# Patient Record
Sex: Male | Born: 1935 | Race: White | Hispanic: No | Marital: Married | Smoking: Former smoker
Health system: Southern US, Community
[De-identification: ages and names within clinical notes are randomized; demographics above are authoritative.]

## PROBLEM LIST (undated history)

## (undated) DIAGNOSIS — I10 Essential (primary) hypertension: Secondary | ICD-10-CM

## (undated) DIAGNOSIS — D649 Anemia, unspecified: Secondary | ICD-10-CM

## (undated) DIAGNOSIS — E039 Hypothyroidism, unspecified: Secondary | ICD-10-CM

## (undated) DIAGNOSIS — K219 Gastro-esophageal reflux disease without esophagitis: Secondary | ICD-10-CM

## (undated) DIAGNOSIS — M4800 Spinal stenosis, site unspecified: Secondary | ICD-10-CM

## (undated) DIAGNOSIS — I499 Cardiac arrhythmia, unspecified: Secondary | ICD-10-CM

## (undated) DIAGNOSIS — E785 Hyperlipidemia, unspecified: Secondary | ICD-10-CM

## (undated) DIAGNOSIS — M1990 Unspecified osteoarthritis, unspecified site: Secondary | ICD-10-CM

## (undated) DIAGNOSIS — C159 Malignant neoplasm of esophagus, unspecified: Secondary | ICD-10-CM

## (undated) DIAGNOSIS — R011 Cardiac murmur, unspecified: Secondary | ICD-10-CM

## (undated) DIAGNOSIS — K5792 Diverticulitis of intestine, part unspecified, without perforation or abscess without bleeding: Secondary | ICD-10-CM

## (undated) DIAGNOSIS — R739 Hyperglycemia, unspecified: Secondary | ICD-10-CM

## (undated) DIAGNOSIS — I2510 Atherosclerotic heart disease of native coronary artery without angina pectoris: Secondary | ICD-10-CM

## (undated) DIAGNOSIS — Z8719 Personal history of other diseases of the digestive system: Secondary | ICD-10-CM

## (undated) HISTORY — PX: CHOLECYSTECTOMY: SHX55

## (undated) HISTORY — PX: BACK SURGERY: SHX140

## (undated) HISTORY — PX: JOINT REPLACEMENT: SHX530

## (undated) HISTORY — PX: ROTATOR CUFF REPAIR: SHX139

## (undated) HISTORY — PX: EYE SURGERY: SHX253

---

## 2006-03-15 ENCOUNTER — Ambulatory Visit: Payer: Self-pay | Admitting: Cardiology

## 2007-08-27 ENCOUNTER — Inpatient Hospital Stay: Payer: Self-pay | Admitting: Internal Medicine

## 2008-02-09 ENCOUNTER — Ambulatory Visit: Payer: Self-pay | Admitting: Gastroenterology

## 2010-02-23 ENCOUNTER — Ambulatory Visit: Payer: Self-pay | Admitting: Ophthalmology

## 2010-03-06 ENCOUNTER — Ambulatory Visit: Payer: Self-pay | Admitting: Ophthalmology

## 2012-05-09 LAB — BASIC METABOLIC PANEL
Anion Gap: 8 (ref 7–16)
Chloride: 100 mmol/L (ref 98–107)
EGFR (African American): >60
EGFR (Non-African Amer.): >60
Glucose: 181 mg/dL — ABNORMAL HIGH (ref 65–99)

## 2012-05-09 LAB — CBC
Platelet: 332 10*3/uL (ref 150–440)
RBC: 3.42 10*6/uL — ABNORMAL LOW (ref 4.40–5.90)
RDW: 13 % (ref 11.5–14.5)
WBC: 10.8 10*3/uL — ABNORMAL HIGH (ref 3.8–10.6)

## 2012-05-09 LAB — TROPONIN I: Troponin-I: <0.02 ng/mL

## 2012-05-10 ENCOUNTER — Observation Stay: Payer: Self-pay | Admitting: Internal Medicine

## 2012-05-10 LAB — CK TOTAL AND CKMB (NOT AT ARMC)
CK, Total: 97 U/L (ref 35–232)
CK-MB: 1.2 ng/mL (ref 0.5–3.6)

## 2012-05-10 LAB — TROPONIN I
Troponin-I: <0.02 ng/mL
Troponin-I: <0.02 ng/mL

## 2012-05-11 LAB — BASIC METABOLIC PANEL
BUN: 18 mg/dL (ref 7–18)
Calcium, Total: 9 mg/dL (ref 8.5–10.1)
Co2: 26 mmol/L (ref 21–32)
Creatinine: 1.35 mg/dL — ABNORMAL HIGH (ref 0.60–1.30)
EGFR (Non-African Amer.): 51 — ABNORMAL LOW
Glucose: 99 mg/dL (ref 65–99)
Osmolality: 278 (ref 275–301)
Sodium: 138 mmol/L (ref 136–145)

## 2012-05-11 LAB — CBC WITH DIFFERENTIAL/PLATELET
Basophil %: 0.2 %
Eosinophil %: 2.1 %
HGB: 10.4 g/dL — ABNORMAL LOW (ref 13.0–18.0)
MCH: 32.9 pg (ref 26.0–34.0)
MCHC: 34.5 g/dL (ref 32.0–36.0)
MCV: 95 fL (ref 80–100)
Monocyte #: 1.1 x10 3/mm — ABNORMAL HIGH (ref 0.2–1.0)
Monocyte %: 11.7 %
Neutrophil #: 6.2 10*3/uL (ref 1.4–6.5)
Neutrophil %: 65.4 %
Platelet: 335 10*3/uL (ref 150–440)
RBC: 3.15 10*6/uL — ABNORMAL LOW (ref 4.40–5.90)
WBC: 9.4 10*3/uL (ref 3.8–10.6)

## 2012-05-11 LAB — LIPID PANEL
Cholesterol: 96 mg/dL (ref 0–200)
HDL Cholesterol: 32 mg/dL — ABNORMAL LOW (ref 40–60)
Triglycerides: 77 mg/dL (ref 0–200)

## 2012-05-15 LAB — CULTURE, BLOOD (SINGLE)

## 2012-06-17 ENCOUNTER — Ambulatory Visit: Payer: Self-pay | Admitting: Gastroenterology

## 2014-08-13 NOTE — Consult Note (Signed)
Brief Consult Note: Diagnosis: Neck and thoracic back pain.   Patient was seen by consultant.   Consult note dictated.   Recommend further assessment or treatment.   Comments: Patient has had pain in his neck and thoracic spine for 5 days.  No known trauma.  Has chronic lumbar back pain treated at a pain management center with steroid injections without relief.  He denies weakness or paresthesias in the upper extremities.  ROM of c spine is limited per the patient and his thoracic pain is exacerbated with deep breathing.  No tenderness midline but rather tenderness is present in the paraspinal muscles bilaterally along the cervical and thoracic spine.  Symptoms may be related to strain, degenerative disc disease seen on cspine plain films or myalgias related to recent URI.  Recommend patient take an anti-inflammatory or steroid taper to help improve symptoms.  PT can be helpful to work on neck ROM.  Electronic Signatures: Thornton Park (MD)  (Signed 18-Jan-14 13:23)  Authored: Brief Consult Note   Last Updated: 18-Jan-14 13:23 by Thornton Park (MD)

## 2014-08-13 NOTE — Consult Note (Signed)
PATIENT NAME:  Eddie Castaneda, Eddie Castaneda MR#:  [id number] DATE OF BIRTH:  07/08/1933  DATE OF CONSULTATION:  05/10/2012  REFERRING PHYSICIAN:   CONSULTING PHYSICIAN:  Dionisio David, MD  PATIENT NAME: Eddie Castaneda.   CONSULTING PHYSICIAN: Dionisio David, M.D.   INDICATION FOR CONSULTATION:  Chest pain.   HISTORY OF PRESENT ILLNESS:  This is a 81 year old white male who came into the hospital with chest pain. I was asked to evaluate the patient because of severe chest pain. The patient states that he actually has been having back pain and intrascapular area pain, as well as some dull ache in the right side of his chest when he takes deep breaths. In fact, he was out of the country and came home and developed rectal bleeding, collapsed and had been diagnosed of GI bleed and was seen by Dr. Ramonita Lab and because of chest pain, in the meantime, he got admitted to the hospital. He denies any left-sided chest pain at this time but does have some right and retrosternal area chest pain when he takes deep breath. No pressure-type chest pain. It is not related to activity.  The past medical history was evaluated by Dr. Saralyn Pilar in the past several years ago and had according to him 50% blockage in 2 arteries. He was last seen by Dr. Saralyn Pilar in December and he was told to come back in a year.   PAST MEDICAL HISTORY:  Besides moderate coronary artery disease, has a history of hypertension. No history of hypercholesterolemia or diabetes. As mentioned, he had just had recently rectal bleed.   SOCIAL HISTORY:  Quit smoking several years ago.   FAMILY HISTORY:  Positive for coronary artery disease.   PHYSICAL EXAMINATION: GENERAL:  He is alert, oriented x 3, in no acute distress.  VITAL SIGNS:  Stable.  HEENT:  Revealed no JVD. LUNGS:  Clear.   HEART:  Regular rate and rhythm. Normal S1, S2. No audible murmur.  ABDOMEN:  Soft, nontender, positive bowel sounds.  EXTREMITIES:  No pedal edema.   NEUROLOGIC:  Appears to be intact.   STUDIES:  EKG shows normal sinus rhythm, 96 beats per minute, nonspecific ST-T changes.   LABORATORY DATA: BUN and creatinine are normal. His glucose is 181. Hemoglobin is 11. Troponin is less than 0.02.   ASSESSMENT AND PLAN:  Atypical chest pain sounds like pleuritic-type of chest pain to me, which is most likely either musculoskeletal or related to underlying maybe bronchitis. He had upper respiratory infection symptoms when he came back from vacation and had rectal bleed but his hemoglobin is 11 right now. This EKG is unremarkable. Cardiac enzymes are negative. He has been evaluated by Dr. Saralyn Pilar in the past. Advised no further workup and just follow up with Dr. Saralyn Pilar. Sounds like musculoskeletal chest pain.     ____________________________ Dionisio David, MD sak:ce D: 05/10/2012 10:04:00 ET T: 05/10/2012 10:33:09 ET JOB#: [id number]  cc: Dionisio David, MD, <Dictator> Dionisio David MD ELECTRONICALLY SIGNED 06/09/2012 9:02

## 2014-08-13 NOTE — Consult Note (Signed)
PATIENT NAME:  Eddie Castaneda, Eddie Castaneda MR#:  [id number] DATE OF BIRTH:  05-Aug-1933  DATE OF CONSULTATION:  05/10/2012  REFERRING PHYSICIAN:   CONSULTING PHYSICIAN:  Timoteo Gaul, MD  REASON FOR CONSULTATION:  Neck and thoracic back pain.   HISTORY OF PRESENT ILLNESS:  The patient is a 81 year old male who is admitted with pain in his chest, back and neck.  The patient states that these symptoms have been going on for approximately 5 days.  He was admitted to the hospital overnight.  The patient states that he was in his usual state of health until returning from [place] on vacation.  On 04/27/2012, he developed an upper respiratory illness and collapsed.  Soon after, he also had a couple days of rectal bleeding.  This had all resolved and the patient was beginning to feel better when he spontaneously developed neck, chest and thoracic back pain which was exacerbated with taking a deep breath.  The patient denies any significant difficulty breathing, however.  There was no recent known injury to the neck or back.  Of note, patient does have chronic low back pain.  He was treated by Dr. Primus Bravo here at [facility] in the pain management department.  The patient has undergone previous corticosteroid injections in his lumbar spine which did not provide him with relief.  The patient takes tramadol at baseline for this.  He is a patient of Dr. Olin Pia.   I reviewed the patient's medical history by his electronic medical record.   PHYSICAL EXAMINATION: GENERAL APPEARANCE:  The patient is alert, oriented, in no acute distress.  He is sitting up in a chair in his hospital room.  The patient has no outward signs of trauma.  The skin overlying his neck and back are intact.  There is no erythema, ecchymosis or swelling.  His range of motion within the cervical spine is limited.  He has approximately 40 degrees of extension, 45 degrees of flexion as well as 45 degrees of rotation to the  right and left.  These symptoms do not cause radicular pain into his upper extremities.  He does not have significant tenderness over the posterior spinous processes of the cervical spine, but has tenderness along the spine on both the right and left sides to palpation diffusely.  There is no palpable masses.  The patient is able to move his upper extremities without pain.  He has intact sensation to light touch throughout the bilateral upper extremities and palpable radial pulses.  The patient did demonstrate some external rotation weakness on the right side compared to the left, but he states this is due to a previous rotator cuff injury.  The patient has 5/5 strength of his subscapularis and deltoid muscles.   His thoracic spine also does not have midline tenderness over the posterior spinous processes, but he has diffuse paraspinal tenderness on either side of his thoracic spine.  He did not have a specific tenderness in the lumbar spine within the midline or the paraspinal muscles.  The pain seems to stop at approximately the junction between the thoracolumbar spine.    RADIOLOGIC STUDIES:  I have reviewed the AP and lateral of the thoracic spine.  This does not show evidence of a significant degenerative change or spondylolisthesis.  There is no compression fracture. The patient appears to have normal kyphosis.  His cervical films, however, demonstrate significant degenerative changes throughout the cervical spine.  He has narrowing of disk spaces between C5  and 6 and C6 and 7.  There is anterior spurring and mild anterolisthesis between C4 and 5. No compression fractures are noted.   ASSESSMENT:  Paraspinal muscle pain involving the cervical and thoracic spine, possibly strain versus myalgias.   PLAN:  The patient is not having any weakness or paresthesias in the upper extremities.  I do not believe he is suffering from a herniated disk at this time.  His neck pain may be coming from a flare-up of the  underlying degenerative disk disease within the cervical spine, but his exam suggests a more muscular origin to the cervical and thoracic spine symptoms.  I would recommend a trial of Flexeril 10 mg q. 8 hours p.r.n. for spasm as well as an oral anti-inflammatory versus a steroid taper.  Given that he is on antibiotics currently, I have elected to start him on Celebrex 200 mg twice daily.  A soft collar may benefit the patient if he does not respond to the medications and needs more mechanical support for his cervical spine.  He would also benefit from a physical therapy evaluation so they can work on range of motion of his C-spine and work on paraspinal muscular strengthening.       ____________________________ Timoteo Gaul, MD klk:ea D: 05/10/2012 13:48:00 ET T: 05/11/2012 04:01:43 ET JOB#: [id number]  cc: Timoteo Gaul, MD, <Dictator> Timoteo Gaul MD ELECTRONICALLY SIGNED 05/19/2012 17:09

## 2014-08-13 NOTE — H&P (Signed)
PATIENT NAME:  Eddie Castaneda, Eddie Castaneda MR#:  [id number] DATE OF BIRTH:  1933/07/31  DATE OF ADMISSION:  05/10/2012  PRIMARY CARE PHYSICIAN: Ramonita Lab, MD  REFERRING PHYSICIAN: Dr. Owens Shark  CHIEF COMPLAINT: Shortness of breath associated with chest pain and upper back pain.   HISTORY OF PRESENT ILLNESS: The patient is reporting that since Tuesday he has been having upper back pain which is new. It is radiating to the middle of his chest. The patient had some acute viral illness last week and he was evaluated by his primary care physician and was given some medications including antibiotics, ciprofloxacin and Flagyl. As the patient is complaining of shortness of breath with deep inspirations, CT angiogram of the chest was done and pulmonary embolism is ruled out. Initial EKG is with no acute ST-T wave changes and cardiac enzymes are negative. The patient denies any photophobia, nausea or vomiting. He has been followed up by Dr. Saralyn Pilar as an outpatient and his last visit was in December 2013, which was normal.   PAST MEDICAL HISTORY: 1. Hypertension. 2. Hyperlipidemia. 3. Arthritis. 4. Spinal stenosis with bulging disk.   PAST SURGICAL HISTORY:  1. Knee replacement. 2. Back surgery. 3. Partial laminectomy in year 1982. 4. Cholecystectomy in year 2003. 5. Rotator cuff repair.   ALLERGIES: MORPHINE.    HOME MEDICATIONS: 1. Tylenol 1 to 2 tablets as needed. 2. Quinapril 40 mg once daily. 3. Prilosec 20 mg 1 tablet once daily. 4. Niaspan ER 1000 mg twice a day. 5. Metoprolol 20 mg p.o. 2 times a day. 6. Lisinopril 10 mg once daily. 7. Flagyl 500 mg p.o. q. 8 hours for 7 days, given by the patient's primary care physician.  8. Fiber tab. The patient needs to resume this in one week.  PSYCHOSOCIAL HISTORY: He lives at home with wife. Denies smoking, alcohol or illicit drug use.   FAMILY HISTORY: Mother had colon cancer and father had brain cancer.   REVIEW OF SYSTEMS:  CONSTITUTIONAL:  Denies any fevers, fatigue or weakness. Complaining of pain in the right side of the chest with deep inspiration. Denies any weight loss or weight gain.   EYES: Denies blurry vision, inflammation, glaucoma, cataracts, allergies surgery.  RESPIRATORY: Denies any COPD. Denies any pneumonia.   CARDIOVASCULAR: Denies palpitations.   BREAST/GYN: Denies any breast mass or vaginal discharge.   ENDOCRINOLOGY: Denies any polyuria, polyphagia, polydipsia or nocturia. Denies any thyroid problems.   HEMATOLOGIC/LYMPHATIC: Denies anemia, thrombocytopenia.   MUSCULOSKELETAL: Complaining of pain in the upper back, in between the shoulder blades. Denies any knee pain or hip pain.   NEUROLOGIC: Denies any numbness, weakness, dysarthria, dementia or ataxia.   PSYCH: Denies any insomnia, ADD or OCD.   PHYSICAL EXAMINATION:  VITAL SIGNS: Temperature 98.6, pulse 95, respirations 18 and blood pressure 195/102.   GENERAL APPEARANCE: Not in acute distress, very uncomfortable from the pain in the shoulder blades, moderately built and moderately nourished.   HEENT: Normocephalic, atraumatic. Pupils are equally reacting to light and accommodation. No cyanosis or tenderness. No nasal congestion. No post nasal drip.   NECK: Supple. No JVD. No carotid bruits. No thyromegaly.   PULMONARY: Lungs have mild rales and rhonchi.   CARDIOVASCULAR: S1 and S2 normal. Regular rate and rhythm. Point of maximum impulse is intact. No palpable anterior chest wall tenderness.  ABDOMEN: Soft. Bowel sounds are positive in all four quadrants. Nontender, nondistended.   NEURO: Alert and oriented x 3. Motor and sensory are grossly intact. Reflexes are 2+.  EXTREMITIES: No edema. No cyanosis.  LABS/RADIOLOGIC STUDIES: CT angiogram of the chest has revealed unremarkable pulmonary arteries and no pulmonary embolism. 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 No pulmonary embolism. Incidental nonacute findings.    Glucose is 181, BUN 18, creatinine 1.14, sodium 134, potassium 4.2, chloride 100, CO2 26, anion gap 8 and GFR greater than 60. CK total 55, CK-MB 0.8 and troponin less than 0.02. WBC is 10.8, hemoglobin 11.0, hematocrit 32.8 and platelet count 332,000. Influenza A and B are negative.  ASSESSMENT AND PLAN: This is a 81 year old coming in with upper back pain radiating to the middle of the chest.  1. Chest pain and shortness of breath. Rule out acute MI. The patient is started on oxygen, nitroglycerin, aspirin, beta blocker and nitroglycerin tablet sublingually q. 5 minutes as needed for chest pain x 3. Cycle cardiac biomarkers. Cardiology consult is placed.  2. Hypertension, uncontrolled. Increase metoprolol dose to 50 mg p.o. q. 12 hours and give Lopressor 5 mg IV on an as needed basis for systolic blood pressure greater than 150.  3. Upper back pain, which is acute in nature. We will get an x-ray of the upper back and consult orthopedics.   CODE STATUS: FULL CODE.  The diagnosis and plan of care was discussed in detail with the patient and the family members at bedside. They voiced their understanding of the plan.   The patient will be turned over to [facility] in the a.m.  TOTAL TIME SPENT ON ADMISSION: 60 minutes. ____________________________ Nicholes Mango, MD ag:sb D: 05/10/2012 03:34:29 ET T: 05/10/2012 15:59:07 ET JOB#: [id number]  cc: Nicholes Mango, MD, <Dictator> Isaias Cowman, MD Adin Hector, MD Nicholes Mango MD ELECTRONICALLY SIGNED 05/24/2012 5:20

## 2014-08-13 NOTE — Discharge Summary (Signed)
PATIENT NAME:  Eddie Castaneda, Eddie Castaneda MR#:  [id number] DATE OF BIRTH:  04/19/1934  DATE OF ADMISSION:  05/10/2012 DATE OF DISCHARGE:  05/11/2012  DISCHARGE DIAGNOSES:  1.  Atypical chest and back pain, most likely musculoskeletal.  2.  Degenerative arthritis involving cervical spine.  3.  Hypertension.  4.  Recent diverticulitis.   CHIEF COMPLAINT: Shortness of breath associated with chest, upper back and neck pain.   HISTORY OF PRESENT ILLNESS: Eddie Castaneda is a 81 year old male with a history of hypertension, hyperlipidemia, osteoarthritis, spinal stenosis and bulging disk, who presented to the ER complaining of upper back pain radiating to the middle of the chest.  The pain was worse when he took deep breaths. He also has been having neck pain and some shortness of breath. The patient underwent a CT angiogram of the chest that was negative for PE. Initial EKG did not show any ST-T changes. Cardiac enzymes were negative. The patient had recently been treated with a course of Cipro and Flagyl for presumed diverticulitis.   PAST MEDICAL HISTORY: Significant for hypertension, hyperlipidemia, arthritis, spinal stenosis, and bulging disk.   PAST SURGICAL HISTORY: Significant for knee replacement, back surgery, partial laminectomy, cholecystectomy, rotator cuff repair.   PHYSICAL EXAMINATION: GENERAL:  Appeared to be in mild distress.  VITAL SIGNS:  Temperature was 98.6, pulse 95, respirations 18, blood pressure initially was 194/102.  HEENT: Normocephalic, atraumatic.  LUNGS: Clear to auscultation.  HEART: S1, S2.  ABDOMEN: Soft, nontender.  EXTREMITIES: No edema.   DIAGNOSTIC DATA:  Glucose 181, BUN 18, creatinine 1.14, sodium 134, potassium 4.0, chloride 100, CO2 at 26.  WBC count 10.8, hemoglobin 11, hematocrit 32.8.   HOSPITAL COURSE:  The patient was seen in consultation by Dr. Mack Guise, Orthopedics, and also by Dr. Humphrey Rolls, cardiologist who felt that the pain was more likely to be  musculoskeletal. Cervical spine x-ray showed degenerative disk disease in the lower cervical spine with minimal anterolisthesis of C4 and C5, most likely degenerative.  Thoracic spine x-ray did not show any acute findings. The patient was also started on Celebrex and cyclobenzaprine p.r.n.  He was given Percocet for pain relief. He started also metoprolol 50 mg q.12 hours for hypertension and was advised to complete his course of Cipro and Flagyl.  During his stay in the hospital, patient symptomatically improved and he was advised to hold his Lipitor for a few weeks to see if the pain got better and was discharged home in stable condition.   DISCHARGE MEDICATIONS:  1.  Quinapril 40 mg once daily. 2.  Metoprolol 25 mg p.o. b.i.d.  3.  Flomax 0.4 mg once daily. 4.  Vitamin C 1000 mg once daily.  5.  Vitamin B12 1500 mcg once daily. 6.  Celebrex 200 mg b.i.d. for 10 days.  7.  Aspirin 325 mg once a day.  8.  Percocet 5/325, 1 tablet p.o. b.i.d. p.r.n. for 10 days.   FOLLOWUP: The patient has been advised to hold his Lipitor and follow up with Dr. Caryl Comes at the [facility] in 1 to 2 weeks' time.   Total time spent in discharging patient : 30 minutes   ____________________________ Tracie Harrier, MD vh:eg D: 05/11/2012 13:53:11 ET T: 05/11/2012 19:54:43 ET JOB#: [id number]  cc: Tracie Harrier, MD, <Dictator> Tracie Harrier MD ELECTRONICALLY SIGNED 06/03/2012 8:41

## 2014-11-04 ENCOUNTER — Encounter
Admission: RE | Admit: 2014-11-04 | Discharge: 2014-11-04 | Disposition: A | Discharge status: Home / Self Care | Payer: Medicare Other | Source: Ambulatory Visit | Attending: Ophthalmology | Admitting: Ophthalmology

## 2014-11-04 DIAGNOSIS — I10 Essential (primary) hypertension: Secondary | ICD-10-CM | POA: Diagnosis not present

## 2014-11-04 DIAGNOSIS — Z01810 Encounter for preprocedural cardiovascular examination: Secondary | ICD-10-CM | POA: Diagnosis not present

## 2014-11-04 DIAGNOSIS — H2512 Age-related nuclear cataract, left eye: Secondary | ICD-10-CM | POA: Diagnosis not present

## 2014-11-05 ENCOUNTER — Encounter: Payer: Self-pay | Admitting: *Deleted

## 2014-11-08 ENCOUNTER — Ambulatory Visit: Payer: Medicare Other | Admitting: Anesthesiology

## 2014-11-08 ENCOUNTER — Ambulatory Visit
Admission: RE | Admit: 2014-11-08 | Discharge: 2014-11-08 | Disposition: A | Discharge status: Home / Self Care | Payer: Medicare Other | Source: Ambulatory Visit | Attending: Ophthalmology | Admitting: Ophthalmology

## 2014-11-08 ENCOUNTER — Encounter: Payer: Self-pay | Admitting: *Deleted

## 2014-11-08 ENCOUNTER — Encounter
Admission: RE | Disposition: A | Discharge status: Home / Self Care | Payer: Self-pay | Source: Ambulatory Visit | Attending: Ophthalmology

## 2014-11-08 DIAGNOSIS — K589 Irritable bowel syndrome without diarrhea: Secondary | ICD-10-CM | POA: Diagnosis not present

## 2014-11-08 DIAGNOSIS — Z85828 Personal history of other malignant neoplasm of skin: Secondary | ICD-10-CM | POA: Diagnosis not present

## 2014-11-08 DIAGNOSIS — Z9849 Cataract extraction status, unspecified eye: Secondary | ICD-10-CM | POA: Diagnosis not present

## 2014-11-08 DIAGNOSIS — Z7982 Long term (current) use of aspirin: Secondary | ICD-10-CM | POA: Diagnosis not present

## 2014-11-08 DIAGNOSIS — Z96659 Presence of unspecified artificial knee joint: Secondary | ICD-10-CM | POA: Insufficient documentation

## 2014-11-08 DIAGNOSIS — Z885 Allergy status to narcotic agent status: Secondary | ICD-10-CM | POA: Diagnosis not present

## 2014-11-08 DIAGNOSIS — Z79891 Long term (current) use of opiate analgesic: Secondary | ICD-10-CM | POA: Diagnosis not present

## 2014-11-08 DIAGNOSIS — Z9049 Acquired absence of other specified parts of digestive tract: Secondary | ICD-10-CM | POA: Insufficient documentation

## 2014-11-08 DIAGNOSIS — G8929 Other chronic pain: Secondary | ICD-10-CM | POA: Diagnosis not present

## 2014-11-08 DIAGNOSIS — Z87891 Personal history of nicotine dependence: Secondary | ICD-10-CM | POA: Diagnosis not present

## 2014-11-08 DIAGNOSIS — I10 Essential (primary) hypertension: Secondary | ICD-10-CM | POA: Insufficient documentation

## 2014-11-08 DIAGNOSIS — M1990 Unspecified osteoarthritis, unspecified site: Secondary | ICD-10-CM | POA: Diagnosis not present

## 2014-11-08 DIAGNOSIS — M4800 Spinal stenosis, site unspecified: Secondary | ICD-10-CM | POA: Insufficient documentation

## 2014-11-08 DIAGNOSIS — H269 Unspecified cataract: Secondary | ICD-10-CM | POA: Diagnosis present

## 2014-11-08 DIAGNOSIS — I2510 Atherosclerotic heart disease of native coronary artery without angina pectoris: Secondary | ICD-10-CM | POA: Diagnosis not present

## 2014-11-08 DIAGNOSIS — Z79899 Other long term (current) drug therapy: Secondary | ICD-10-CM | POA: Diagnosis not present

## 2014-11-08 DIAGNOSIS — Z9889 Other specified postprocedural states: Secondary | ICD-10-CM | POA: Diagnosis not present

## 2014-11-08 DIAGNOSIS — H2512 Age-related nuclear cataract, left eye: Secondary | ICD-10-CM | POA: Insufficient documentation

## 2014-11-08 HISTORY — DX: Anemia, unspecified: D64.9

## 2014-11-08 HISTORY — DX: Diverticulitis of intestine, part unspecified, without perforation or abscess without bleeding: K57.92

## 2014-11-08 HISTORY — DX: Atherosclerotic heart disease of native coronary artery without angina pectoris: I25.10

## 2014-11-08 HISTORY — PX: CATARACT EXTRACTION W/PHACO: SHX586

## 2014-11-08 HISTORY — DX: Gastro-esophageal reflux disease without esophagitis: K21.9

## 2014-11-08 HISTORY — DX: Personal history of other diseases of the digestive system: Z87.19

## 2014-11-08 HISTORY — DX: Cardiac arrhythmia, unspecified: I49.9

## 2014-11-08 HISTORY — DX: Essential (primary) hypertension: I10

## 2014-11-08 HISTORY — DX: Spinal stenosis, site unspecified: M48.00

## 2014-11-08 HISTORY — DX: Unspecified osteoarthritis, unspecified site: M19.90

## 2014-11-08 SURGERY — PHACOEMULSIFICATION, CATARACT, WITH IOL INSERTION
Anesthesia: General | Site: Eye | Laterality: Left | Wound class: Clean

## 2014-11-08 MED ORDER — MOXIFLOXACIN HCL 0.5 % OP SOLN
1.0000 [drp] | OPHTHALMIC | Status: AC
  Administered 2014-11-08 (×3): 1 [drp] via OPHTHALMIC

## 2014-11-08 MED ORDER — NA CHONDROIT SULF-NA HYALURON 40-17 MG/ML IO SOLN
INTRAOCULAR | Status: AC
  Filled 2014-11-08: qty 1

## 2014-11-08 MED ORDER — EPINEPHRINE HCL 1 MG/ML IJ SOLN
INTRAMUSCULAR | Status: AC
  Filled 2014-11-08: qty 2

## 2014-11-08 MED ORDER — LIDOCAINE HCL (PF) 4 % IJ SOLN
INTRAMUSCULAR | Status: DC | PRN
  Administered 2014-11-08: 5 mL via OPHTHALMIC

## 2014-11-08 MED ORDER — HYALURONIDASE HUMAN 150 UNIT/ML IJ SOLN
INTRAMUSCULAR | Status: AC
  Filled 2014-11-08: qty 1

## 2014-11-08 MED ORDER — MOXIFLOXACIN HCL 0.5 % OP SOLN
OPHTHALMIC | Status: DC | PRN
  Administered 2014-11-08: 1 [drp] via OPHTHALMIC

## 2014-11-08 MED ORDER — PHENYLEPHRINE HCL 10 % OP SOLN
1.0000 [drp] | OPHTHALMIC | Status: AC
  Administered 2014-11-08 (×4): 1 [drp] via OPHTHALMIC

## 2014-11-08 MED ORDER — MOXIFLOXACIN HCL 0.5 % OP SOLN
OPHTHALMIC | Status: AC
  Administered 2014-11-08: 1 [drp] via OPHTHALMIC
  Filled 2014-11-08: qty 3

## 2014-11-08 MED ORDER — CYCLOPENTOLATE HCL 2 % OP SOLN
1.0000 [drp] | OPHTHALMIC | Status: AC
  Administered 2014-11-08 (×4): 1 [drp] via OPHTHALMIC

## 2014-11-08 MED ORDER — NA CHONDROIT SULF-NA HYALURON 40-17 MG/ML IO SOLN
INTRAOCULAR | Status: DC | PRN
  Administered 2014-11-08: 1 mL via INTRAOCULAR

## 2014-11-08 MED ORDER — TETRACAINE HCL 0.5 % OP SOLN
OPHTHALMIC | Status: DC | PRN
  Administered 2014-11-08: 1 [drp] via OPHTHALMIC

## 2014-11-08 MED ORDER — SODIUM CHLORIDE 0.9 % IV SOLN
INTRAVENOUS | Status: DC
  Administered 2014-11-08 (×2): via INTRAVENOUS

## 2014-11-08 MED ORDER — BUPIVACAINE HCL (PF) 0.75 % IJ SOLN
INTRAMUSCULAR | Status: AC
  Filled 2014-11-08: qty 10

## 2014-11-08 MED ORDER — CYCLOPENTOLATE HCL 2 % OP SOLN
OPHTHALMIC | Status: AC
  Administered 2014-11-08: 1 [drp] via OPHTHALMIC
  Filled 2014-11-08: qty 2

## 2014-11-08 MED ORDER — TETRACAINE HCL 0.5 % OP SOLN
OPHTHALMIC | Status: AC
  Filled 2014-11-08: qty 2

## 2014-11-08 MED ORDER — CEFUROXIME OPHTHALMIC INJECTION 1 MG/0.1 ML
INJECTION | OPHTHALMIC | Status: DC | PRN
  Administered 2014-11-08: 1 mg via INTRACAMERAL

## 2014-11-08 MED ORDER — MIDAZOLAM HCL 2 MG/2ML IJ SOLN
INTRAMUSCULAR | Status: DC | PRN
  Administered 2014-11-08: 2 mg via INTRAVENOUS

## 2014-11-08 MED ORDER — CARBACHOL 0.01 % IO SOLN
INTRAOCULAR | Status: DC | PRN
  Administered 2014-11-08: 0.5 mL via INTRAOCULAR

## 2014-11-08 MED ORDER — EPINEPHRINE HCL 1 MG/ML IJ SOLN
INTRAOCULAR | Status: DC | PRN
  Administered 2014-11-08: 200 mL

## 2014-11-08 MED ORDER — PHENYLEPHRINE HCL 10 % OP SOLN
OPHTHALMIC | Status: AC
  Administered 2014-11-08: 1 [drp] via OPHTHALMIC
  Filled 2014-11-08: qty 5

## 2014-11-08 MED ORDER — LIDOCAINE HCL (PF) 1 % IJ SOLN
INTRAOCULAR | Status: DC | PRN
  Administered 2014-11-08: 12:00:00 via OPHTHALMIC

## 2014-11-08 MED ORDER — CEFUROXIME OPHTHALMIC INJECTION 1 MG/0.1 ML
INJECTION | OPHTHALMIC | Status: AC
  Filled 2014-11-08: qty 0.1

## 2014-11-08 MED ORDER — ALFENTANIL 500 MCG/ML IJ INJ
INJECTION | INTRAMUSCULAR | Status: DC | PRN
  Administered 2014-11-08: 1000 ug via INTRAVENOUS

## 2014-11-08 MED ORDER — LIDOCAINE HCL (PF) 4 % IJ SOLN
INTRAMUSCULAR | Status: AC
  Filled 2014-11-08: qty 5

## 2014-11-08 SURGICAL SUPPLY — 24 items
CORD BIP STRL DISP 12FT (MISCELLANEOUS) ×3 IMPLANT
DRAPE XRAY CASSETTE 23X24 (DRAPES) ×3 IMPLANT
ERASER HMR WETFIELD 18G (MISCELLANEOUS) ×3 IMPLANT
GLOVE BIO SURGEON STRL SZ8 (GLOVE) ×3 IMPLANT
GLOVE SURG LX 6.5 MICRO (GLOVE) ×2
GLOVE SURG LX 8.0 MICRO (GLOVE) ×2
GLOVE SURG LX STRL 6.5 MICRO (GLOVE) ×1 IMPLANT
GLOVE SURG LX STRL 8.0 MICRO (GLOVE) ×1 IMPLANT
GOWN STRL REUS W/ TWL LRG LVL3 (GOWN DISPOSABLE) ×1 IMPLANT
GOWN STRL REUS W/ TWL XL LVL3 (GOWN DISPOSABLE) ×1 IMPLANT
GOWN STRL REUS W/TWL LRG LVL3 (GOWN DISPOSABLE) ×2
GOWN STRL REUS W/TWL XL LVL3 (GOWN DISPOSABLE) ×2
LENS IOL ACRYSERT 18.0 (Intraocular Lens) ×3 IMPLANT
PACK CATARACT (MISCELLANEOUS) ×3 IMPLANT
PACK CATARACT DINGLEDEIN LX (MISCELLANEOUS) ×3 IMPLANT
PACK EYE AFTER SURG (MISCELLANEOUS) ×3 IMPLANT
SHLD EYE VISITEC  UNIV (MISCELLANEOUS) ×3 IMPLANT
SOL PREP PVP 2OZ (MISCELLANEOUS) ×3
SOLUTION PREP PVP 2OZ (MISCELLANEOUS) ×1 IMPLANT
SUT SILK 5-0 (SUTURE) ×3 IMPLANT
SYR 5ML LL (SYRINGE) ×3 IMPLANT
SYR TB 1ML 27GX1/2 LL (SYRINGE) ×3 IMPLANT
WATER STERILE IRR 1000ML POUR (IV SOLUTION) ×3 IMPLANT
WIPE NON LINTING 3.25X3.25 (MISCELLANEOUS) ×3 IMPLANT

## 2014-11-08 NOTE — H&P (Signed)
  History and physical was faxed and scanned in.   

## 2014-11-08 NOTE — Discharge Instructions (Addendum)
See handout.AMBULATORY SURGERY  DISCHARGE INSTRUCTIONS   1) The drugs that you were given will stay in your system until tomorrow so for the next 24 hours you should not:  A) Drive an automobile B) Make any legal decisions C) Drink any alcoholic beverage   2) You may resume regular meals tomorrow.  Today it is better to start with liquids and gradually work up to solid foods.  You may eat anything you prefer, but it is better to start with liquids, then soup and crackers, and gradually work up to solid foods.   3) Please notify your doctor immediately if you have any unusual bleeding, trouble breathing, redness and pain at the surgery site, drainage, fever, or pain not relieved by medication.    4) Additional Instructions:   Cataract Surgery Care After Refer to this sheet in the next few weeks. These instructions provide you with information on caring for yourself after your procedure. Your caregiver may also give you more specific instructions. Your treatment has been planned according to current medical practices, but problems sometimes occur. Call your caregiver if you have any problems or questions after your procedure.  HOME CARE INSTRUCTIONS   Avoid strenuous activities as directed by your caregiver.  Ask your caregiver when you can resume driving.  Use eyedrops or other medicines to help healing and control pressure inside your eye as directed by your caregiver.  Only take over-the-counter or prescription medicines for pain, discomfort, or fever as directed by your caregiver.  Do not to touch or rub your eyes.  You may be instructed to use a protective shield during the first few days and nights after surgery. If not, wear sunglasses to protect your eyes. This is to protect the eye from pressure or from being accidentally bumped.  Keep the area around your eye clean and dry. Avoid swimming or allowing water to hit you directly in the face while showering. Keep soap and  shampoo out of your eyes.  Do not bend or lift heavy objects. Bending increases pressure in the eye. You can walk, climb stairs, and do light household chores.  Do not put a contact lens into the eye that had surgery until your caregiver says it is okay to do so.  Ask your doctor when you can return to work. This will depend on the kind of work that you do. If you work in a dusty environment, you may be advised to wear protective eyewear for a period of time.  Ask your caregiver when it will be safe to engage in sexual activity.  Continue with your regular eye exams as directed by your caregiver. What to expect:  It is normal to feel itching and mild discomfort for a few days after cataract surgery. Some fluid discharge is also common, and your eye may be sensitive to light and touch.  After 1 to 2 days, even moderate discomfort should disappear. In most cases, healing will take about 6 weeks.  If you received an intraocular lens (IOL), you may notice that colors are very bright or have a blue tinge. Also, if you have been in bright sunlight, everything may appear reddish for a few hours. If you see these color tinges, it is because your lens is clear and no longer cloudy. Within a few months after receiving an IOL, these extra colors should go away. When you have healed, you will probably need new glasses. SEEK MEDICAL CARE IF:   You have increased bruising around your  eye.  You have discomfort not helped by medicine. SEEK IMMEDIATE MEDICAL CARE IF:   You have a fever.  You have a worsening or sudden vision loss.  You have redness, swelling, or increasing pain in the eye.  You have a thick discharge from the eye that had surgery. MAKE SURE YOU:  Understand these instructions.  Will watch your condition.  Will get help right away if you are not doing well or get worse. Document Released: 10/27/2004 Document Revised: 07/02/2011 Document Reviewed: 12/01/2010 [facility] Patient  Information 2015 [place], [place]. This information is not intended to replace advice given to you by your health care provider. Make sure you discuss any questions you have with your health care provider.      Please contact your physician with any problems or Same Day Surgery at [phone], Monday through Friday 6 am to 4 pm, or [place] at [facility] number at [phone].

## 2014-11-08 NOTE — Anesthesia Postprocedure Evaluation (Signed)
  Anesthesia Post-op Note  Patient: Eddie Castaneda  Procedure(s) Performed: Procedure(s) with comments: CATARACT EXTRACTION PHACO AND INTRAOCULAR LENS PLACEMENT (IOC) (Left) - US:01:20 AP:21.8 CDE:31.09 PAC LOT: [id number]  Anesthesia type:General  Patient location: PACU  Post pain: Pain level controlled  Post assessment: Post-op Vital signs reviewed, Patient's Cardiovascular Status Stable, Respiratory Function Stable, Patent Airway and No signs of Nausea or vomiting  Post vital signs: Reviewed and stable  Last Vitals:  Filed Vitals:   11/08/14 1315  BP: 149/83  Pulse: 61  Temp:   Resp: 16    Level of consciousness: awake, alert  and patient cooperative  Complications: No apparent anesthesia complications

## 2014-11-08 NOTE — Op Note (Signed)
Date of Surgery: 11/08/2014 Date of Dictation: 11/08/2014 12:59 PM Pre-operative Diagnosis:  Nuclear Sclerotic Cataract left Eye Post-operative Diagnosis: same Procedure performed: Extra-capsular Cataract Extraction (ECCE) with placement of a posterior chamber intraocular lens (IOL) left Eye IOL:  Implant Name Type Inv. Item Serial No. Manufacturer Lot No. LRB No. Used  LENS IOL ACRYSERT 18.0 - [id number] Intraocular Lens LENS IOL ACRYSERT 18.0 [id number] ALCON   Left 1   Anesthesia: 2% Lidocaine and 4% Marcaine in a 50/50 mixture with 10 unites/ml of Hylenex given as a peribulbar Anesthesiologist: Anesthesiologist: Andria Frames, MD CRNA: Sinda Du, CRNA Complications: none Estimated Blood Loss: less than 1 ml  Description of procedure:  The patient was given anesthesia and sedation via intravenous access. The patient was then prepped and draped in the usual fashion. A 25-gauge needle was bent for initiating the capsulorhexis. A 5-0 silk suture was placed through the conjunctiva superior and inferiorly to serve as bridle sutures. Hemostasis was obtained at the superior limbus using an eraser cautery. A partial thickness groove was made at the anterior surgical limbus with a 64 Beaver blade and this was dissected anteriorly with an [employer]. The anterior chamber was entered at 10 o'clock with a 1.0 mm paracentesis knife and through the lamellar dissection with a 2.6 mm Alcon keratome. Epi-Shugarcaine 0.5 CC [9 cc BSS Plus (Alcon), 3 cc 4% preservative-free lidocaine (Hospira) and 4 cc 1:1000 preservative-free, bisulfite-free epinephrine] was injected via the paracentesis tract. DiscoVisc was injected to replace the aqueous and a continuous tear curvilinear capsulorhexis was performed using a bent 25-gauge needle.  Balance salt on a syringe was used to perform hydro-dissection and phacoemulsification was carried out using a divide and conquer technique. Procedure(s) with  comments: CATARACT EXTRACTION PHACO AND INTRAOCULAR LENS PLACEMENT ([facility]) (Left) - US:01:20 AP:21.8 CDE:31.09 PAC LOT: [id number]. Irrigation/aspiration was used to remove the residual cortex and the capsular bag was inflated with DiscoVisc. The intraocular lens was inserted into the capsular bag using a pre-loaded Acrysert Delivery System. Irrigation/aspiration was used to remove the residual DiscoVisc. The wound was inflated with balanced salt and checked for leaks. None were found. Miostat was injected via the paracentesis track and 0.1 ml of cefuroxime containing 1 mg of drug  was injected via the paracentesis track. The wound was checked for leaks again and none were found.   The bridal sutures were removed and two drops of Vigamox were placed on the eye. An eye shield was placed to protect the eye and the patient was discharged to the recovery area in good condition.   Queena Monrreal MD

## 2014-11-08 NOTE — Interval H&P Note (Signed)
History and Physical Interval Note:  11/08/2014 12:18 PM  Eddie Castaneda  has presented today for surgery, with the diagnosis of CATARACT  The various methods of treatment have been discussed with the patient and family. After consideration of risks, benefits and other options for treatment, the patient has consented to  Procedure(s) with comments: CATARACT EXTRACTION PHACO AND INTRAOCULAR LENS PLACEMENT ([place]) (Left) - [place]: AP: CDE: PAC LOT: [id number] as a surgical intervention .  The patient's history has been reviewed, patient examined, no change in status, stable for surgery.  I have reviewed the patient's chart and labs.  Questions were answered to the patient's satisfaction.     Deasiah Hagberg

## 2014-11-08 NOTE — Transfer of Care (Signed)
Immediate Anesthesia Transfer of Care Note  Patient: Eddie Castaneda  Procedure(s) Performed: Procedure(s) with comments: CATARACT EXTRACTION PHACO AND INTRAOCULAR LENS PLACEMENT ([facility]) (Left) - US:01:20 AP:21.8 CDE:31.09 PAC LOT: [id number]  Patient Location: PACU  Anesthesia Type:MAC  Level of Consciousness: awake  Airway & Oxygen Therapy: Patient Spontanous Breathing  Post-op Assessment: Report given to RN  Post vital signs: Reviewed  Last Vitals:  Filed Vitals:   11/08/14 1120  BP: 169/85  Pulse: 72  Temp: 36.9 C  Resp: 16    Complications: No apparent anesthesia complications

## 2014-11-08 NOTE — Anesthesia Preprocedure Evaluation (Addendum)
Anesthesia Evaluation  Patient identified by MRN, date of birth, ID band Patient awake    Reviewed: Allergy & Precautions, H&P , NPO status , Patient's Chart, lab work & pertinent test results, reviewed documented beta blocker date and time   Airway Mallampati: II  TM Distance: >3 FB Neck ROM: full    Dental  (+) Missing, Upper Dentures, Lower Dentures   Pulmonary former smoker,  breath sounds clear to auscultation  Pulmonary exam normal       Cardiovascular Exercise Tolerance: Good hypertension, + CAD and + Past MI Normal cardiovascular exam+ dysrhythmias Rhythm:regular Rate:Normal     Neuro/Psych negative neurological ROS  negative psych ROS   GI/Hepatic Neg liver ROS, hiatal hernia, GERD-  Controlled,  Endo/Other  negative endocrine ROS  Renal/GU negative Renal ROS  negative genitourinary   Musculoskeletal  (+) Arthritis -,   Abdominal   Peds  Hematology negative hematology ROS (+)   Anesthesia Other Findings Past Medical History:   History of hiatal hernia                                     GERD (gastroesophageal reflux disease)                       Arthritis                                                    Anemia                                                       Dysrhythmia                                                  Hypertension                                                 Coronary artery disease                                      Diverticulitis                                               Spinal stenosis                                              Reproductive/Obstetrics negative OB ROS  Anesthesia Physical Anesthesia Plan  ASA: III  Anesthesia Plan: General   Post-op Pain Management:    Induction:   Airway Management Planned:   Additional Equipment:   Intra-op Plan:   Post-operative Plan:   Informed Consent: I have  reviewed the patients History and Physical, chart, labs and discussed the procedure including the risks, benefits and alternatives for the proposed anesthesia with the patient or authorized representative who has indicated his/her understanding and acceptance.   Dental Advisory Given  Plan Discussed with: Anesthesiologist, CRNA and Surgeon  Anesthesia Plan Comments:         Anesthesia Quick Evaluation

## 2015-08-07 ENCOUNTER — Emergency Department: Payer: Medicare Other

## 2015-08-07 ENCOUNTER — Encounter: Payer: Self-pay | Admitting: Emergency Medicine

## 2015-08-07 ENCOUNTER — Emergency Department
Admission: EM | Admit: 2015-08-07 | Discharge: 2015-08-07 | Disposition: A | Discharge status: Home / Self Care | Payer: Medicare Other | Attending: Emergency Medicine | Admitting: Emergency Medicine

## 2015-08-07 DIAGNOSIS — Y929 Unspecified place or not applicable: Secondary | ICD-10-CM | POA: Insufficient documentation

## 2015-08-07 DIAGNOSIS — K219 Gastro-esophageal reflux disease without esophagitis: Secondary | ICD-10-CM | POA: Diagnosis not present

## 2015-08-07 DIAGNOSIS — Z7982 Long term (current) use of aspirin: Secondary | ICD-10-CM | POA: Insufficient documentation

## 2015-08-07 DIAGNOSIS — Y999 Unspecified external cause status: Secondary | ICD-10-CM | POA: Diagnosis not present

## 2015-08-07 DIAGNOSIS — W1809XA Striking against other object with subsequent fall, initial encounter: Secondary | ICD-10-CM | POA: Insufficient documentation

## 2015-08-07 DIAGNOSIS — I2510 Atherosclerotic heart disease of native coronary artery without angina pectoris: Secondary | ICD-10-CM | POA: Diagnosis not present

## 2015-08-07 DIAGNOSIS — S0990XA Unspecified injury of head, initial encounter: Secondary | ICD-10-CM | POA: Diagnosis not present

## 2015-08-07 DIAGNOSIS — Y939 Activity, unspecified: Secondary | ICD-10-CM | POA: Diagnosis not present

## 2015-08-07 DIAGNOSIS — I10 Essential (primary) hypertension: Secondary | ICD-10-CM | POA: Diagnosis not present

## 2015-08-07 DIAGNOSIS — S0191XA Laceration without foreign body of unspecified part of head, initial encounter: Secondary | ICD-10-CM | POA: Diagnosis present

## 2015-08-07 MED ORDER — LIDOCAINE-EPINEPHRINE (PF) 1 %-1:200000 IJ SOLN
INTRAMUSCULAR | Status: DC
Start: 2015-08-07 — End: 2015-08-07
  Filled 2015-08-07: qty 60

## 2015-08-07 MED ORDER — LIDOCAINE-EPINEPHRINE-TETRACAINE (LET) SOLUTION
NASAL | Status: AC
  Administered 2015-08-07: 3 mL
  Filled 2015-08-07: qty 3

## 2015-08-07 MED ORDER — LIDOCAINE-EPINEPHRINE-TETRACAINE (LET) SOLUTION
NASAL | Status: DC
Start: 2015-08-07 — End: 2015-08-07
  Filled 2015-08-07: qty 15

## 2015-08-07 MED ORDER — HYDROCODONE-ACETAMINOPHEN 5-325 MG PO TABS
1.0000 | ORAL_TABLET | Freq: Once | ORAL | Status: AC
  Administered 2015-08-07: 1 via ORAL
  Filled 2015-08-07: qty 1

## 2015-08-07 MED ORDER — HYDROMORPHONE HCL 1 MG/ML IJ SOLN
1.0000 mg | Freq: Once | INTRAMUSCULAR | Status: AC
  Administered 2015-08-07: 1 mg via INTRAVENOUS
  Filled 2015-08-07: qty 1

## 2015-08-07 MED ORDER — HYDROCODONE-ACETAMINOPHEN 5-325 MG PO TABS: 1.0000 | ORAL_TABLET | Freq: Four times a day (QID) | ORAL | Status: DC | PRN

## 2015-08-07 MED ORDER — ONDANSETRON HCL 4 MG/2ML IJ SOLN
4.0000 mg | Freq: Once | INTRAMUSCULAR | Status: AC
  Administered 2015-08-07: 4 mg via INTRAVENOUS
  Filled 2015-08-07: qty 2

## 2015-08-07 NOTE — Discharge Instructions (Signed)
Concussion, Adult A concussion, or closed-head injury, is a brain injury caused by a direct blow to the head or by a quick and sudden movement (jolt) of the head or neck. Concussions are usually not life-threatening. Even so, the effects of a concussion can be serious. If you have had a concussion before, you are more likely to experience concussion-like symptoms after a direct blow to the head.  CAUSES  Direct blow to the head, such as from running into another player during a soccer game, being hit in a fight, or hitting your head on a hard surface.  A jolt of the head or neck that causes the brain to move back and forth inside the skull, such as in a car crash. SIGNS AND SYMPTOMS The signs of a concussion can be hard to notice. Early on, they may be missed by you, family members, and health care providers. You may look fine but act or feel differently. Symptoms are usually temporary, but they may last for days, weeks, or even longer. Some symptoms may appear right away while others may not show up for hours or days. Every head injury is different. Symptoms include:  Mild to moderate headaches that will not go away.  A feeling of pressure inside your head.  Having more trouble than usual:  Learning or remembering things you have heard.  Answering questions.  Paying attention or concentrating.  Organizing daily tasks.  Making decisions and solving problems.  Slowness in thinking, acting or reacting, speaking, or reading.  Getting lost or being easily confused.  Feeling tired all the time or lacking energy (fatigued).  Feeling drowsy.  Sleep disturbances.  Sleeping more than usual.  Sleeping less than usual.  Trouble falling asleep.  Trouble sleeping (insomnia).  Loss of balance or feeling lightheaded or dizzy.  Nausea or vomiting.  Numbness or tingling.  Increased sensitivity to:  Sounds.  Lights.  Distractions.  Vision problems or eyes that tire  easily.  Diminished sense of taste or smell.  Ringing in the ears.  Mood changes such as feeling sad or anxious.  Becoming easily irritated or angry for little or no reason.  Lack of motivation.  Seeing or hearing things other people do not see or hear (hallucinations). DIAGNOSIS Your health care provider can usually diagnose a concussion based on a description of your injury and symptoms. He or she will ask whether you passed out (lost consciousness) and whether you are having trouble remembering events that happened right before and during your injury. Your evaluation might include:  A brain scan to look for signs of injury to the brain. Even if the test shows no injury, you may still have a concussion.  Blood tests to be sure other problems are not present. TREATMENT  Concussions are usually treated in an emergency department, in urgent care, or at a clinic. You may need to stay in the hospital overnight for further treatment.  Tell your health care provider if you are taking any medicines, including prescription medicines, over-the-counter medicines, and natural remedies. Some medicines, such as blood thinners (anticoagulants) and aspirin, may increase the chance of complications. Also tell your health care provider whether you have had alcohol or are taking illegal drugs. This information may affect treatment.  Your health care provider will send you home with important instructions to follow.  How fast you will recover from a concussion depends on many factors. These factors include how severe your concussion is, what part of your brain was injured,  your age, and how healthy you were before the concussion. °· Most people with mild injuries recover fully. Recovery can take time. In general, recovery is slower in older persons. Also, persons who have had a concussion in the past or have other medical problems may find that it takes longer to recover from their current injury. °HOME  CARE INSTRUCTIONS °General Instructions °· Carefully follow the directions your health care provider gave you. °· Only take over-the-counter or prescription medicines for pain, discomfort, or fever as directed by your health care provider. °· Take only those medicines that your health care provider has approved. °· Do not drink alcohol until your health care provider says you are well enough to do so. Alcohol and certain other drugs may slow your recovery and can put you at risk of further injury. °· If it is harder than usual to remember things, write them down. °· If you are easily distracted, try to do one thing at a time. For example, do not try to watch TV while fixing dinner. °· Talk with family members or close friends when making important decisions. °· Keep all follow-up appointments. Repeated evaluation of your symptoms is recommended for your recovery. °· Watch your symptoms and tell others to do the same. Complications sometimes occur after a concussion. Older adults with a brain injury may have a higher risk of serious complications, such as a blood clot on the brain. °· Tell your teachers, school nurse, school counselor, coach, athletic trainer, or work manager about your injury, symptoms, and restrictions. Tell them about what you can or cannot do. They should watch for: °¨ Increased problems with attention or concentration. °¨ Increased difficulty remembering or learning new information. °¨ Increased time needed to complete tasks or assignments. °¨ Increased irritability or decreased ability to cope with stress. °¨ Increased symptoms. °· Rest. Rest helps the brain to heal. Make sure you: °¨ Get plenty of sleep at night. Avoid staying up late at night. °¨ Keep the same bedtime hours on weekends and weekdays. °¨ Rest during the day. Take daytime naps or rest breaks when you feel tired. °· Limit activities that require a lot of thought or concentration. These include: °¨ Doing homework or job-related  work. °¨ Watching TV. °¨ Working on the computer. °· Avoid any situation where there is potential for another head injury (football, hockey, soccer, basketball, martial arts, downhill snow sports and horseback riding). Your condition will get worse every time you experience a concussion. You should avoid these activities until you are evaluated by the appropriate follow-up health care providers. °Returning To Your Regular Activities °You will need to return to your normal activities slowly, not all at once. You must give your body and brain enough time for recovery. °· Do not return to sports or other athletic activities until your health care provider tells you it is safe to do so. °· Ask your health care provider when you can drive, ride a bicycle, or operate heavy machinery. Your ability to react may be slower after a brain injury. Never do these activities if you are dizzy. °· Ask your health care provider about when you can return to work or school. °Preventing Another Concussion °It is very important to avoid another brain injury, especially before you have recovered. In rare cases, another injury can lead to permanent brain damage, brain swelling, or death. The risk of this is greatest during the first 7-10 days after a head injury. Avoid injuries by: °· Wearing a   seat belt when riding in a car.  Drinking alcohol only in moderation.  Wearing a helmet when biking, skiing, skateboarding, skating, or doing similar activities.  Avoiding activities that could lead to a second concussion, such as contact or recreational sports, until your health care provider says it is okay.  Taking safety measures in your home.  Remove clutter and tripping hazards from floors and stairways.  Use grab bars in bathrooms and handrails by stairs.  Place non-slip mats on floors and in bathtubs.  Improve lighting in dim areas. SEEK MEDICAL CARE IF:  You have increased problems paying attention or  concentrating.  You have increased difficulty remembering or learning new information.  You need more time to complete tasks or assignments than before.  You have increased irritability or decreased ability to cope with stress.  You have more symptoms than before. Seek medical care if you have any of the following symptoms for more than 2 weeks after your injury:  Lasting (chronic) headaches.  Dizziness or balance problems.  Nausea.  Vision problems.  Increased sensitivity to noise or light.  Depression or mood swings.  Anxiety or irritability.  Memory problems.  Difficulty concentrating or paying attention.  Sleep problems.  Feeling tired all the time. SEEK IMMEDIATE MEDICAL CARE IF:  You have severe or worsening headaches. These may be a sign of a blood clot in the brain.  You have weakness (even if only in one hand, leg, or part of the face).  You have numbness.  You have decreased coordination.  You vomit repeatedly.  You have increased sleepiness.  One pupil is larger than the other.  You have convulsions.  You have slurred speech.  You have increased confusion. This may be a sign of a blood clot in the brain.  You have increased restlessness, agitation, or irritability.  You are unable to recognize people or places.  You have neck pain.  It is difficult to wake you up.  You have unusual behavior changes.  You lose consciousness. MAKE SURE YOU:  Understand these instructions.  Will watch your condition.  Will get help right away if you are not doing well or get worse.   This information is not intended to replace advice given to you by your health care provider. Make sure you discuss any questions you have with your health care provider.   Document Released: 06/30/2003 Document Revised: 04/30/2014 Document Reviewed: 10/30/2012 Elsevier Interactive Patient Education 2016 [place], [place], or Adhesive Wound  Closure Health care providers use stitches (sutures), staples, and certain glue (skin adhesives) to hold skin together while it heals (wound closure). You may need this treatment after you have surgery or if you cut your skin accidentally. These methods help your skin to heal more quickly and make it less likely that you will have a scar. A wound may take several months to heal completely. The type of wound you have determines when your wound gets closed. In most cases, the wound is closed as soon as possible (primary skin closure). Sometimes, closure is delayed so the wound can be cleaned and allowed to heal naturally. This reduces the chance of infection. Delayed closure may be needed if your wound:  Is caused by a bite.  Happened more than 6 hours ago.  Involves loss of skin or the tissues under the skin.  Has dirt or debris in it that cannot be removed.  Is infected. WHAT ARE THE DIFFERENT KINDS OF WOUND CLOSURES? There are  many options for wound closure. The one that your health care provider uses depends on how deep and how large your wound is. Adhesive Glue To use this type of glue to close a wound, your health care provider holds the edges of the wound together and paints the glue on the surface of your skin. You may need more than one layer of glue. Then the wound may be covered with a light bandage (dressing). This type of skin closure may be used for small wounds that are not deep (superficial). Using glue for wound closure is less painful than other methods. It does not require a medicine that numbs the area (local anesthetic). This method also leaves nothing to be removed. Adhesive glue is often used for children and on facial wounds. Adhesive glue cannot be used for wounds that are deep, uneven, or bleeding. It is not used inside of a wound.  Adhesive Strips These strips are made of sticky (adhesive), porous paper. They are applied across your skin edges like a regular adhesive  bandage. You leave them on until they fall off. Adhesive strips may be used to close very superficial wounds. They may also be used along with sutures to improve the closure of your skin edges.  Sutures Sutures are the oldest method of wound closure. Sutures can be made from natural substances, such as silk, or from synthetic materials, such as nylon and steel. They can be made from a material that your body can break down as your wound heals (absorbable), or they can be made from a material that needs to be removed from your skin (nonabsorbable). They come in many different strengths and sizes. Your health care provider attaches the sutures to a steel needle on one end. Sutures can be passed through your skin, or through the tissues beneath your skin. Then they are tied and cut. Your skin edges may be closed in one continuous stitch or in separate stitches. Sutures are strong and can be used for all kinds of wounds. Absorbable sutures may be used to close tissues under the skin. The disadvantage of sutures is that they may cause skin reactions that lead to infection. Nonabsorbable sutures need to be removed. Staples When surgical staples are used to close a wound, the edges of your skin on both sides of the wound are brought close together. A staple is placed across the wound, and an instrument secures the edges together. Staples are often used to close surgical cuts (incisions). Staples are faster to use than sutures, and they cause less skin reaction. Staples need to be removed using a tool that bends the staples away from your skin. HOW DO I CARE FOR MY WOUND CLOSURE?  Take medicines only as directed by your health care provider.  If you were prescribed an antibiotic medicine for your wound, finish it all even if you start to feel better.  Use ointments or creams only as directed by your health care provider.  Wash your hands with soap and water before and after touching your wound.  Do not  soak your wound in water. Do not take baths, swim, or use a hot tub until your health care provider approves.  Ask your health care provider when you can start showering. Cover your wound if directed by your health care provider.  Do not take out your own sutures or staples.  Do not pick at your wound. Picking can cause an infection.  Keep all follow-up visits as directed by your health  care provider. This is important. HOW LONG WILL I HAVE MY WOUND CLOSURE?  Leave adhesive glue on your skin until the glue peels away.  Leave adhesive strips on your skin until the strips fall off.  Absorbable sutures will dissolve within several days.  Nonabsorbable sutures and staples must be removed. The location of the wound will determine how long they stay in. This can range from several days to a couple of weeks. WHEN SHOULD I SEEK HELP FOR MY WOUND CLOSURE? Contact your health care provider if:  You have a fever.  You have chills.  You have drainage, redness, swelling, or pain at your wound.  There is a bad smell coming from your wound.  The skin edges of your wound start to separate after your sutures have been removed.  Your wound becomes thick, raised, and darker in color after your sutures come out (scarring).   This information is not intended to replace advice given to you by your health care provider. Make sure you discuss any questions you have with your health care provider.   Document Released: 01/02/2001 Document Revised: 04/30/2014 Document Reviewed: 09/16/2013 Elsevier Interactive Patient Education 2016 [employer].  Please see if your bandages alone for the next 24 hours. Drink plenty of fluids and apply ice to the area of swelling. After 24 hours she can remove her dressings on your head laceration believe the dressings on yourand the outside surface of your right eye. Clean the wound around the stitches with hydrogen peroxide and apply Neosporin with vitamin ointment.  Suture removal should that occur until 7-10 days. The Steri-Strips will grow up and fall off on their own.

## 2015-08-07 NOTE — ED Provider Notes (Signed)
Time Seen: Approximately 1620 I have reviewed the triage notes  Chief Complaint: Fall and Laceration   History of Present Illness: Eddie Castaneda is a 82 y.o. male who presents after a fall today at a family function. He apparently had just got SOME CONCRETE STEPS AND FELL FORWARD STRIKING HIS HEAD AND NASAL AREA. HE STATES HE HAD A PREVIOUS WOUND ON HIS RIGHT LEG WHICH WAS A SKIN BIOPSY VERSUS INFECTED. HE STATES THAT THE WOUND IS ALSO PULLED APART. HE DENIES ANY LOSS OF CONSCIOUSNESS. HE DENIES ANY VISUAL DISTURBANCES. HE PRESENTS WITH A LARGE HEAD LACERATION AND RIGHT-SIDED FACIAL SWELLING. HE DENIES ANY NECK PAIN OR WEAKNESS.   Past Medical History  Diagnosis Date  . History of hiatal hernia   . GERD (gastroesophageal reflux disease)   . Arthritis   . Anemia   . Dysrhythmia   . Hypertension   . Coronary artery disease   . Diverticulitis   . Spinal stenosis     There are no active problems to display for this patient.   Past Surgical History  Procedure Laterality Date  . Back surgery    . Eye surgery    . Cholecystectomy    . Joint replacement Right     Knee  . Rotator cuff repair Right   . Cataract extraction w/phaco Left 11/08/2014    Procedure: CATARACT EXTRACTION PHACO AND INTRAOCULAR LENS PLACEMENT (IOC);  Surgeon: Estill Cotta, MD;  Location: [facility] ORS;  Service: Ophthalmology;  Laterality: Left;  US:01:20 AP:21.8 CDE:31.09 PAC LOT: [id number]    Past Surgical History  Procedure Laterality Date  . Back surgery    . Eye surgery    . Cholecystectomy    . Joint replacement Right     Knee  . Rotator cuff repair Right   . Cataract extraction w/phaco Left 11/08/2014    Procedure: CATARACT EXTRACTION PHACO AND INTRAOCULAR LENS PLACEMENT (IOC);  Surgeon: Estill Cotta, MD;  Location: [facility] ORS;  Service: Ophthalmology;  Laterality: Left;  US:01:20 AP:21.8 CDE:31.09 PAC LOT: [id number]    Current Outpatient Rx  Name  Route  Sig  Dispense  Refill  .  acetaminophen (TYLENOL) 650 MG CR tablet   Oral   Take 650 mg by mouth every 8 (eight) hours as needed for pain.         Marland Kitchen aspirin 81 MG tablet   Oral   Take 81 mg by mouth daily.         Marland Kitchen atorvastatin (LIPITOR) 10 MG tablet   Oral   Take 10 mg by mouth daily.         Marland Kitchen HYDROcodone-acetaminophen (NORCO) 5-325 MG tablet   Oral   Take 1 tablet by mouth every 6 (six) hours as needed for moderate pain.   20 tablet   0   . metoprolol tartrate (LOPRESSOR) 25 MG tablet   Oral   Take 25 mg by mouth daily.         Marland Kitchen omeprazole (PRILOSEC) 20 MG capsule   Oral   Take 20 mg by mouth daily.         . quinapril (ACCUPRIL) 40 MG tablet   Oral   Take 40 mg by mouth 1 day or 1 dose.         . traMADol (ULTRAM-ER) 100 MG 24 hr tablet   Oral   Take 100 mg by mouth 2 (two) times daily. With lunch and dinner         . traMADol (ULTRAM-ER)  300 MG 24 hr tablet   Oral   Take 300 mg by mouth daily. AM dose           Allergies:  Morphine and related  Family History: History reviewed. No pertinent family history.  Social History: Social History  Substance Use Topics  . Smoking status: Former Research scientist (life sciences)  . Smokeless tobacco: None  . Alcohol Use: None     Review of Systems:   10 point review of systems was performed and was otherwise negative:  Constitutional: No fever Eyes: No visual disturbances ENT: No sore throat, ear pain Cardiac: No chest pain Respiratory: No shortness of breath, wheezing, or stridor Abdomen: No abdominal pain, no vomiting, No diarrhea Endocrine: No weight loss, No night sweats Extremities: No peripheral edema, cyanosis Skin: No rashes, easy bruising Neurologic: No focal weakness, trouble with speech or swollowing Urologic: No dysuria, Hematuria, or urinary frequency   Physical Exam:  ED Triage Vitals  Enc Vitals Group     BP 08/07/15 1543 177/84 mmHg     Pulse Rate 08/07/15 1543 80     Resp 08/07/15 1543 17     Temp --      Temp  src --      SpO2 08/07/15 1543 100 %     Weight 08/07/15 1543 178 lb (80.74 kg)     Height 08/07/15 1543 5\' 11"  (1.803 m)     Head Cir --      Peak Flow --      Pain Score 08/07/15 1544 4     Pain Loc --      Pain Edu? --      Excl. in [place]? --     General: Awake , Alert , and Oriented times 3; GCS 15 Head: 8 cm linear laceration above the right eye. There is a 1 cm laceration located toward the outside area of the corner of his eye. It does not involve the canthus. The patient's laceration on his forehead region is an abrasion/laceration skin tear. He also has an avulsion injury to centimeters on the bridge of his nose. Does not appear to be an open fracture. The skin and some subcutaneous tissue is missing at this time with small amount of active bleeding Eyes: Large amount of ecchymosis surrounding the right eye with no entrapment with full range of motion of his extraocular eye movements. His pupils are equal round reactive to light I sent contact is clear or deviation of the septum.  Nose/Throat: No nasal drainage, patent upper airway without erythema or exudate. No bleeding from the nasal canal.  Negative for hemotympanum  Neck: Supple, Full range of motion, No anterior adenopathy or palpable thyroid masses Lungs: Clear to ascultation without wheezes , rhonchi, or rales Heart: Regular rate, regular rhythm without murmurs , gallops , or rubs Abdomen: Soft, non tender without rebound, guarding , or rigidity; bowel sounds positive and symmetric in all 4 quadrants. No organomegaly .        Extremities patient has a 2 cm surgical wound located vertical direction that shows the skin has split apart with no active bleeding at this time. Neurologic: normal ambulation, Motor symmetric without deficits, sensory intact Skin: warm, dry, no rashes   Radiology:   EXAM: CT HEAD WITHOUT CONTRAST  CT MAXILLOFACIAL WITHOUT CONTRAST  TECHNIQUE: Multidetector CT imaging of the head and  maxillofacial structures were performed using the standard protocol without intravenous contrast. Multiplanar CT image reconstructions of the maxillofacial structures were also generated.  COMPARISON: None.  FINDINGS: CT HEAD FINDINGS  No evidence for acute infarction, hemorrhage, mass lesion, hydrocephalus, or extra-axial fluid. Generalized atrophy. Hypoattenuation of white matter, likely chronic microvascular ischemic change. Tiny chronic lacune RIGHT caudate.  Large RIGHT frontal scalp hematoma. Vascular calcification. No underlying skull fracture.  CT MAXILLOFACIAL FINDINGS  No facial fracture is evident. There is no sinus air-fluid level. Dentures.TMJs are located. Maxilla and mandible appear intact.  No orbital hematoma. No blowout injury. BILATERAL cataract extraction. Mild preseptal periorbital soft tissue swelling related to the hematoma.  Chronic nasal septal deviation RIGHT to LEFT of 4 mm with spurring. Mild BILATERAL infundibular narrowing.  Visualized upper cervical region shows no acute findings. Mild pannus. Mild OPLL. No nasopharyngeal fluid  IMPRESSION: Atrophy and small vessel disease. No acute intracranial findings.  Large RIGHT frontal scalp hematoma without skull fracture or contrecoup injury.  No facial fracture is evident. Chronic RIGHT-to-LEFT nasal septal deviation.  RIGHT preseptal periorbital soft tissue swelling without orbital hematoma or blowout injury.   Electronically Signed    I personally reviewed the radiologic studies   Procedures:  The surgical lack that had the skin infection that spread apart on his right leg was Steri-Stripped and I felt closure at this time would be a risk for infection. No current active bleeding 1 cm laceration outside the right corner of his eye had Steri-Strips applied it appears to be skin only with no active bleeding  Laceration repair of above the 8 cm facial wound LACERATION  REPAIR Performed by: Daymon Larsen Authorized by: Daymon Larsen Consent: Verbal consent obtained. Risks and benefits: risks, benefits and alternatives were discussed Consent given by: patient Patient identity confirmed: provided demographic data Prepped and Draped in normal sterile fashion Wound explored  Laceration Location: Right forehead  Laceration Length: 8 cm  No Foreign Bodies seen or palpated  Anesthesia: local infiltration  Local anesthetic: lidocaine 1% with epinephrine  Anesthetic total: 8 ml  Irrigation method: syringe Amount of cleaning: standard  Skin closure: 2. Closure ; 3 deep 5-0 Vicryl sutures  8 interrupted 6-0 Ethilon sutures   Number of sutures: Total 11 stitches   Technique: Layered technique   Patient tolerance: Patient tolerated the procedure well with no immediate complications.    ED Course:  Patient's stay here was uneventful and applied Gelfoam to the areas to the bridge of his nose and also over the surface of the abrasions noted otherwise is laceration. They have been advised to leave these dressings in place for at least 24 hours. Wound instructions was given to the patient along with the family at the bedside. I prescribed Norco for pain and he was also given head injury instructions.    Assessment: Complicated facial laceration Acute closed head injury   Final Clinical Impression:  Final diagnoses:  Traumatic head injury with multiple lacerations, initial encounter     Plan:  Outpatient management            Daymon Larsen, MD 08/07/15 1940

## 2015-08-07 NOTE — ED Notes (Signed)
Pt arrived to ED by EMS after falling at a family function. Per EMS pt has 3 inch lac to forehead, a small lac to right eyelid, an abrasion to the bridge of his nose, an abrasion on his rt wrist and fingers and a re-injury to his left ankle where he recently had skin cancer removed and complications from the stitches. Per EMS no LOC, pupils E&R and no vision changes.

## 2015-08-07 NOTE — ED Notes (Signed)
Patient given wound care supplied per MD orders.

## 2016-12-20 ENCOUNTER — Other Ambulatory Visit: Payer: Self-pay | Admitting: Gastroenterology

## 2016-12-20 DIAGNOSIS — R1310 Dysphagia, unspecified: Secondary | ICD-10-CM

## 2016-12-25 ENCOUNTER — Ambulatory Visit
Admission: RE | Admit: 2016-12-25 | Discharge: 2016-12-25 | Disposition: A | Discharge status: Home / Self Care | Payer: Medicare Other | Source: Ambulatory Visit | Attending: Gastroenterology | Admitting: Gastroenterology

## 2016-12-25 DIAGNOSIS — K449 Diaphragmatic hernia without obstruction or gangrene: Secondary | ICD-10-CM | POA: Insufficient documentation

## 2016-12-25 DIAGNOSIS — R1310 Dysphagia, unspecified: Secondary | ICD-10-CM | POA: Diagnosis not present

## 2017-05-22 ENCOUNTER — Encounter: Payer: Self-pay | Admitting: *Deleted

## 2017-05-23 ENCOUNTER — Ambulatory Visit
Admission: RE | Admit: 2017-05-23 | Discharge: 2017-05-23 | Disposition: A | Discharge status: Home / Self Care | Payer: Medicare Other | Source: Ambulatory Visit | Attending: Gastroenterology | Admitting: Gastroenterology

## 2017-05-23 ENCOUNTER — Ambulatory Visit: Payer: Medicare Other | Admitting: Anesthesiology

## 2017-05-23 ENCOUNTER — Encounter: Payer: Self-pay | Admitting: Anesthesiology

## 2017-05-23 ENCOUNTER — Encounter
Admission: RE | Disposition: A | Discharge status: Home / Self Care | Payer: Self-pay | Source: Ambulatory Visit | Attending: Gastroenterology

## 2017-05-23 DIAGNOSIS — R1013 Epigastric pain: Secondary | ICD-10-CM | POA: Diagnosis present

## 2017-05-23 DIAGNOSIS — Z79899 Other long term (current) drug therapy: Secondary | ICD-10-CM | POA: Insufficient documentation

## 2017-05-23 DIAGNOSIS — Z7982 Long term (current) use of aspirin: Secondary | ICD-10-CM | POA: Insufficient documentation

## 2017-05-23 DIAGNOSIS — R1310 Dysphagia, unspecified: Secondary | ICD-10-CM | POA: Insufficient documentation

## 2017-05-23 DIAGNOSIS — C155 Malignant neoplasm of lower third of esophagus: Secondary | ICD-10-CM | POA: Insufficient documentation

## 2017-05-23 DIAGNOSIS — Z8719 Personal history of other diseases of the digestive system: Secondary | ICD-10-CM | POA: Diagnosis not present

## 2017-05-23 DIAGNOSIS — Z87891 Personal history of nicotine dependence: Secondary | ICD-10-CM | POA: Insufficient documentation

## 2017-05-23 DIAGNOSIS — K219 Gastro-esophageal reflux disease without esophagitis: Secondary | ICD-10-CM | POA: Diagnosis not present

## 2017-05-23 DIAGNOSIS — M1990 Unspecified osteoarthritis, unspecified site: Secondary | ICD-10-CM | POA: Insufficient documentation

## 2017-05-23 DIAGNOSIS — Z885 Allergy status to narcotic agent status: Secondary | ICD-10-CM | POA: Diagnosis not present

## 2017-05-23 DIAGNOSIS — K449 Diaphragmatic hernia without obstruction or gangrene: Secondary | ICD-10-CM | POA: Insufficient documentation

## 2017-05-23 DIAGNOSIS — E039 Hypothyroidism, unspecified: Secondary | ICD-10-CM | POA: Diagnosis not present

## 2017-05-23 DIAGNOSIS — I2510 Atherosclerotic heart disease of native coronary artery without angina pectoris: Secondary | ICD-10-CM | POA: Diagnosis not present

## 2017-05-23 DIAGNOSIS — I10 Essential (primary) hypertension: Secondary | ICD-10-CM | POA: Insufficient documentation

## 2017-05-23 DIAGNOSIS — E785 Hyperlipidemia, unspecified: Secondary | ICD-10-CM | POA: Diagnosis not present

## 2017-05-23 DIAGNOSIS — K317 Polyp of stomach and duodenum: Secondary | ICD-10-CM | POA: Diagnosis not present

## 2017-05-23 HISTORY — PX: ESOPHAGOGASTRODUODENOSCOPY (EGD) WITH PROPOFOL: SHX5813

## 2017-05-23 HISTORY — DX: Hypothyroidism, unspecified: E03.9

## 2017-05-23 HISTORY — DX: Hyperlipidemia, unspecified: E78.5

## 2017-05-23 HISTORY — DX: Hyperglycemia, unspecified: R73.9

## 2017-05-23 SURGERY — ESOPHAGOGASTRODUODENOSCOPY (EGD) WITH PROPOFOL
Anesthesia: General

## 2017-05-23 MED ORDER — METOPROLOL TARTRATE 5 MG/5ML IV SOLN
INTRAVENOUS | Status: AC
Start: 2017-05-23 — End: ?
  Filled 2017-05-23: qty 5

## 2017-05-23 MED ORDER — SODIUM CHLORIDE 0.9 % IV SOLN: INTRAVENOUS | Status: DC

## 2017-05-23 MED ORDER — LIDOCAINE HCL (PF) 2 % IJ SOLN
INTRAMUSCULAR | Status: AC
Start: 2017-05-23 — End: ?
  Filled 2017-05-23: qty 10

## 2017-05-23 MED ORDER — PHENYLEPHRINE HCL 10 MG/ML IJ SOLN
INTRAMUSCULAR | Status: AC
  Filled 2017-05-23: qty 1

## 2017-05-23 MED ORDER — FENTANYL CITRATE (PF) 100 MCG/2ML IJ SOLN
INTRAMUSCULAR | Status: AC
  Filled 2017-05-23: qty 2

## 2017-05-23 MED ORDER — SODIUM CHLORIDE 0.9 % IV SOLN
INTRAVENOUS | Status: DC
  Administered 2017-05-23: 09:00:00 via INTRAVENOUS

## 2017-05-23 MED ORDER — FENTANYL CITRATE (PF) 100 MCG/2ML IJ SOLN
INTRAMUSCULAR | Status: DC | PRN
  Administered 2017-05-23: 50 ug via INTRAVENOUS

## 2017-05-23 MED ORDER — PHENYLEPHRINE HCL 10 MG/ML IJ SOLN
INTRAMUSCULAR | Status: DC | PRN
  Administered 2017-05-23 (×3): 50 ug via INTRAVENOUS

## 2017-05-23 MED ORDER — PROPOFOL 500 MG/50ML IV EMUL
INTRAVENOUS | Status: AC
  Filled 2017-05-23: qty 50

## 2017-05-23 MED ORDER — PROPOFOL 500 MG/50ML IV EMUL
INTRAVENOUS | Status: DC | PRN
  Administered 2017-05-23: 100 ug/kg/min via INTRAVENOUS

## 2017-05-23 MED ORDER — METOPROLOL TARTRATE 5 MG/5ML IV SOLN
INTRAVENOUS | Status: DC | PRN
  Administered 2017-05-23: 1 mg via INTRAVENOUS

## 2017-05-23 NOTE — Anesthesia Procedure Notes (Signed)
Performed by: Cook-Martin, Liandro Thelin Pre-anesthesia Checklist: Patient identified, Emergency Drugs available, Suction available, Patient being monitored and Timeout performed Patient Re-evaluated:Patient Re-evaluated prior to induction Oxygen Delivery Method: Nasal cannula Preoxygenation: Pre-oxygenation with 100% oxygen Induction Type: IV induction Airway Equipment and Method: Bite block Placement Confirmation: positive ETCO2 and CO2 detector       

## 2017-05-23 NOTE — Op Note (Addendum)
[facility] Gastroenterology Patient Name: Glenda Kunst Procedure Date: 05/23/2017 9:33 AM MRN: [id number] Account #: [id number] Date of Birth: February 15, 1934 Admit Type: Outpatient Age: 84 Room: [facility] ENDO ROOM 1 Gender: Male Note Status: Finalized Procedure:            Upper GI endoscopy Indications:          Epigastric abdominal pain, Dysphagia, Odynophagia Providers:            Lollie Sails, MD Referring MD:         Ramonita Lab, MD (Referring MD) Medicines:            Monitored Anesthesia Care Complications:        No immediate complications. Procedure:            Pre-Anesthesia Assessment:                       - ASA Grade Assessment: II - A patient with mild                        systemic disease.                       After obtaining informed consent, the endoscope was                        passed under direct vision. Throughout the procedure,                        the patient's blood pressure, pulse, and oxygen                        saturations were monitored continuously. The Endoscope                        was introduced through the mouth, and advanced to the                        third part of duodenum. The upper GI endoscopy was                        accomplished without difficulty. The patient tolerated                        the procedure well. Findings:      A medium-sized, fungating mass with no bleeding and no stigmata, though       friable of recent bleeding was found in the distal esophagus extending       from about 35-37 cm from the incisors. The mass was non-obstructing,       partially obstructing, not circumferential. Biopsies were taken with a       cold forceps for histology.      A small to medium hiatal hernia was present.      A single 4 mm sessile polyp with no bleeding and no stigmata of recent       bleeding was found on the anterior wall of the gastric body. Biopsies       were taken with a cold forceps for histology.      The cardia and gastric fundus were normal on retroflexion.      The exam of the stomach was otherwise normal.  The examined duodenum was normal. 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 Biopsied.                       - Small to medium hiatal hernia.                       - A single gastric polyp. Biopsied.                       - Normal examined duodenum. Recommendation:       - Discharge patient to home.                       - Full liquid diet today, then advance as tolerated to                        advance diet as tolerated.                       - Await pathology results.                       - Continue present medications. Procedure Code(s):    --- Professional ---                       [phone], Esophagogastroduodenoscopy, flexible, transoral;                        with biopsy, single or multiple Diagnosis Code(s):    --- Professional ---                       D49.0, Neoplasm of unspecified behavior of digestive                        system                       K44.9, Diaphragmatic hernia without obstruction or                        gangrene                       K31.7, Polyp of stomach and duodenum                       R10.13, Epigastric pain                       R13.10, Dysphagia, unspecified CPT copyright 2016 American Medical Association. All rights reserved. The codes documented in this report are preliminary and upon coder review may  be revised to meet current compliance requirements. Lollie Sails, MD 05/23/2017 10:07:51 AM This report has been signed electronically. Number of Addenda: 0 Note Initiated On: 05/23/2017 9:33 AM      [facility]

## 2017-05-23 NOTE — Anesthesia Preprocedure Evaluation (Signed)
Anesthesia Evaluation  Patient identified by MRN, date of birth, ID band Patient awake    Reviewed: Allergy & Precautions, H&P , NPO status , Patient's Chart, lab work & pertinent test results, reviewed documented beta blocker date and time   History of Anesthesia Complications Negative for: history of anesthetic complications  Airway Mallampati: I  TM Distance: >3 FB Neck ROM: full    Dental  (+) Edentulous Upper, Edentulous Lower   Pulmonary neg shortness of breath, neg sleep apnea, neg COPD, neg recent URI, former smoker,           Cardiovascular Exercise Tolerance: Good hypertension, (-) angina+ CAD  (-) Past MI, (-) Cardiac Stents and (-) CABG + dysrhythmias + Valvular Problems/Murmurs      Neuro/Psych negative neurological ROS  negative psych ROS   GI/Hepatic Neg liver ROS, hiatal hernia, GERD  ,  Endo/Other  neg diabetesHypothyroidism   Renal/GU negative Renal ROS  negative genitourinary   Musculoskeletal   Abdominal   Peds  Hematology  (+) anemia ,   Anesthesia Other Findings Past Medical History: No date: Anemia No date: Arthritis No date: Coronary artery disease No date: Diverticulitis No date: Dysrhythmia No date: GERD (gastroesophageal reflux disease) No date: History of hiatal hernia No date: Hyperglycemia No date: Hyperlipidemia No date: Hypertension No date: Hypothyroidism No date: Spinal stenosis   Reproductive/Obstetrics negative OB ROS                             Anesthesia Physical Anesthesia Plan  ASA: II  Anesthesia Plan: General   Post-op Pain Management:    Induction: Intravenous  PONV Risk Score and Plan: 2 and Propofol infusion  Airway Management Planned: Nasal Cannula  Additional Equipment:   Intra-op Plan:   Post-operative Plan:   Informed Consent: I have reviewed the patients History and Physical, chart, labs and discussed the procedure  including the risks, benefits and alternatives for the proposed anesthesia with the patient or authorized representative who has indicated his/her understanding and acceptance.   Dental Advisory Given  Plan Discussed with: Anesthesiologist, CRNA and Surgeon  Anesthesia Plan Comments:         Anesthesia Quick Evaluation

## 2017-05-23 NOTE — Anesthesia Post-op Follow-up Note (Signed)
Anesthesia QCDR form completed.        

## 2017-05-23 NOTE — Anesthesia Postprocedure Evaluation (Signed)
Anesthesia Post Note  Patient: Eddie Castaneda  Procedure(s) Performed: ESOPHAGOGASTRODUODENOSCOPY (EGD) WITH PROPOFOL (N/A )  Patient location during evaluation: Endoscopy Anesthesia Type: General Level of consciousness: awake and alert Pain management: pain level controlled Vital Signs Assessment: post-procedure vital signs reviewed and stable Respiratory status: spontaneous breathing, nonlabored ventilation, respiratory function stable and patient connected to nasal cannula oxygen Cardiovascular status: blood pressure returned to baseline and stable Postop Assessment: no apparent nausea or vomiting Anesthetic complications: no     Last Vitals:  Vitals:   05/23/17 1000 05/23/17 1010  BP: 107/90 107/90  Pulse: 99 (!) 102  Resp: (!) 21 20  Temp:    SpO2: 100% 100%    Last Pain:  Vitals:   05/23/17 0950  TempSrc: Tympanic                 Martha Clan

## 2017-05-23 NOTE — Transfer of Care (Signed)
Immediate Anesthesia Transfer of Care Note  Patient: Eddie Castaneda  Procedure(s) Performed: ESOPHAGOGASTRODUODENOSCOPY (EGD) WITH PROPOFOL (N/A )  Patient Location: PACU  Anesthesia Type:General  Level of Consciousness: awake, alert , oriented and sedated  Airway & Oxygen Therapy: Patient Spontanous Breathing and Patient connected to nasal cannula oxygen  Post-op Assessment: Report given to RN and Post -op Vital signs reviewed and stable  Post vital signs: Reviewed and stable  Last Vitals:  Vitals:   05/23/17 0849  BP: (!) 149/88  Pulse: (!) 119  Resp: 18  Temp: (!) 36.3 C  SpO2: 100%    Last Pain:  Vitals:   05/23/17 0849  TempSrc: Tympanic         Complications: No apparent anesthesia complications

## 2017-05-23 NOTE — H&P (Signed)
Outpatient short stay form Pre-procedure 05/23/2017 9:26 AM Lollie Sails MD  Primary Physician: Dr. Ramonita Lab  Reason for visit: EGD  History of present illness: Patient is a 84 year old male presenting today as above.  For about the past 4 months or so he has had difficulties with odynophagia.  There is no dysphagia he has no difficulty swallowing foods however it seems that when foods at the bottom of the esophagus it hurts for just an instantaneous time and then it passes.  This is intermittent in nature.  He does not regurgitate foods.  He has had a barium swallow which indicates no stenosis or stricture however he does have a small to moderate sized hiatal hernia that is sliding in nature.  Takes no aspirin or blood thinning agent.    Current Facility-Administered Medications:  .  0.9 %  sodium chloride infusion, , Intravenous, Continuous, Lollie Sails, MD, Last Rate: 20 mL/hr at 05/23/17 0918 .  0.9 %  sodium chloride infusion, , Intravenous, Continuous, Lollie Sails, MD  Medications Prior to Admission  Medication Sig Dispense Refill Last Dose  . acetaminophen (TYLENOL) 650 MG CR tablet Take 650 mg by mouth every 8 (eight) hours as needed for pain.   05/22/2017 at Unknown time  . ascorbic acid (VITAMIN C) 1000 MG tablet Take 1,000 mg by mouth daily.   05/18/2017  . atorvastatin (LIPITOR) 10 MG tablet Take 10 mg by mouth daily.   05/22/2017 at Unknown time  . docusate sodium (COLACE) 100 MG capsule Take 100 mg by mouth daily.   05/22/2017 at Unknown time  . levothyroxine (SYNTHROID, LEVOTHROID) 100 MCG tablet Take 25 mcg by mouth daily before breakfast.   05/22/2017 at Unknown time  . metoprolol tartrate (LOPRESSOR) 25 MG tablet Take 25 mg by mouth daily.   05/22/2017 at Unknown time  . Multiple Vitamin (MULTIVITAMIN) tablet Take 1 tablet by mouth daily.   05/18/2017  . Omega-3 1000 MG CAPS Take 340-1,000 capsules by mouth 2 (two) times daily.   05/18/2017  . omeprazole  (PRILOSEC) 20 MG capsule Take 20 mg by mouth daily.   05/22/2017 at Unknown time  . predniSONE (DELTASONE) 5 MG tablet Take 5 mg by mouth daily with breakfast.   05/22/2017 at Unknown time  . Probiotic Product (PROBIOTIC COMPLEX ACIDOPHILUS PO) Take by mouth.   05/18/2017  . quinapril (ACCUPRIL) 40 MG tablet Take 40 mg by mouth 1 day or 1 dose.   05/23/2017 at 5:30 am  . tamsulosin (FLOMAX) 0.4 MG CAPS capsule Take 0.4 mg by mouth daily.   05/22/2017 at Unknown time  . vitamin B-12 (CYANOCOBALAMIN) 1000 MCG tablet Take 1,000 mcg by mouth daily.   05/18/2017  . aspirin 81 MG tablet Take 81 mg by mouth daily.   05/18/2017  . BACILLUS COAGULANS-INULIN PO Take by mouth.   Not Taking at Unknown time  . HYDROcodone-acetaminophen (NORCO) 5-325 MG tablet Take 1 tablet by mouth every 6 (six) hours as needed for moderate pain. (Patient not taking: Reported on 05/23/2017) 20 tablet 0 Not Taking at Unknown time  . traMADol (ULTRAM-ER) 100 MG 24 hr tablet Take 50 mg by mouth 2 (two) times daily. With lunch and dinner    11/07/2014 at Unknown time  . traMADol (ULTRAM-ER) 300 MG 24 hr tablet Take 300 mg by mouth daily. AM dose   05/18/2017     Allergies  Allergen Reactions  . Morphine And Related Nausea Only     Past Medical  History:  Diagnosis Date  . Anemia   . Arthritis   . Coronary artery disease   . Diverticulitis   . Dysrhythmia   . GERD (gastroesophageal reflux disease)   . History of hiatal hernia   . Hyperglycemia   . Hyperlipidemia   . Hypertension   . Hypothyroidism   . Spinal stenosis     Review of systems:      Physical Exam    Heart and lungs: Regular rate and rhythm without rub or gallop, lungs are bilaterally clear.    HEENT: Normocephalic atraumatic eyes are anicteric    Other:    Pertinant exam for procedure: Soft nontender nondistended bowel sounds positive normoactive    Planned proceedures: EGD and indicated procedures. I have discussed the risks benefits and  complications of procedures to include not limited to bleeding, infection, perforation and the risk of sedation and the patient wishes to proceed.    Lollie Sails, MD Gastroenterology 05/23/2017  9:26 AM

## 2017-05-24 ENCOUNTER — Other Ambulatory Visit: Payer: Self-pay | Admitting: Pathology

## 2017-05-24 ENCOUNTER — Other Ambulatory Visit: Payer: Self-pay | Admitting: Gastroenterology

## 2017-05-24 DIAGNOSIS — C159 Malignant neoplasm of esophagus, unspecified: Secondary | ICD-10-CM

## 2017-05-24 HISTORY — DX: Malignant neoplasm of esophagus, unspecified: C15.9

## 2017-05-27 ENCOUNTER — Telehealth: Payer: Self-pay

## 2017-05-27 NOTE — Telephone Encounter (Signed)
Received call from [facility] with new referral, esophageal cancer. PET requested and consult arranged with Dr. Janese Banks following. [facility] is notifying of appointment and sending notes. Oncology Nurse Navigator Documentation  Navigator Location: [facility] (05/27/17 0800)   )Navigator Encounter Type: Telephone (05/27/17 0800) Telephone: Incoming Call (05/27/17 0800)                                                  Time Spent with Patient: 15 (05/27/17 0800)

## 2017-05-28 ENCOUNTER — Ambulatory Visit
Admission: RE | Admit: 2017-05-28 | Discharge: 2017-05-28 | Disposition: A | Discharge status: Home / Self Care | Payer: Medicare Other | Source: Ambulatory Visit | Attending: Gastroenterology | Admitting: Gastroenterology

## 2017-05-28 DIAGNOSIS — C159 Malignant neoplasm of esophagus, unspecified: Secondary | ICD-10-CM | POA: Diagnosis not present

## 2017-05-28 DIAGNOSIS — I2510 Atherosclerotic heart disease of native coronary artery without angina pectoris: Secondary | ICD-10-CM | POA: Insufficient documentation

## 2017-05-28 DIAGNOSIS — I700 Atherosclerosis of aorta: Secondary | ICD-10-CM | POA: Insufficient documentation

## 2017-05-28 LAB — GLUCOSE, CAPILLARY: Glucose-Capillary: 87 mg/dL (ref 65–99)

## 2017-05-28 MED ORDER — FLUDEOXYGLUCOSE F - 18 (FDG) INJECTION
12.0000 | Freq: Once | INTRAVENOUS | Status: AC | PRN
  Administered 2017-05-28: 13.77 via INTRAVENOUS

## 2017-05-29 DIAGNOSIS — Z7982 Long term (current) use of aspirin: Secondary | ICD-10-CM | POA: Insufficient documentation

## 2017-05-29 DIAGNOSIS — K449 Diaphragmatic hernia without obstruction or gangrene: Secondary | ICD-10-CM | POA: Insufficient documentation

## 2017-05-29 DIAGNOSIS — Z87891 Personal history of nicotine dependence: Secondary | ICD-10-CM | POA: Insufficient documentation

## 2017-05-29 DIAGNOSIS — Z800 Family history of malignant neoplasm of digestive organs: Secondary | ICD-10-CM | POA: Insufficient documentation

## 2017-05-29 DIAGNOSIS — Z8719 Personal history of other diseases of the digestive system: Secondary | ICD-10-CM | POA: Insufficient documentation

## 2017-05-29 DIAGNOSIS — R739 Hyperglycemia, unspecified: Secondary | ICD-10-CM | POA: Insufficient documentation

## 2017-05-29 DIAGNOSIS — E039 Hypothyroidism, unspecified: Secondary | ICD-10-CM | POA: Insufficient documentation

## 2017-05-29 DIAGNOSIS — D649 Anemia, unspecified: Secondary | ICD-10-CM | POA: Insufficient documentation

## 2017-05-29 DIAGNOSIS — Z9049 Acquired absence of other specified parts of digestive tract: Secondary | ICD-10-CM | POA: Insufficient documentation

## 2017-05-29 DIAGNOSIS — E785 Hyperlipidemia, unspecified: Secondary | ICD-10-CM | POA: Insufficient documentation

## 2017-05-29 DIAGNOSIS — Z79899 Other long term (current) drug therapy: Secondary | ICD-10-CM | POA: Insufficient documentation

## 2017-05-29 DIAGNOSIS — M4800 Spinal stenosis, site unspecified: Secondary | ICD-10-CM | POA: Insufficient documentation

## 2017-05-29 DIAGNOSIS — I10 Essential (primary) hypertension: Secondary | ICD-10-CM | POA: Insufficient documentation

## 2017-05-29 DIAGNOSIS — M129 Arthropathy, unspecified: Secondary | ICD-10-CM | POA: Insufficient documentation

## 2017-05-29 DIAGNOSIS — I2510 Atherosclerotic heart disease of native coronary artery without angina pectoris: Secondary | ICD-10-CM | POA: Insufficient documentation

## 2017-05-29 DIAGNOSIS — Z510 Encounter for antineoplastic radiation therapy: Secondary | ICD-10-CM | POA: Insufficient documentation

## 2017-05-29 DIAGNOSIS — R599 Enlarged lymph nodes, unspecified: Secondary | ICD-10-CM | POA: Insufficient documentation

## 2017-05-29 DIAGNOSIS — K219 Gastro-esophageal reflux disease without esophagitis: Secondary | ICD-10-CM | POA: Insufficient documentation

## 2017-05-29 DIAGNOSIS — C155 Malignant neoplasm of lower third of esophagus: Secondary | ICD-10-CM | POA: Insufficient documentation

## 2017-05-30 ENCOUNTER — Encounter: Payer: Self-pay | Admitting: Oncology

## 2017-05-30 ENCOUNTER — Ambulatory Visit
Admission: RE | Admit: 2017-05-30 | Discharge: 2017-05-30 | Disposition: A | Discharge status: Home / Self Care | Payer: Medicare Other | Source: Ambulatory Visit | Attending: Radiation Oncology | Admitting: Radiation Oncology

## 2017-05-30 ENCOUNTER — Inpatient Hospital Stay: Payer: Medicare Other | Attending: Oncology | Admitting: Oncology

## 2017-05-30 VITALS — BP 134/74 | HR 70 | Temp 98.1°F | Resp 18 | Ht 71.0 in | Wt 184.0 lb

## 2017-05-30 DIAGNOSIS — C770 Secondary and unspecified malignant neoplasm of lymph nodes of head, face and neck: Secondary | ICD-10-CM | POA: Insufficient documentation

## 2017-05-30 DIAGNOSIS — Z7189 Other specified counseling: Secondary | ICD-10-CM

## 2017-05-30 DIAGNOSIS — Z5111 Encounter for antineoplastic chemotherapy: Secondary | ICD-10-CM | POA: Insufficient documentation

## 2017-05-30 DIAGNOSIS — R599 Enlarged lymph nodes, unspecified: Secondary | ICD-10-CM | POA: Diagnosis not present

## 2017-05-30 DIAGNOSIS — D649 Anemia, unspecified: Secondary | ICD-10-CM | POA: Diagnosis not present

## 2017-05-30 DIAGNOSIS — C155 Malignant neoplasm of lower third of esophagus: Secondary | ICD-10-CM

## 2017-05-30 DIAGNOSIS — C159 Malignant neoplasm of esophagus, unspecified: Secondary | ICD-10-CM

## 2017-05-30 NOTE — Progress Notes (Signed)
Met with Eddie Castaneda and his family during and after consults with Dr. Luiz Ochoa. Introduced nurse navigator services and provided contact information for future needs. Educated further on port a cath. Chemo class and Dietician scheduled at this visit. Oncology Nurse Navigator Documentation  Navigator Location: [facility] (05/30/17 1400)   )Navigator Encounter Type: Initial MedOnc;Initial RadOnc (05/30/17 1400)                 Multidisiplinary Clinic Type: GI (05/30/17 1400)   Patient Visit Type: MedOnc;RadOnc;Initial (05/30/17 1400) Treatment Phase: Pre-Tx/Tx Discussion (05/30/17 1400) Barriers/Navigation Needs: Education (05/30/17 1400)                Acuity: Level 2 (05/30/17 1400)         Time Spent with Patient: 30 (05/30/17 1400)

## 2017-05-30 NOTE — Consult Note (Signed)
NEW PATIENT EVALUATION  Name: Eddie Castaneda  MRN: [id number]  Date:   05/30/2017     DOB: April 19, 1934   This 84 y.o. 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  REFERRING PHYSICIAN: Adin Hector, MD  CHIEF COMPLAINT: No chief complaint on file.   DIAGNOSIS: There were no encounter diagnoses.   PREVIOUS INVESTIGATIONS:  Pathology reports reviewed Endoscopy report reviewed Clinical notes reviewed Pathology reports reviewed PET CT scan reviewed Case reported at weekly tumor board  HPI: Patient is a 84 year old male who presented with some increased dysphasia and odynophagia. He stated he felt food was sticking in the lower portion of esophagus. Barium swallow did not demonstrate significant stricture. He underwent upper endoscopy showing a medium-size fungating mass in the distal esophagus extending from 35-37 cm from the incisors. It was partially obstructing. 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 Patient underwent a PET CT scan again demonstrating hypermetabolic mass in the distal esophagus with an SUV of 10.7. Interestingly enough he had a hypermetabolic left periaortic lymph node actually 2 lymph nodes with significant hypermetabolic activity. Patient has lost very little weight is an excellent performance status for his age. He's been seen by medical oncology and I been asked to evaluate him for possible concurrent chemoradiation.  PLANNED TREATMENT REGIMEN: Concurrent chemoradiation  PAST MEDICAL HISTORY:  has a past medical history of Anemia, Arthritis, Coronary artery disease, Diverticulitis, Dysrhythmia, GERD (gastroesophageal reflux disease), History of hiatal hernia, Hyperglycemia, Hyperlipidemia, Hypertension, Hypothyroidism, and Spinal stenosis.    PAST SURGICAL HISTORY:  Past Surgical History:   Procedure Laterality Date  . BACK SURGERY    . CATARACT EXTRACTION W/PHACO Left 11/08/2014   Procedure: CATARACT EXTRACTION PHACO AND INTRAOCULAR LENS PLACEMENT (IOC);  Surgeon: Estill Cotta, MD;  Location: [facility] ORS;  Service: Ophthalmology;  Laterality: Left;  US:01:20 AP:21.8 CDE:31.09 PAC LOT: [id number]  . CHOLECYSTECTOMY    . ESOPHAGOGASTRODUODENOSCOPY (EGD) WITH PROPOFOL N/A 05/23/2017   Procedure: ESOPHAGOGASTRODUODENOSCOPY (EGD) WITH PROPOFOL;  Surgeon: Lollie Sails, MD;  Location: [facility] ENDOSCOPY;  Service: Endoscopy;  Laterality: N/A;  . EYE SURGERY    . JOINT REPLACEMENT Right    Knee  . ROTATOR CUFF REPAIR Right     FAMILY HISTORY: family history includes Colon cancer in his mother.  SOCIAL HISTORY:  reports that he quit smoking about 28 years ago. he has never used smokeless tobacco. He reports that he does not drink alcohol or use drugs.  ALLERGIES: Morphine and related  MEDICATIONS:  Current Outpatient Medications  Medication Sig Dispense Refill  . acetaminophen (TYLENOL) 650 MG CR tablet Take 650 mg by mouth every 8 (eight) hours as needed for pain.    Marland Kitchen ascorbic acid (VITAMIN C) 1000 MG tablet Take 1,000 mg by mouth daily.    Marland Kitchen aspirin 81 MG tablet Take 81 mg by mouth daily.    Marland Kitchen atorvastatin (LIPITOR) 10 MG tablet Take 10 mg by mouth daily.    Marland Kitchen BACILLUS COAGULANS-INULIN PO Take by mouth.    . docusate sodium (COLACE) 100 MG capsule Take 100 mg by mouth daily.    Marland Kitchen HYDROcodone-acetaminophen (NORCO) 5-325 MG tablet Take 1 tablet by mouth every 6 (six) hours as needed for moderate pain. (Patient not taking: Reported on 05/23/2017) 20 tablet 0  . levothyroxine (SYNTHROID, LEVOTHROID) 100 MCG tablet Take 25 mcg by mouth daily before breakfast.    .  levothyroxine (SYNTHROID, LEVOTHROID) 25 MCG tablet Take 25 mcg by mouth daily before breakfast.    . metoprolol tartrate (LOPRESSOR) 25 MG tablet Take 25 mg by mouth daily.    . Multiple Vitamin (MULTIVITAMIN)  tablet Take 1 tablet by mouth daily.    . Omega-3 1000 MG CAPS Take 340-1,000 capsules by mouth 2 (two) times daily.    Marland Kitchen omeprazole (PRILOSEC) 20 MG capsule Take 20 mg by mouth daily.    . pravastatin (PRAVACHOL) 20 MG tablet Take 20 mg by mouth daily.    . predniSONE (DELTASONE) 5 MG tablet Take 5 mg by mouth daily with breakfast.    . Probiotic Product (PROBIOTIC COMPLEX ACIDOPHILUS PO) Take by mouth.    . quinapril (ACCUPRIL) 40 MG tablet Take 40 mg by mouth 1 day or 1 dose.    . tamsulosin (FLOMAX) 0.4 MG CAPS capsule Take 0.4 mg by mouth daily.    . traMADol (ULTRAM-ER) 100 MG 24 hr tablet Take 50 mg by mouth 2 (two) times daily. With lunch and dinner     . traMADol (ULTRAM-ER) 300 MG 24 hr tablet Take 300 mg by mouth daily. AM dose    . vitamin B-12 (CYANOCOBALAMIN) 1000 MCG tablet Take 1,500 mcg by mouth daily.      No current facility-administered medications for this encounter.     ECOG PERFORMANCE STATUS:  1 - Symptomatic but completely ambulatory  REVIEW OF SYSTEMS: Except for the easy satiety and slight dysphasia Patient denies any weight loss, fatigue, weakness, fever, chills or night sweats. Patient denies any loss of vision, blurred vision. Patient denies any ringing  of the ears or hearing loss. No irregular heartbeat. Patient denies heart murmur or history of fainting. Patient denies any chest pain or pain radiating to her upper extremities. Patient denies any shortness of breath, difficulty breathing at night, cough or hemoptysis. Patient denies any swelling in the lower legs. Patient denies any nausea vomiting, vomiting of blood, or coffee ground material in the vomitus. Patient denies any stomach pain. Patient states has had normal bowel movements no significant constipation or diarrhea. Patient denies any dysuria, hematuria or significant nocturia. Patient denies any problems walking, swelling in the joints or loss of balance. Patient denies any skin changes, loss of hair or  loss of weight. Patient denies any excessive worrying or anxiety or significant depression. Patient denies any problems with insomnia. Patient denies excessive thirst, polyuria, polydipsia. Patient denies any swollen glands, patient denies easy bruising or easy bleeding. Patient denies any recent infections, allergies or URI. Patient "s visual fields have not changed significantly in recent time.    PHYSICAL EXAM: There were no vitals taken for this visit. Well-developed well-nourished patient in NAD. HEENT reveals PERLA, EOMI, discs not visualized.  Oral cavity is clear. No oral mucosal lesions are identified. Neck is clear without evidence of cervical or supraclavicular adenopathy. Lungs are clear to A&P. Cardiac examination is essentially unremarkable with regular rate and rhythm without murmur rub or thrill. Abdomen is benign with no organomegaly or masses noted. Motor sensory and DTR levels are equal and symmetric in the upper and lower extremities. Cranial nerves II through XII are grossly intact. Proprioception is intact. No peripheral adenopathy or edema is identified. No motor or sensory levels are noted. Crude visual fields are within normal range.  LABORATORY DATA: Pathology reports reviewed    RADIOLOGY RESULTS: CT scan PET CT scans reviewed   IMPRESSION: Probable's stage IV adenocarcinoma the distal esophagus with para-aortic  lymph node involvement in 84 year old male  PLAN: We'll present her case at our weekly tumor conference. We are trying to obtain tissue diagnosis of the periaortic nodes since it is an unusual lymph node distribution for metastatic disease and esophageal cancer. Despite that would advocate going ahead with concurrent chemoradiation to his distal esophagus. I would plan on delivering 5400 cGy in 30 fractions using IM RT radiation therapy treatment planning and delivery. I would choose I am RT to spare critical structures such as nearby bowel stomach spinal cord and  preserved normal lung volume. Risks and benefits of treatment including possible increased and dysphasia skin reaction fatigue alteration of blood counts and possible further difficulty with swallowing all were discussed in detail with the patient and his family.There will be extra effort by both professional staff as well as technical staff to coordinate and manage concurrent chemoradiation and ensuing side effects during his treatments. I have personally set up and ordered CT simulation for later this week. If patient has an excellent response may consider SB RT to his paregoric lymph node involvement. Patient family seem to comprehend my treatment plan well. We'll coordinate his chemotherapy with medical oncology. May change her treatment plan depending on the ability to biopsy his para-aortic lymph nodes.  I would like to take this opportunity to thank you for allowing me to participate in the care of your patient.Noreene Filbert, MD

## 2017-05-31 ENCOUNTER — Ambulatory Visit
Admission: RE | Admit: 2017-05-31 | Discharge: 2017-05-31 | Disposition: A | Discharge status: Home / Self Care | Payer: Medicare Other | Source: Ambulatory Visit | Attending: Radiation Oncology | Admitting: Radiation Oncology

## 2017-05-31 ENCOUNTER — Other Ambulatory Visit: Payer: Self-pay | Admitting: *Deleted

## 2017-05-31 ENCOUNTER — Encounter: Payer: Self-pay | Admitting: Oncology

## 2017-05-31 DIAGNOSIS — Z7189 Other specified counseling: Secondary | ICD-10-CM | POA: Insufficient documentation

## 2017-05-31 DIAGNOSIS — C159 Malignant neoplasm of esophagus, unspecified: Secondary | ICD-10-CM | POA: Insufficient documentation

## 2017-05-31 DIAGNOSIS — R599 Enlarged lymph nodes, unspecified: Secondary | ICD-10-CM | POA: Diagnosis not present

## 2017-05-31 DIAGNOSIS — E039 Hypothyroidism, unspecified: Secondary | ICD-10-CM | POA: Diagnosis not present

## 2017-05-31 DIAGNOSIS — I10 Essential (primary) hypertension: Secondary | ICD-10-CM | POA: Diagnosis not present

## 2017-05-31 DIAGNOSIS — C155 Malignant neoplasm of lower third of esophagus: Secondary | ICD-10-CM | POA: Diagnosis present

## 2017-05-31 DIAGNOSIS — Z800 Family history of malignant neoplasm of digestive organs: Secondary | ICD-10-CM | POA: Diagnosis not present

## 2017-05-31 DIAGNOSIS — K449 Diaphragmatic hernia without obstruction or gangrene: Secondary | ICD-10-CM | POA: Diagnosis not present

## 2017-05-31 DIAGNOSIS — K219 Gastro-esophageal reflux disease without esophagitis: Secondary | ICD-10-CM | POA: Diagnosis not present

## 2017-05-31 DIAGNOSIS — Z9049 Acquired absence of other specified parts of digestive tract: Secondary | ICD-10-CM | POA: Diagnosis not present

## 2017-05-31 DIAGNOSIS — E785 Hyperlipidemia, unspecified: Secondary | ICD-10-CM | POA: Diagnosis not present

## 2017-05-31 DIAGNOSIS — M129 Arthropathy, unspecified: Secondary | ICD-10-CM | POA: Diagnosis not present

## 2017-05-31 DIAGNOSIS — Z79899 Other long term (current) drug therapy: Secondary | ICD-10-CM | POA: Diagnosis not present

## 2017-05-31 DIAGNOSIS — Z8719 Personal history of other diseases of the digestive system: Secondary | ICD-10-CM | POA: Diagnosis not present

## 2017-05-31 DIAGNOSIS — R739 Hyperglycemia, unspecified: Secondary | ICD-10-CM | POA: Diagnosis not present

## 2017-05-31 DIAGNOSIS — D649 Anemia, unspecified: Secondary | ICD-10-CM | POA: Diagnosis not present

## 2017-05-31 DIAGNOSIS — M4800 Spinal stenosis, site unspecified: Secondary | ICD-10-CM | POA: Diagnosis not present

## 2017-05-31 DIAGNOSIS — Z7982 Long term (current) use of aspirin: Secondary | ICD-10-CM | POA: Diagnosis not present

## 2017-05-31 DIAGNOSIS — I2510 Atherosclerotic heart disease of native coronary artery without angina pectoris: Secondary | ICD-10-CM | POA: Diagnosis not present

## 2017-05-31 DIAGNOSIS — Z87891 Personal history of nicotine dependence: Secondary | ICD-10-CM | POA: Diagnosis not present

## 2017-05-31 DIAGNOSIS — Z510 Encounter for antineoplastic radiation therapy: Secondary | ICD-10-CM | POA: Diagnosis not present

## 2017-05-31 MED ORDER — PROCHLORPERAZINE MALEATE 10 MG PO TABS: 10.0000 mg | ORAL_TABLET | Freq: Four times a day (QID) | ORAL | 1 refills | Status: DC | PRN

## 2017-05-31 MED ORDER — DEXAMETHASONE 4 MG PO TABS: 8.0000 mg | ORAL_TABLET | Freq: Every day | ORAL | 1 refills | Status: DC

## 2017-05-31 MED ORDER — LORAZEPAM 0.5 MG PO TABS: 0.5000 mg | ORAL_TABLET | Freq: Four times a day (QID) | ORAL | 0 refills | Status: DC | PRN

## 2017-05-31 MED ORDER — LIDOCAINE-PRILOCAINE 2.5-2.5 % EX CREA: TOPICAL_CREAM | CUTANEOUS | 3 refills | Status: DC

## 2017-05-31 MED ORDER — ONDANSETRON HCL 8 MG PO TABS: 8.0000 mg | ORAL_TABLET | Freq: Two times a day (BID) | ORAL | 1 refills | Status: DC | PRN

## 2017-05-31 NOTE — Progress Notes (Signed)
Hematology/Oncology Consult note [facility] Telephone:([phone] [phone]  Patient Care Team: Adin Hector, MD as PCP - General (Internal Medicine) Clent Jacks, RN as Registered Nurse   Name of the patient: Eddie Castaneda  [id number]  09/15/1933    Reason for referral- esophageal adenocarcinoma   Referring physician- Dr. Gustavo Lah  Date of visit: 05/31/17   History of presenting illness-patient is a 84 year old male with a past medical history significant for GERD for which she was on omeprazole in the past.  He also has a history of multiple back surgeries and chronic back pain but is otherwise healthy and lives with his wife.  He is independent of his ADLs.  He was seen by [facility] clinic GI in August 2018 for symptoms of epigastric pain and some difficulty swallowing.  He denies any change in his appetite or unintentional weight loss.  He reports dysphagia to both solids and liquids.    Patient had a barium swallow in September 2018 which showed moderate sized reducible hiatal hernia without evidence of stricture or reflux or esophagitis.  More proximally no stricture or spasm was observed.  No laryngeal penetration of the barium  He continued to have symptoms and then underwent an upper endoscopy in January 2019 which showed a medium-sized fungating mass with no bleeding found in the distal esophagus 35-37 cm from the incisors.  Mass was nonobstructing, partially obstructing and not circumferential.  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  Angiolymphatic invasion was present.  Fundic gland polyp was noted in the stomach which was negative for H. pylori dysplasia and malignancy.  Patient underwent PET/CT scan on 05/28/2017 which showed small hypermetabolic mass in the distal esophagus with an SUV of 10.7.  Hypermetabolic left periaortic adenopathy compatible with malignancy.  Larger of the 2  involved lymph nodes with a maximum SUV of 16.1.  No other regions of tumor are identified.  He has been referred to [place] for further treatment options.  ECOG PS- 0  Pain scale- 0   Review of systems- Review of Systems  Constitutional: Negative for chills, fever, malaise/fatigue and weight loss.  HENT: Negative for congestion, ear discharge and nosebleeds.   Eyes: Negative for blurred vision.  Respiratory: Negative for cough, hemoptysis, sputum production, shortness of breath and wheezing.   Cardiovascular: Negative for chest pain, palpitations, orthopnea and claudication.  Gastrointestinal: Negative for abdominal pain, blood in stool, constipation, diarrhea, heartburn, melena, nausea and vomiting.       Difficulty swallowing  Genitourinary: Negative for dysuria, flank pain, frequency, hematuria and urgency.  Musculoskeletal: Positive for back pain. Negative for joint pain and myalgias.  Skin: Negative for rash.  Neurological: Negative for dizziness, tingling, focal weakness, seizures, weakness and headaches.  Endo/Heme/Allergies: Does not bruise/bleed easily.  Psychiatric/Behavioral: Negative for depression and suicidal ideas. The patient does not have insomnia.     Allergies  Allergen Reactions  . Morphine And Related Nausea Only    There are no active problems to display for this patient.    Past Medical History:  Diagnosis Date  . Anemia   . Arthritis   . Coronary artery disease   . Diverticulitis   . Dysrhythmia   . GERD (gastroesophageal reflux disease)   . History of hiatal hernia   . Hyperglycemia   . Hyperlipidemia   . Hypertension   . Hypothyroidism   . Spinal stenosis      Past Surgical History:  Procedure Laterality  Date  . BACK SURGERY    . CATARACT EXTRACTION W/PHACO Left 11/08/2014   Procedure: CATARACT EXTRACTION PHACO AND INTRAOCULAR LENS PLACEMENT (IOC);  Surgeon: Estill Cotta, MD;  Location: [facility] ORS;  Service: Ophthalmology;  Laterality:  Left;  US:01:20 AP:21.8 CDE:31.09 PAC LOT: [id number]  . CHOLECYSTECTOMY    . ESOPHAGOGASTRODUODENOSCOPY (EGD) WITH PROPOFOL N/A 05/23/2017   Procedure: ESOPHAGOGASTRODUODENOSCOPY (EGD) WITH PROPOFOL;  Surgeon: Lollie Sails, MD;  Location: [facility] ENDOSCOPY;  Service: Endoscopy;  Laterality: N/A;  . EYE SURGERY    . JOINT REPLACEMENT Right    Knee  . ROTATOR CUFF REPAIR Right     Social History   Socioeconomic History  . Marital status: Married    Spouse name: Not on file  . Number of children: Not on file  . Years of education: Not on file  . Highest education level: Not on file  Social Needs  . Financial resource strain: Not on file  . Food insecurity - worry: Not on file  . Food insecurity - inability: Not on file  . Transportation needs - medical: Not on file  . Transportation needs - non-medical: Not on file  Occupational History  . Not on file  Tobacco Use  . Smoking status: Former Smoker    Last attempt to quit: 03/24/1989    Years since quitting: 28.2  . Smokeless tobacco: Never Used  Substance and Sexual Activity  . Alcohol use: No    Frequency: Never  . Drug use: No  . Sexual activity: Not on file  Other Topics Concern  . Not on file  Social History Narrative  . Not on file     Family History  Problem Relation Age of Onset  . Colon cancer Mother      Current Outpatient Medications:  .  acetaminophen (TYLENOL) 650 MG CR tablet, Take 650 mg by mouth every 8 (eight) hours as needed for pain., Disp: , Rfl:  .  ascorbic acid (VITAMIN C) 1000 MG tablet, Take 1,000 mg by mouth daily., Disp: , Rfl:  .  aspirin 81 MG tablet, Take 81 mg by mouth daily., Disp: , Rfl:  .  docusate sodium (COLACE) 100 MG capsule, Take 100 mg by mouth daily., Disp: , Rfl:  .  levothyroxine (SYNTHROID, LEVOTHROID) 25 MCG tablet, Take 25 mcg by mouth daily before breakfast., Disp: , Rfl:  .  metoprolol tartrate (LOPRESSOR) 25 MG tablet, Take 25 mg by mouth daily., Disp: , Rfl:  .   Multiple Vitamin (MULTIVITAMIN) tablet, Take 1 tablet by mouth daily., Disp: , Rfl:  .  Omega-3 1000 MG CAPS, Take 340-1,000 capsules by mouth 2 (two) times daily., Disp: , Rfl:  .  omeprazole (PRILOSEC) 20 MG capsule, Take 20 mg by mouth daily., Disp: , Rfl:  .  pravastatin (PRAVACHOL) 20 MG tablet, Take 20 mg by mouth daily., Disp: , Rfl:  .  predniSONE (DELTASONE) 5 MG tablet, Take 5 mg by mouth daily with breakfast., Disp: , Rfl:  .  Probiotic Product (PROBIOTIC COMPLEX ACIDOPHILUS PO), Take by mouth., Disp: , Rfl:  .  quinapril (ACCUPRIL) 40 MG tablet, Take 40 mg by mouth 1 day or 1 dose., Disp: , Rfl:  .  tamsulosin (FLOMAX) 0.4 MG CAPS capsule, Take 0.4 mg by mouth daily., Disp: , Rfl:  .  traMADol (ULTRAM-ER) 100 MG 24 hr tablet, Take 50 mg by mouth 2 (two) times daily. With lunch and dinner , Disp: , Rfl:  .  traMADol (ULTRAM-ER) 300 MG  24 hr tablet, Take 300 mg by mouth daily. AM dose, Disp: , Rfl:  .  atorvastatin (LIPITOR) 10 MG tablet, Take 10 mg by mouth daily., Disp: , Rfl:  .  BACILLUS COAGULANS-INULIN PO, Take by mouth., Disp: , Rfl:  .  HYDROcodone-acetaminophen (NORCO) 5-325 MG tablet, Take 1 tablet by mouth every 6 (six) hours as needed for moderate pain. (Patient not taking: Reported on 05/23/2017), Disp: 20 tablet, Rfl: 0 .  levothyroxine (SYNTHROID, LEVOTHROID) 100 MCG tablet, Take 25 mcg by mouth daily before breakfast., Disp: , Rfl:  .  vitamin B-12 (CYANOCOBALAMIN) 1000 MCG tablet, Take 1,500 mcg by mouth daily. , Disp: , Rfl:    Physical exam:  Vitals:   05/30/17 1056  BP: 134/74  Pulse: 70  Resp: 18  Temp: 98.1 F (36.7 C)  TempSrc: Oral  Weight: 184 lb (83.5 kg)  Height: _0  (1.803 m)   Physical Exam  Constitutional: He is oriented to person, place, and time and well-developed, well-nourished, and in no distress.  He is mildly obese  HENT:  Head: Normocephalic and atraumatic.  Eyes: EOM are normal. Pupils are equal, round, and reactive to light.    Neck: Normal range of motion.  Cardiovascular: Normal rate, regular rhythm and normal heart sounds.  Pulmonary/Chest: Effort normal and breath sounds normal.  Abdominal: Soft. Bowel sounds are normal.  Neurological: He is alert and oriented to person, place, and time.  Skin: Skin is warm and dry.       CMP Latest Ref Rng & Units 05/11/2012  Glucose 65 - 99 mg/dL 99  BUN 7 - 18 mg/dL 18  Creatinine 0.60 - 1.30 mg/dL 1.35(H)  Sodium 136 - 145 mmol/L 138  Potassium 3.5 - 5.1 mmol/L 3.9  Chloride 98 - 107 mmol/L 105  CO2 21 - 32 mmol/L 26  Calcium 8.5 - 10.1 mg/dL 9.0   CBC Latest Ref Rng & Units 05/11/2012  WBC 3.8 - 10.6 x10 3/mm 3 9.4  Hemoglobin 13.0 - 18.0 g/dL 10.4(L)  Hematocrit 40.0 - 52.0 % 30.0(L)  Platelets 150 - 440 x10 3/mm 3 335    No images are attached to the encounter.  Nm Pet Image Initial (pi) Skull Base To Thigh  Result Date: 05/28/2017 CLINICAL DATA:  Initial treatment strategy for esophageal melanoma. EXAM: NUCLEAR MEDICINE PET SKULL BASE TO THIGH TECHNIQUE: 13.8 mCi F-18 FDG was injected intravenously. Full-ring PET imaging was performed from the skull base to thigh after the radiotracer. CT data was obtained and used for attenuation correction and anatomic localization. FASTING BLOOD GLUCOSE:  Value: 87 mg/dl COMPARISON:  Multiple exams, including CT scans from 08/07/2015, 05/09/2012, and 08/27/2007 FINDINGS: NECK No hypermetabolic lymph nodes in the neck. CHEST Hypermetabolic distal esophageal mass has a maximum standard uptake value of 10.7. There is an adjacent 5 mm in short axis paraesophageal lymph node on image 121/3 which is not perceptibly hypermetabolic. Calcified mitral and aortic valves. Coronary, aortic arch, and branch vessel atherosclerotic vascular disease. ABDOMEN/PELVIS Small focus of questionably accentuated metabolic activity in the gastric cardia, maximum SUV 6.1. Left para-aortic lymph node measures 1.5 cm in short axis 16.1. A smaller left  periaortic lymph node slightly further cephalad on image 169/3 measures 0.8 cm in short axis with maximum SUV 10.5. Cholecystectomy noted. Aortoiliac atherosclerotic vascular disease. Posterior bladder diverticulum noted. Right scrotal hydrocele. SKELETON No focal hypermetabolic activity to suggest skeletal metastasis. IMPRESSION: 1. Small hypermetabolic mass in the distal esophagus, maximum SUV 10.7. 2. Hypermetabolic left periaortic  adenopathy compatible with malignancy. The larger of the 2 involved lymph nodes has a maximum SUV of 16.1. 3. Subtle focus of accentuated metabolic activity along the gastric cardia, quite possibly physiologic. 4. No other regions of tumor are identified. Please note that today's exam was performed as a skull base through proximal femur examination, and did not include the distal extremities or the top of the head. 5. Aortic Atherosclerosis (ICD10-I70.0). Coronary atherosclerosis with calcified mitral and aortic valves. Electronically Signed   By: Van Clines M.D.   On: 05/28/2017 11:29    Assessment and plan- Patient is a 84 y.o. male with newly diagnosed poorly differentiated adenocarcinoma of the distal esophagus  I discussed the results of the EGD pathology and PET/CT scan with the patient in detail.  I have also personally reviewed the PET/CT images independently.  Patient known to have a distal esophageal mass.  There was no evidence of locoregional adenopathy noted on the PET/CT scan.  He was found to have hypermetabolic periaortic and para-aortic lymph node which would not be considered as a regional lymph node in esophageal cancer staging making this technically M1 disease (Stage IV)  I have discussed his case with Dr. Donella Stade from radiation oncology who will also be seeing the patient later today.  I have also discussed his case at the tumor board.  Hypermetabolic peri-and periaortic adenopathy is highly concerning for metastatic esophageal cancer although it is  a little unusual to find no local regional adenopathy but rather a skipped nodal involvement outside the regional field.  At this point I will proceed with an IR guided biopsy of the periaortic lymph node.  If the lymph node is positive for esophageal cancer, I do not think that patient would be a surgical candidate especially also given his age and M22 status.  If lymph node biopsy is negative for esophageal cancer, I will consider proceeding with EUS at that point for locoregional staging and surgical referral.    Regardless of lymph node biopsy results-upon discussion with radiation oncology -plan at this point is to proceed with definitive concurrent chemoradiation per CROSS trial with carboplatin AUC 2 and Taxol at 50 mg/m square.  Discussed risks and benefits of chemotherapy including all but not limited to nausea, vomiting, fatigue, low blood counts and risk of infections and hospitalizations.  Risk of peripheral neuropathy and infusion reaction associated with Taxol.  Chemotherapy will be given with the palliative intent.  Patient understands and agrees to proceed as planned.  Chemotherapy will be given once a week concurrently with radiation which would be for 5 weeks.  Following completion of chemoradiation Dr. Donella Stade plans to give SBRT to the involved lymph nodes noted on the PET/CT scan as well.  I will arrange for port placement over the next 2 weeks as well as chemo teach.  We will tentatively plan to start chemotherapy in 2 weeks time  Patient does not have any significant dysphagia and is able to eat normal diet as well as drink his liquids.  I therefore do not think the patient needs a prophylactic PEG tube or a J-tube at this time  Patient also currently denies any significant pain or discomfort  Will refer patient to nutrition as well.   Check her 2 neu and PDL1 on tumor specimen  Cancer Staging Malignant neoplasm of esophagus [facility]) Staging form: Esophagus - Adenocarcinoma, AJCC  8th Edition - Clinical stage from 05/30/2017: Stage IVB (cTX, cNX, cM1, G3) - Signed by Sindy Guadeloupe, MD  on 05/31/2017     Thank you for this kind referral and the opportunity to participate in the care of this patient   Visit Diagnosis 1. Malignant neoplasm of esophagus, unspecified location [facility])     Dr. Randa Evens, MD, MPH [facility] at [facility] Pager- [phone] 05/31/2017 2:01 PM

## 2017-05-31 NOTE — Patient Instructions (Signed)

## 2017-06-04 ENCOUNTER — Inpatient Hospital Stay: Payer: Medicare Other

## 2017-06-04 ENCOUNTER — Other Ambulatory Visit: Payer: Self-pay | Admitting: *Deleted

## 2017-06-04 DIAGNOSIS — Z510 Encounter for antineoplastic radiation therapy: Secondary | ICD-10-CM | POA: Diagnosis not present

## 2017-06-04 DIAGNOSIS — C159 Malignant neoplasm of esophagus, unspecified: Secondary | ICD-10-CM

## 2017-06-04 NOTE — Progress Notes (Signed)
Notified Mr. Bullinger regarding port placement, [facility] with arrival time of 0800. He understands that we are in the process of scheduling biopsy of lymph nodes and will call him once arranged. Instructed not to eat or drink anything after midnight. Oncology Nurse Navigator Documentation  Navigator Location: [facility] (06/04/17 1000)   )Navigator Encounter Type: Other (06/04/17 1000) Telephone: Appt Confirmation/Clarification (06/04/17 1000)                                                  Time Spent with Patient: 15 (06/04/17 1000)

## 2017-06-05 ENCOUNTER — Telehealth: Payer: Self-pay | Admitting: *Deleted

## 2017-06-05 ENCOUNTER — Other Ambulatory Visit: Payer: Self-pay | Admitting: *Deleted

## 2017-06-05 ENCOUNTER — Other Ambulatory Visit: Payer: Self-pay | Admitting: Radiology

## 2017-06-05 DIAGNOSIS — R948 Abnormal results of function studies of other organs and systems: Secondary | ICD-10-CM

## 2017-06-05 DIAGNOSIS — C159 Malignant neoplasm of esophagus, unspecified: Secondary | ICD-10-CM

## 2017-06-05 NOTE — Telephone Encounter (Signed)
Called pt and let him know that they can do the bx tom am. [facility] 7:30 for a 8:30 procedure. He knows to be NPO after midnight and have a driver to take him home. He will be there approximately 2-4 hours.  I encouraged him to drink plenty of fluids because he has 2 come 2 days in a row. He needs to eat snack at night before so he has as many calories to intake.

## 2017-06-06 ENCOUNTER — Ambulatory Visit
Admission: RE | Admit: 2017-06-06 | Discharge: 2017-06-06 | Disposition: A | Discharge status: Home / Self Care | Payer: Medicare Other | Source: Ambulatory Visit | Attending: Oncology | Admitting: Oncology

## 2017-06-06 DIAGNOSIS — Z79890 Hormone replacement therapy (postmenopausal): Secondary | ICD-10-CM | POA: Diagnosis not present

## 2017-06-06 DIAGNOSIS — E785 Hyperlipidemia, unspecified: Secondary | ICD-10-CM | POA: Insufficient documentation

## 2017-06-06 DIAGNOSIS — C155 Malignant neoplasm of lower third of esophagus: Secondary | ICD-10-CM | POA: Insufficient documentation

## 2017-06-06 DIAGNOSIS — C159 Malignant neoplasm of esophagus, unspecified: Secondary | ICD-10-CM

## 2017-06-06 DIAGNOSIS — Z96651 Presence of right artificial knee joint: Secondary | ICD-10-CM | POA: Insufficient documentation

## 2017-06-06 DIAGNOSIS — C772 Secondary and unspecified malignant neoplasm of intra-abdominal lymph nodes: Secondary | ICD-10-CM | POA: Insufficient documentation

## 2017-06-06 DIAGNOSIS — M1990 Unspecified osteoarthritis, unspecified site: Secondary | ICD-10-CM | POA: Diagnosis not present

## 2017-06-06 DIAGNOSIS — Z87891 Personal history of nicotine dependence: Secondary | ICD-10-CM | POA: Insufficient documentation

## 2017-06-06 DIAGNOSIS — I10 Essential (primary) hypertension: Secondary | ICD-10-CM | POA: Diagnosis not present

## 2017-06-06 DIAGNOSIS — Z79899 Other long term (current) drug therapy: Secondary | ICD-10-CM | POA: Insufficient documentation

## 2017-06-06 DIAGNOSIS — Z7982 Long term (current) use of aspirin: Secondary | ICD-10-CM | POA: Diagnosis not present

## 2017-06-06 DIAGNOSIS — Z7952 Long term (current) use of systemic steroids: Secondary | ICD-10-CM | POA: Diagnosis not present

## 2017-06-06 DIAGNOSIS — E039 Hypothyroidism, unspecified: Secondary | ICD-10-CM | POA: Insufficient documentation

## 2017-06-06 DIAGNOSIS — Z885 Allergy status to narcotic agent status: Secondary | ICD-10-CM | POA: Insufficient documentation

## 2017-06-06 DIAGNOSIS — K219 Gastro-esophageal reflux disease without esophagitis: Secondary | ICD-10-CM | POA: Insufficient documentation

## 2017-06-06 DIAGNOSIS — Z800 Family history of malignant neoplasm of digestive organs: Secondary | ICD-10-CM | POA: Insufficient documentation

## 2017-06-06 DIAGNOSIS — R590 Localized enlarged lymph nodes: Secondary | ICD-10-CM | POA: Diagnosis present

## 2017-06-06 DIAGNOSIS — R739 Hyperglycemia, unspecified: Secondary | ICD-10-CM | POA: Insufficient documentation

## 2017-06-06 DIAGNOSIS — R948 Abnormal results of function studies of other organs and systems: Secondary | ICD-10-CM

## 2017-06-06 HISTORY — DX: Cardiac murmur, unspecified: R01.1

## 2017-06-06 LAB — PROTIME-INR
INR: 0.93
Prothrombin Time: 12.4 seconds (ref 11.4–15.2)

## 2017-06-06 LAB — CBC
HEMATOCRIT: 34.3 % — AB (ref 40.0–52.0)
Hemoglobin: 11.6 g/dL — ABNORMAL LOW (ref 13.0–18.0)
MCH: 33.5 pg (ref 26.0–34.0)
MCHC: 33.8 g/dL (ref 32.0–36.0)
MCV: 99.2 fL (ref 80.0–100.0)
PLATELETS: 250 10*3/uL (ref 150–440)
RBC: 3.46 MIL/uL — ABNORMAL LOW (ref 4.40–5.90)
RDW: 13.2 % (ref 11.5–14.5)
WBC: 6.9 10*3/uL (ref 3.8–10.6)

## 2017-06-06 LAB — APTT: aPTT: 33 seconds (ref 24–36)

## 2017-06-06 MED ORDER — CEFAZOLIN SODIUM-DEXTROSE 2-4 GM/100ML-% IV SOLN
2.0000 g | INTRAVENOUS | Status: DC
  Filled 2017-06-06: qty 100

## 2017-06-06 MED ORDER — SODIUM CHLORIDE 0.9 % IV SOLN
INTRAVENOUS | Status: DC
  Administered 2017-06-06: 08:00:00 via INTRAVENOUS

## 2017-06-06 MED ORDER — LIDOCAINE HCL (PF) 1 % IJ SOLN
INTRAMUSCULAR | Status: AC | PRN
  Administered 2017-06-06: 10 mL

## 2017-06-06 MED ORDER — MIDAZOLAM HCL 5 MG/5ML IJ SOLN
INTRAMUSCULAR | Status: AC | PRN
Start: 2017-06-06 — End: 2017-06-06
  Administered 2017-06-06 (×2): 1 mg via INTRAVENOUS

## 2017-06-06 MED ORDER — MIDAZOLAM HCL 5 MG/5ML IJ SOLN
INTRAMUSCULAR | Status: AC
  Filled 2017-06-06: qty 5

## 2017-06-06 MED ORDER — FENTANYL CITRATE (PF) 100 MCG/2ML IJ SOLN
INTRAMUSCULAR | Status: AC
  Filled 2017-06-06: qty 4

## 2017-06-06 MED ORDER — FENTANYL CITRATE (PF) 100 MCG/2ML IJ SOLN
INTRAMUSCULAR | Status: AC | PRN
  Administered 2017-06-06: 25 ug via INTRAVENOUS
  Administered 2017-06-06: 50 ug via INTRAVENOUS

## 2017-06-06 MED ORDER — SODIUM CHLORIDE 0.9 % IV SOLN: INTRAVENOUS | Status: DC

## 2017-06-06 NOTE — H&P (Signed)
Chief Complaint:   Here for CT biopsy of RP lymph node  Referring Physician(s): Rao,Archana C     Patient Status: [facility] - Out-pt  History of Present Illness: Eddie Castaneda is a 84 y.o. 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  Plan for CT bx of adenopathy today.   Past Medical History:  Diagnosis Date  . Anemia   . Arthritis   . Coronary artery disease   . Diverticulitis   . Dysrhythmia   . GERD (gastroesophageal reflux disease)   . Heart murmur   . History of hiatal hernia   . Hyperglycemia   . Hyperlipidemia   . Hypertension   . Hypothyroidism   . Spinal stenosis     Past Surgical History:  Procedure Laterality Date  . BACK SURGERY    . CATARACT EXTRACTION W/PHACO Left 11/08/2014   Procedure: CATARACT EXTRACTION PHACO AND INTRAOCULAR LENS PLACEMENT (IOC);  Surgeon: Estill Cotta, MD;  Location: [facility] ORS;  Service: Ophthalmology;  Laterality: Left;  US:01:20 AP:21.8 CDE:31.09 PAC LOT: [id number]  . CHOLECYSTECTOMY    . ESOPHAGOGASTRODUODENOSCOPY (EGD) WITH PROPOFOL N/A 05/23/2017   Procedure: ESOPHAGOGASTRODUODENOSCOPY (EGD) WITH PROPOFOL;  Surgeon: Lollie Sails, MD;  Location: [facility] ENDOSCOPY;  Service: Endoscopy;  Laterality: N/A;  . EYE SURGERY    . JOINT REPLACEMENT Right    Knee  . ROTATOR CUFF REPAIR Right     Allergies: Morphine and related  Medications: Prior to Admission medications   Medication Sig Start Date End Date Taking? Authorizing Provider  acetaminophen (TYLENOL) 650 MG CR tablet Take 650 mg by mouth every 8 (eight) hours as needed for pain.   Yes [provider]  ascorbic acid (VITAMIN C) 1000 MG tablet Take 1,000 mg by mouth daily.   Yes [provider]  cholecalciferol (VITAMIN D) 1000 units tablet Take 1,000 Units by mouth daily.   Yes [provider]  co-enzyme Q-10 30 MG capsule Take 30 mg by mouth daily.   Yes [provider]    docusate sodium (COLACE) 100 MG capsule Take 100 mg by mouth daily.   Yes [provider]  levothyroxine (SYNTHROID, LEVOTHROID) 100 MCG tablet Take 25 mcg by mouth daily before breakfast.   Yes [provider]  levothyroxine (SYNTHROID, LEVOTHROID) 25 MCG tablet Take 25 mcg by mouth daily before breakfast.   Yes [provider]  metoprolol tartrate (LOPRESSOR) 25 MG tablet Take 25 mg by mouth daily.   Yes [provider]  Multiple Vitamin (MULTIVITAMIN) tablet Take 1 tablet by mouth daily.   Yes [provider]  Omega-3 1000 MG CAPS Take 340-1,000 capsules by mouth 2 (two) times daily.   Yes [provider]  omeprazole (PRILOSEC) 20 MG capsule Take 20 mg by mouth daily.   Yes [provider]  pravastatin (PRAVACHOL) 20 MG tablet Take 20 mg by mouth daily.   Yes [provider]  predniSONE (DELTASONE) 5 MG tablet Take 5 mg by mouth daily with breakfast.   Yes [provider]  Probiotic Product (PROBIOTIC COMPLEX ACIDOPHILUS PO) Take by mouth.   Yes [provider]  quinapril (ACCUPRIL) 40 MG tablet Take 40 mg by mouth 1 day or 1 dose.   Yes [provider]  tamsulosin (FLOMAX) 0.4 MG CAPS capsule Take 0.4 mg by mouth daily.   Yes [provider]  traMADol (ULTRAM-ER) 100 MG 24 hr tablet Take 50 mg by  mouth 2 (two) times daily. With lunch and dinner    Yes [provider]  traMADol (ULTRAM-ER) 300 MG 24 hr tablet Take 300 mg by mouth daily. AM dose   Yes [provider]  vitamin B-12 (CYANOCOBALAMIN) 1000 MCG tablet Take 1,500 mcg by mouth daily.    Yes [provider]  aspirin 81 MG tablet Take 81 mg by mouth daily.    [provider]  atorvastatin (LIPITOR) 10 MG tablet Take 10 mg by mouth daily.    [provider]  BACILLUS COAGULANS-INULIN PO Take by mouth.    [provider]  dexamethasone (DECADRON) 4 MG tablet Take 2 tablets (8 mg  total) by mouth daily. Start the day after chemotherapy for 2 days. 05/31/17   Sindy Guadeloupe, MD  HYDROcodone-acetaminophen (NORCO) 5-325 MG tablet Take 1 tablet by mouth every 6 (six) hours as needed for moderate pain. Patient not taking: Reported on 05/23/2017 08/07/15   Daymon Larsen, MD  lidocaine-prilocaine (EMLA) cream Apply to affected area once 05/31/17   Sindy Guadeloupe, MD  LORazepam (ATIVAN) 0.5 MG tablet Take 1 tablet (0.5 mg total) by mouth every 6 (six) hours as needed (Nausea or vomiting). 05/31/17   Sindy Guadeloupe, MD  ondansetron (ZOFRAN) 8 MG tablet Take 1 tablet (8 mg total) by mouth 2 (two) times daily as needed for refractory nausea / vomiting. Start on day 3 after chemo. 05/31/17   Sindy Guadeloupe, MD  prochlorperazine (COMPAZINE) 10 MG tablet Take 1 tablet (10 mg total) by mouth every 6 (six) hours as needed (Nausea or vomiting). 05/31/17   Sindy Guadeloupe, MD     Family History  Problem Relation Age of Onset  . Colon cancer Mother     Social History   Socioeconomic History  . Marital status: Married    Spouse name: None  . Number of children: None  . Years of education: None  . Highest education level: None  Social Needs  . Financial resource strain: None  . Food insecurity - worry: None  . Food insecurity - inability: None  . Transportation needs - medical: None  . Transportation needs - non-medical: None  Occupational History  . None  Tobacco Use  . Smoking status: Former Smoker    Last attempt to quit: 03/24/1989    Years since quitting: 28.2  . Smokeless tobacco: Never Used  Substance and Sexual Activity  . Alcohol use: No    Frequency: Never  . Drug use: No  . Sexual activity: None  Other Topics Concern  . None  Social History Narrative  . None    ECOG Status: 1 - Symptomatic but completely ambulatory  Review of Systems: A 12 point ROS discussed and pertinent positives are indicated in the HPI above.  All other systems are negative.  Review of  Systems  Vital Signs: BP (!) 168/82   Pulse 77   Temp 99 F (37.2 C) (Oral)   Resp 13   SpO2 99%   Physical Exam  Constitutional: He is oriented to person, place, and time. He appears well-developed and well-nourished. No distress.  Eyes: Conjunctivae are normal. No scleral icterus.  Cardiovascular: Normal rate, regular rhythm and normal heart sounds.  Pulmonary/Chest: Effort normal and breath sounds normal.  Abdominal: Soft. Bowel sounds are normal.  Neurological: He is alert and oriented to person, place, and time.  Skin: Skin is warm and dry. No rash noted. He is not diaphoretic.  Psychiatric:  He has a normal mood and affect. His behavior is normal.    Imaging: Nm Pet Image Initial (pi) Skull Base To Thigh  Result Date: 05/28/2017 CLINICAL DATA:  Initial treatment strategy for esophageal melanoma. EXAM: NUCLEAR MEDICINE PET SKULL BASE TO THIGH TECHNIQUE: 13.8 mCi F-18 FDG was injected intravenously. Full-ring PET imaging was performed from the skull base to thigh after the radiotracer. CT data was obtained and used for attenuation correction and anatomic localization. FASTING BLOOD GLUCOSE:  Value: 87 mg/dl COMPARISON:  Multiple exams, including CT scans from 08/07/2015, 05/09/2012, and 08/27/2007 FINDINGS: NECK No hypermetabolic lymph nodes in the neck. CHEST Hypermetabolic distal esophageal mass has a maximum standard uptake value of 10.7. There is an adjacent 5 mm in short axis paraesophageal lymph node on image 121/3 which is not perceptibly hypermetabolic. Calcified mitral and aortic valves. Coronary, aortic arch, and branch vessel atherosclerotic vascular disease. ABDOMEN/PELVIS Small focus of questionably accentuated metabolic activity in the gastric cardia, maximum SUV 6.1. Left para-aortic lymph node measures 1.5 cm in short axis 16.1. A smaller left periaortic lymph node slightly further cephalad on image 169/3 measures 0.8 cm in short axis with maximum SUV 10.5. Cholecystectomy  noted. Aortoiliac atherosclerotic vascular disease. Posterior bladder diverticulum noted. Right scrotal hydrocele. SKELETON No focal hypermetabolic activity to suggest skeletal metastasis. IMPRESSION: 1. Small hypermetabolic mass in the distal esophagus, maximum SUV 10.7. 2. Hypermetabolic left periaortic adenopathy compatible with malignancy. The larger of the 2 involved lymph nodes has a maximum SUV of 16.1. 3. Subtle focus of accentuated metabolic activity along the gastric cardia, quite possibly physiologic. 4. No other regions of tumor are identified. Please note that today's exam was performed as a skull base through proximal femur examination, and did not include the distal extremities or the top of the head. 5. Aortic Atherosclerosis (ICD10-I70.0). Coronary atherosclerosis with calcified mitral and aortic valves. Electronically Signed   By: Van Clines M.D.   On: 05/28/2017 11:29    Labs:  CBC: Recent Labs    06/06/17 0738  WBC 6.9  HGB 11.6*  HCT 34.3*  PLT 250    COAGS: Recent Labs    06/06/17 0738  INR 0.93  APTT 33    BMP: No results for input(s): NA, K, CL, CO2, GLUCOSE, BUN, CALCIUM, CREATININE, GFRNONAA, GFRAA in the last 8760 hours.  Invalid input(s): CMP  LIVER FUNCTION TESTS: No results for input(s): BILITOT, AST, ALT, ALKPHOS, PROT, ALBUMIN in the last 8760 hours.  TUMOR MARKERS: No results for input(s): AFPTM, CEA, CA199, CHROMGRNA in the last 8760 hours.  Assessment and Plan:  Abnormal left RP paraaortic adenopathy by PET CT.  Plan for CT biopsy today.  Consent obtained  Risks and benefits discussed with the patient including, but not limited to bleeding, infection, damage to adjacent structures or low yield requiring additional tests.  All of the patient's questions were answered, patient is agreeable to proceed. Consent signed and in chart.    Thank you for this interesting consult.  I greatly enjoyed meeting Eddie Castaneda and look forward  to participating in their care.  A copy of this report was sent to the requesting provider on this date.  Electronically Signed: Greggory Keen, MD 06/06/2017, 8:27 AM   I spent a total of  15 Minutes   in face to face in clinical consultation, greater than 50% of which was counseling/coordinating care for this patient with esophageal melanoma

## 2017-06-06 NOTE — Procedures (Signed)
Esophageal melanoma  S/p LEFT RP PARAAORTIC NODE CORE BX  No comp Stable Path pending Full report in pacs

## 2017-06-07 ENCOUNTER — Ambulatory Visit: Payer: No Typology Code available for payment source

## 2017-06-07 ENCOUNTER — Other Ambulatory Visit: Payer: Self-pay | Admitting: Pathology

## 2017-06-07 ENCOUNTER — Ambulatory Visit
Admission: RE | Admit: 2017-06-07 | Discharge: 2017-06-07 | Disposition: A | Discharge status: Home / Self Care | Payer: Medicare Other | Source: Ambulatory Visit | Attending: Oncology | Admitting: Oncology

## 2017-06-07 DIAGNOSIS — C155 Malignant neoplasm of lower third of esophagus: Secondary | ICD-10-CM | POA: Diagnosis not present

## 2017-06-07 DIAGNOSIS — C159 Malignant neoplasm of esophagus, unspecified: Secondary | ICD-10-CM

## 2017-06-07 HISTORY — PX: IR FLUORO GUIDE PORT INSERTION RIGHT: IMG5741

## 2017-06-07 LAB — SURGICAL PATHOLOGY

## 2017-06-07 MED ORDER — FENTANYL CITRATE (PF) 100 MCG/2ML IJ SOLN
INTRAMUSCULAR | Status: AC
  Filled 2017-06-07: qty 2

## 2017-06-07 MED ORDER — MIDAZOLAM HCL 2 MG/2ML IJ SOLN
INTRAMUSCULAR | Status: AC
Start: 2017-06-07 — End: 2017-06-07
  Filled 2017-06-07: qty 4

## 2017-06-07 MED ORDER — FENTANYL CITRATE (PF) 100 MCG/2ML IJ SOLN
INTRAMUSCULAR | Status: AC | PRN
  Administered 2017-06-07 (×2): 50 ug via INTRAVENOUS

## 2017-06-07 MED ORDER — LIDOCAINE HCL (PF) 1 % IJ SOLN
INTRAMUSCULAR | Status: AC
  Filled 2017-06-07: qty 30

## 2017-06-07 MED ORDER — SODIUM CHLORIDE 0.9 % IV SOLN
INTRAVENOUS | Status: DC
  Administered 2017-06-07: 08:00:00 via INTRAVENOUS

## 2017-06-07 MED ORDER — MIDAZOLAM HCL 2 MG/2ML IJ SOLN
INTRAMUSCULAR | Status: AC | PRN
  Administered 2017-06-07 (×3): 1 mg via INTRAVENOUS

## 2017-06-07 MED ORDER — HEPARIN SOD (PORK) LOCK FLUSH 100 UNIT/ML IV SOLN
INTRAVENOUS | Status: AC
  Filled 2017-06-07: qty 5

## 2017-06-07 MED ORDER — HEPARIN (PORCINE) IN NACL 2-0.9 UNIT/ML-% IJ SOLN
INTRAMUSCULAR | Status: AC
  Filled 2017-06-07: qty 500

## 2017-06-07 MED ORDER — CEFAZOLIN SODIUM-DEXTROSE 2-4 GM/100ML-% IV SOLN
2.0000 g | Freq: Once | INTRAVENOUS | Status: AC
  Administered 2017-06-07: 2 g via INTRAVENOUS

## 2017-06-07 NOTE — Procedures (Signed)
Interventional Radiology Procedure Note  Procedure: Single Lumen Power Port Placement    Access:  Right IJ vein.  Findings: Catheter tip positioned at SVC/RA junction. Port is ready for immediate use.   Complications: None  EBL: < 10 mL  Recommendations:  - Ok to shower in 24 hours - Do not submerge for 7 days - Routine line care   Chrishawna Farina T. Lynnzie Blackson, M.D Pager:  [phone]   

## 2017-06-07 NOTE — Progress Notes (Signed)
Referring Physician(s): Sindy Guadeloupe  Supervising Physician: Aletta Edouard  Patient Status:  [facility] outpatient  Chief Complaint: Esophageal melanoma  Subjective: Patient returns to [facility] department today for Port-A-Cath placement.  He was here yesterday for RP lymph node biopsy.  He has been NPO.  He does not take blood thinners.   Allergies: Morphine and related  Medications: Prior to Admission medications   Medication Sig Start Date End Date Taking? Authorizing Provider  acetaminophen (TYLENOL) 650 MG CR tablet Take 650 mg by mouth every 8 (eight) hours as needed for pain.   Yes [provider]  docusate sodium (COLACE) 100 MG capsule Take 100 mg by mouth daily.   Yes [provider]  metoprolol tartrate (LOPRESSOR) 25 MG tablet Take 25 mg by mouth daily.   Yes [provider]  quinapril (ACCUPRIL) 40 MG tablet Take 40 mg by mouth 1 day or 1 dose.   Yes [provider]  tamsulosin (FLOMAX) 0.4 MG CAPS capsule Take 0.4 mg by mouth daily.   Yes [provider]  traMADol (ULTRAM-ER) 100 MG 24 hr tablet Take 50 mg by mouth 2 (two) times daily. With lunch and dinner    Yes [provider]  traMADol (ULTRAM-ER) 300 MG 24 hr tablet Take 300 mg by mouth daily. AM dose   Yes [provider]  ascorbic acid (VITAMIN C) 1000 MG tablet Take 1,000 mg by mouth daily.    [provider]  aspirin 81 MG tablet Take 81 mg by mouth daily.    [provider]  atorvastatin (LIPITOR) 10 MG tablet Take 10 mg by mouth daily.    [provider]  BACILLUS COAGULANS-INULIN PO Take by mouth.    [provider]  cholecalciferol (VITAMIN D) 1000 units tablet Take 1,000 Units by mouth daily.    [provider]  co-enzyme Q-10 30 MG capsule Take 30 mg by mouth daily.    [provider]  dexamethasone (DECADRON) 4 MG tablet Take 2 tablets (8 mg total) by mouth daily. Start the day after  chemotherapy for 2 days. 05/31/17   Sindy Guadeloupe, MD  HYDROcodone-acetaminophen (NORCO) 5-325 MG tablet Take 1 tablet by mouth every 6 (six) hours as needed for moderate pain. Patient not taking: Reported on 05/23/2017 08/07/15   Daymon Larsen, MD  levothyroxine (SYNTHROID, LEVOTHROID) 100 MCG tablet Take 25 mcg by mouth daily before breakfast.    [provider]  levothyroxine (SYNTHROID, LEVOTHROID) 25 MCG tablet Take 25 mcg by mouth daily before breakfast.    [provider]  lidocaine-prilocaine (EMLA) cream Apply to affected area once 05/31/17   Sindy Guadeloupe, MD  LORazepam (ATIVAN) 0.5 MG tablet Take 1 tablet (0.5 mg total) by mouth every 6 (six) hours as needed (Nausea or vomiting). 05/31/17   Sindy Guadeloupe, MD  Multiple Vitamin (MULTIVITAMIN) tablet Take 1 tablet by mouth daily.    [provider]  Omega-3 1000 MG CAPS Take 340-1,000 capsules by mouth 2 (two) times daily.    [provider]  omeprazole (PRILOSEC) 20 MG capsule Take 20 mg by mouth daily.    [provider]  ondansetron (ZOFRAN) 8 MG tablet Take 1 tablet (8 mg total) by mouth 2 (two) times daily as needed for refractory nausea / vomiting. Start on day 3 after chemo. 05/31/17   Sindy Guadeloupe, MD  pravastatin (PRAVACHOL) 20 MG tablet Take 20 mg by mouth daily.    [provider]  predniSONE (DELTASONE) 5 MG tablet Take 5 mg by mouth daily with breakfast.    [provider]  Probiotic Product (PROBIOTIC COMPLEX ACIDOPHILUS PO) Take by mouth.    [provider]  prochlorperazine (COMPAZINE) 10 MG tablet Take 1 tablet (10 mg total) by mouth every 6 (six) hours as needed (Nausea or vomiting). 05/31/17   Sindy Guadeloupe, MD  vitamin B-12 (CYANOCOBALAMIN) 1000 MCG tablet Take 1,500 mcg by mouth daily.     [provider]     Vital Signs: BP (!) 177/82   Pulse 71   Temp 98.9 F (37.2 C) (Oral)   Resp 13   SpO2 98%   Physical Exam  Constitutional: He  is oriented to person, place, and time. He appears well-developed.  Cardiovascular: Normal rate, regular rhythm and normal heart sounds.  Pulmonary/Chest: Effort normal and breath sounds normal. No respiratory distress.  Abdominal: Soft.  Neurological: He is alert and oriented to person, place, and time.  Skin: Skin is warm and dry.  Psychiatric: He has a normal mood and affect. His behavior is normal. Judgment and thought content normal.  Nursing note and vitals reviewed.   Imaging: Ct Biopsy  Result Date: 06/06/2017 INDICATION: Esophageal malignancy, PET positive para-aortic adenopathy EXAM: CT-GUIDED BIOPSY LEFT RETROPERITONEAL PARA-AORTIC ADENOPATHY MEDICATIONS: 1% lidocaine local ANESTHESIA/SEDATION: 2.0 mg IV Versed; 75 mcg IV Fentanyl Moderate Sedation Time:  15 minutes The patient was continuously monitored during the procedure by the interventional radiology nurse under my direct supervision. PROCEDURE: The procedure, risks, benefits, and alternatives were explained to the patient. Questions regarding the procedure were encouraged and answered. The patient understands and consents to the procedure. Previous imaging reviewed. Patient positioned prone. Noncontrast localization CT performed. The left periaortic adenopathy was localized. Overlying skin marked for posterior approach. Under sterile conditions and local anesthesia, a 17 gauge 11.8 cm access needle was advanced from a posterior approach to the left periaortic adenopathy. Needle position confirmed with CT. Several 18 gauge cores obtained. Samples placed on a saline moist Telfa. Needle removed. Postprocedure imaging demonstrates no hemorrhage or hematoma. Patient tolerated the procedure well without complication. Vital sign monitoring by nursing staff during the procedure will continue as patient is in the special procedures unit for post procedure observation. FINDINGS: The images document guide needle placement within the left  para-aortic adenopathy. Post biopsy images demonstrate no hemorrhage or hematoma. COMPLICATIONS: None immediate. IMPRESSION: Successful CT-guided core biopsy of the left para-aortic adenopathy. Electronically Signed   By: Jerilynn Mages.  Shick M.D.   On: 06/06/2017 10:02    Labs:  CBC: Recent Labs    06/06/17 0738  WBC 6.9  HGB 11.6*  HCT 34.3*  PLT 250    COAGS: Recent Labs    06/06/17 0738  INR 0.93  APTT 33    BMP: No results for input(s): NA, K, CL, CO2, GLUCOSE, BUN, CALCIUM, CREATININE, GFRNONAA, GFRAA in the last 8760 hours.  Invalid input(s): CMP  LIVER FUNCTION TESTS: No results for input(s): BILITOT, AST, ALT, ALKPHOS, PROT, ALBUMIN in the last 8760 hours.  Assessment and Plan: Patient with past medical history of esophageal melanoma and left praraaortic adenopathy s/p biopsy yesterday presents for Port-A-Cath placement. .  Patient presents today in their usual state of health.  He has been NPO and is not currently on blood thinners.  Risks and benefits discussed with the patient including, but not limited to bleeding, infection, pneumothorax, or fibrin sheath development and need for additional procedures.  All of  the patient's questions were answered, patient is agreeable to proceed. Consent signed and in chart.  Electronically Signed: Docia Barrier, PA 06/07/2017, 8:22 AM   I spent a total of 15 Minutes at the the patient's bedside AND on the patient's hospital floor or unit, greater than 50% of which was counseling/coordinating care for esophageal melanoma.

## 2017-06-10 ENCOUNTER — Encounter: Payer: Self-pay | Admitting: Oncology

## 2017-06-10 ENCOUNTER — Telehealth: Payer: Self-pay

## 2017-06-10 LAB — SURGICAL PATHOLOGY

## 2017-06-10 NOTE — Telephone Encounter (Signed)
Called and notified Eddie Castaneda of chemotherapy appointment for 2/21. Reviewed in great detail. He is aware on 2/21 that he will go to the lab at 0800, then to radiation, then come upstairs to see Dr. Janese Banks, then he will proceed to infusion. Reviewed all premedications with him including Emla application. He can remove the dressing from port placement today. He has my contact number for any questions that may arise. Oncology Nurse Navigator Documentation  Navigator Location: [facility] (06/10/17 1200)   )Navigator Encounter Type: Telephone (06/10/17 1200) Telephone: Lahoma Crocker Call;Appt Confirmation/Clarification (06/10/17 1200)                                                  Time Spent with Patient: 30 (06/10/17 1200)

## 2017-06-11 ENCOUNTER — Ambulatory Visit
Admission: RE | Admit: 2017-06-11 | Discharge: 2017-06-11 | Disposition: A | Discharge status: Home / Self Care | Payer: Medicare Other | Source: Ambulatory Visit | Attending: Radiation Oncology | Admitting: Radiation Oncology

## 2017-06-12 ENCOUNTER — Other Ambulatory Visit: Payer: Self-pay | Admitting: *Deleted

## 2017-06-12 ENCOUNTER — Ambulatory Visit
Admission: RE | Admit: 2017-06-12 | Discharge: 2017-06-12 | Disposition: A | Discharge status: Home / Self Care | Payer: Medicare Other | Source: Ambulatory Visit | Attending: Radiation Oncology | Admitting: Radiation Oncology

## 2017-06-12 DIAGNOSIS — Z510 Encounter for antineoplastic radiation therapy: Secondary | ICD-10-CM | POA: Diagnosis not present

## 2017-06-12 DIAGNOSIS — C159 Malignant neoplasm of esophagus, unspecified: Secondary | ICD-10-CM

## 2017-06-13 ENCOUNTER — Inpatient Hospital Stay: Payer: Medicare Other

## 2017-06-13 ENCOUNTER — Inpatient Hospital Stay (HOSPITAL_BASED_OUTPATIENT_CLINIC_OR_DEPARTMENT_OTHER): Payer: Medicare Other | Admitting: Oncology

## 2017-06-13 ENCOUNTER — Ambulatory Visit
Admission: RE | Admit: 2017-06-13 | Discharge: 2017-06-13 | Disposition: A | Discharge status: Home / Self Care | Payer: Medicare Other | Source: Ambulatory Visit | Attending: Radiation Oncology | Admitting: Radiation Oncology

## 2017-06-13 ENCOUNTER — Encounter: Payer: Self-pay | Admitting: Oncology

## 2017-06-13 VITALS — BP 171/88 | HR 62

## 2017-06-13 VITALS — BP 158/77 | HR 79 | Temp 97.2°F | Resp 15 | Wt 184.0 lb

## 2017-06-13 DIAGNOSIS — Z510 Encounter for antineoplastic radiation therapy: Secondary | ICD-10-CM | POA: Diagnosis not present

## 2017-06-13 DIAGNOSIS — C770 Secondary and unspecified malignant neoplasm of lymph nodes of head, face and neck: Secondary | ICD-10-CM | POA: Diagnosis not present

## 2017-06-13 DIAGNOSIS — Z7189 Other specified counseling: Secondary | ICD-10-CM

## 2017-06-13 DIAGNOSIS — C155 Malignant neoplasm of lower third of esophagus: Secondary | ICD-10-CM

## 2017-06-13 DIAGNOSIS — D649 Anemia, unspecified: Secondary | ICD-10-CM | POA: Diagnosis not present

## 2017-06-13 DIAGNOSIS — C159 Malignant neoplasm of esophagus, unspecified: Secondary | ICD-10-CM

## 2017-06-13 DIAGNOSIS — Z5111 Encounter for antineoplastic chemotherapy: Secondary | ICD-10-CM

## 2017-06-13 LAB — CBC WITH DIFFERENTIAL/PLATELET
Basophils Absolute: 0 10*3/uL (ref 0–0.1)
Basophils Relative: 0 %
EOS ABS: 0.1 10*3/uL (ref 0–0.7)
EOS PCT: 1 %
HCT: 32.5 % — ABNORMAL LOW (ref 40.0–52.0)
HEMOGLOBIN: 10.9 g/dL — AB (ref 13.0–18.0)
Lymphocytes Relative: 25 %
Lymphs Abs: 2.1 10*3/uL (ref 1.0–3.6)
MCH: 33.5 pg (ref 26.0–34.0)
MCHC: 33.6 g/dL (ref 32.0–36.0)
MCV: 99.7 fL (ref 80.0–100.0)
Monocytes Absolute: 0.7 10*3/uL (ref 0.2–1.0)
Monocytes Relative: 8 %
NEUTROS PCT: 66 %
Neutro Abs: 5.4 10*3/uL (ref 1.4–6.5)
PLATELETS: 290 10*3/uL (ref 150–440)
RBC: 3.26 MIL/uL — AB (ref 4.40–5.90)
RDW: 13.2 % (ref 11.5–14.5)
WBC: 8.3 10*3/uL (ref 3.8–10.6)

## 2017-06-13 LAB — COMPREHENSIVE METABOLIC PANEL
ALK PHOS: 43 U/L (ref 38–126)
ALT: 19 U/L (ref 17–63)
AST: 32 U/L (ref 15–41)
Albumin: 3.8 g/dL (ref 3.5–5.0)
Anion gap: 8 (ref 5–15)
BILIRUBIN TOTAL: 0.4 mg/dL (ref 0.3–1.2)
BUN: 23 mg/dL — ABNORMAL HIGH (ref 6–20)
CALCIUM: 9 mg/dL (ref 8.9–10.3)
CO2: 26 mmol/L (ref 22–32)
CREATININE: 1.19 mg/dL (ref 0.61–1.24)
Chloride: 104 mmol/L (ref 101–111)
GFR, EST NON AFRICAN AMERICAN: 55 mL/min — AB (ref >60)
Glucose, Bld: 156 mg/dL — ABNORMAL HIGH (ref 65–99)
Potassium: 3.6 mmol/L (ref 3.5–5.1)
Sodium: 138 mmol/L (ref 135–145)
Total Protein: 7.3 g/dL (ref 6.5–8.1)

## 2017-06-13 MED ORDER — SODIUM CHLORIDE 0.9 % IV SOLN
Freq: Once | INTRAVENOUS | Status: AC
  Administered 2017-06-13: 10:00:00 via INTRAVENOUS
  Filled 2017-06-13: qty 1000

## 2017-06-13 MED ORDER — PALONOSETRON HCL INJECTION 0.25 MG/5ML
0.2500 mg | Freq: Once | INTRAVENOUS | Status: AC
  Administered 2017-06-13: 0.25 mg via INTRAVENOUS
  Filled 2017-06-13: qty 5

## 2017-06-13 MED ORDER — HEPARIN SOD (PORK) LOCK FLUSH 100 UNIT/ML IV SOLN
500.0000 [IU] | Freq: Once | INTRAVENOUS | Status: AC | PRN
  Administered 2017-06-13: 500 [IU]
  Filled 2017-06-13: qty 5

## 2017-06-13 MED ORDER — SODIUM CHLORIDE 0.9 % IV SOLN
20.0000 mg | Freq: Once | INTRAVENOUS | Status: AC
  Administered 2017-06-13: 20 mg via INTRAVENOUS
  Filled 2017-06-13: qty 2

## 2017-06-13 MED ORDER — FAMOTIDINE IN NACL 20-0.9 MG/50ML-% IV SOLN: 20.0000 mg | Freq: Once | INTRAVENOUS | Status: DC

## 2017-06-13 MED ORDER — DIPHENHYDRAMINE HCL 50 MG/ML IJ SOLN
50.0000 mg | Freq: Once | INTRAMUSCULAR | Status: AC
  Administered 2017-06-13: 50 mg via INTRAVENOUS
  Filled 2017-06-13: qty 1

## 2017-06-13 MED ORDER — SODIUM CHLORIDE 0.9 % IV SOLN
Freq: Once | INTRAVENOUS | Status: AC
  Administered 2017-06-13: 11:00:00 via INTRAVENOUS
  Filled 2017-06-13: qty 100

## 2017-06-13 MED ORDER — SODIUM CHLORIDE 0.9 % IV SOLN
165.0000 mg | Freq: Once | INTRAVENOUS | Status: AC
  Administered 2017-06-13: 170 mg via INTRAVENOUS
  Filled 2017-06-13: qty 17

## 2017-06-13 MED ORDER — SODIUM CHLORIDE 0.9 % IV SOLN
50.0000 mg/m2 | Freq: Once | INTRAVENOUS | Status: AC
  Administered 2017-06-13: 102 mg via INTRAVENOUS
  Filled 2017-06-13: qty 17

## 2017-06-13 NOTE — Progress Notes (Signed)
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  He also has a history of multiple back surgeries and chronic back pain but is otherwise healthy and lives with his wife.  He is independent of his ADLs.  He was seen by [facility] clinic GI in August 2018 for symptoms of epigastric pain and some difficulty swallowing.  He denies any change in his appetite or unintentional weight loss.  He reports dysphagia to both solids and liquids.    Patient had a barium swallow in September 2018 which showed moderate sized reducible hiatal hernia without evidence of stricture or reflux or esophagitis.  More proximally no stricture or spasm was observed.  No laryngeal penetration of the barium  He continued to have symptoms and then underwent an upper endoscopy in January 2019 which showed a medium-sized fungating mass with no bleeding found in the distal esophagus 35-37 cm from the incisors.  Mass was nonobstructing, partially obstructing and not circumferential.  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  Angiolymphatic invasion was present.  Fundic gland polyp was noted in the stomach which was negative for H. pylori dysplasia and malignancy.  Patient underwent PET/CT scan on 05/28/2017 which showed small hypermetabolic mass in the  distal esophagus with an SUV of 10.7.  Hypermetabolic left periaortic adenopathy compatible with malignancy.  Larger of the 2 involved lymph nodes with a maximum SUV of 16.1.  No other regions of tumor are identified.  Tumor marker testing shows he is positive for HER-2 and PDL 1 expressed  Biopsy of the left para-aortic lymph node positive for metastatic esophageal cancer  Interval history- he is doing well. He has stable chronic back pain for which he takes tylenol and tramadol. Reports no difficulty swallowing. He is also on chrinic prednisone for his arthritis  ECOG PS- 0 Pain scale- 0   Review of systems- Review of Systems  Constitutional: Negative for chills, fever, malaise/fatigue and weight loss.  HENT: Negative for congestion, ear discharge and nosebleeds.   Eyes: Negative for blurred vision.  Respiratory: Negative for cough, hemoptysis, sputum production, shortness of breath and wheezing.   Cardiovascular: Negative for chest pain, palpitations, orthopnea and claudication.  Gastrointestinal: Negative for abdominal pain, blood in stool, constipation, diarrhea, heartburn, melena, nausea and vomiting.  Genitourinary: Negative for dysuria, flank pain, frequency, hematuria and urgency.  Musculoskeletal: Positive for back pain. Negative for joint pain and myalgias.  Skin: Negative for rash.  Neurological: Negative for dizziness, tingling, focal weakness, seizures, weakness and headaches.  Endo/Heme/Allergies: Does not bruise/bleed easily.  Psychiatric/Behavioral: Negative for depression and suicidal ideas. The patient does not have insomnia.        Allergies  Allergen Reactions  . Morphine And Related Nausea Only     Past Medical History:  Diagnosis Date  . Anemia   . Arthritis   . Coronary artery disease   . Diverticulitis   .  Dysrhythmia   . GERD (gastroesophageal reflux disease)   . Heart murmur   . History of hiatal hernia   . Hyperglycemia   . Hyperlipidemia   .  Hypertension   . Hypothyroidism   . Spinal stenosis      Past Surgical History:  Procedure Laterality Date  . BACK SURGERY    . CATARACT EXTRACTION W/PHACO Left 11/08/2014   Procedure: CATARACT EXTRACTION PHACO AND INTRAOCULAR LENS PLACEMENT (IOC);  Surgeon: Estill Cotta, MD;  Location: [facility] ORS;  Service: Ophthalmology;  Laterality: Left;  US:01:20 AP:21.8 CDE:31.09 PAC LOT: [id number]  . CHOLECYSTECTOMY    . ESOPHAGOGASTRODUODENOSCOPY (EGD) WITH PROPOFOL N/A 05/23/2017   Procedure: ESOPHAGOGASTRODUODENOSCOPY (EGD) WITH PROPOFOL;  Surgeon: Lollie Sails, MD;  Location: [facility] ENDOSCOPY;  Service: Endoscopy;  Laterality: N/A;  . EYE SURGERY    . IR FLUORO GUIDE PORT INSERTION RIGHT  06/07/2017  . JOINT REPLACEMENT Right    Knee  . ROTATOR CUFF REPAIR Right     Social History   Socioeconomic History  . Marital status: Married    Spouse name: Not on file  . Number of children: Not on file  . Years of education: Not on file  . Highest education level: Not on file  Social Needs  . Financial resource strain: Not on file  . Food insecurity - worry: Not on file  . Food insecurity - inability: Not on file  . Transportation needs - medical: Not on file  . Transportation needs - non-medical: Not on file  Occupational History  . Not on file  Tobacco Use  . Smoking status: Former Smoker    Last attempt to quit: 03/24/1989    Years since quitting: 28.2  . Smokeless tobacco: Never Used  Substance and Sexual Activity  . Alcohol use: No    Frequency: Never  . Drug use: No  . Sexual activity: Not on file  Other Topics Concern  . Not on file  Social History Narrative  . Not on file    Family History  Problem Relation Age of Onset  . Colon cancer Mother      Current Outpatient Medications:  .  acetaminophen (TYLENOL) 650 MG CR tablet, Take 650 mg by mouth every 8 (eight) hours as needed for pain., Disp: , Rfl:  .  ascorbic acid (VITAMIN C) 1000 MG tablet, Take 1,000 mg  by mouth daily., Disp: , Rfl:  .  aspirin 81 MG tablet, Take 81 mg by mouth daily., Disp: , Rfl:  .  atorvastatin (LIPITOR) 10 MG tablet, Take 10 mg by mouth daily., Disp: , Rfl:  .  BACILLUS COAGULANS-INULIN PO, Take by mouth., Disp: , Rfl:  .  cholecalciferol (VITAMIN D) 1000 units tablet, Take 1,000 Units by mouth daily., Disp: , Rfl:  .  co-enzyme Q-10 30 MG capsule, Take 30 mg by mouth daily., Disp: , Rfl:  .  dexamethasone (DECADRON) 4 MG tablet, Take 2 tablets (8 mg total) by mouth daily. Start the day after chemotherapy for 2 days., Disp: 30 tablet, Rfl: 1 .  docusate sodium (COLACE) 100 MG capsule, Take 100 mg by mouth daily., Disp: , Rfl:  .  HYDROcodone-acetaminophen (NORCO) 5-325 MG tablet, Take 1 tablet by mouth every 6 (six) hours as needed for moderate pain. (Patient not taking: Reported on 05/23/2017), Disp: 20 tablet, Rfl: 0 .  levothyroxine (SYNTHROID, LEVOTHROID) 100 MCG tablet, Take 25 mcg by mouth daily before breakfast., Disp: , Rfl:  .  levothyroxine (SYNTHROID, LEVOTHROID) 25 MCG  tablet, Take 25 mcg by mouth daily before breakfast., Disp: , Rfl:  .  lidocaine-prilocaine (EMLA) cream, Apply to affected area once, Disp: 30 g, Rfl: 3 .  LORazepam (ATIVAN) 0.5 MG tablet, Take 1 tablet (0.5 mg total) by mouth every 6 (six) hours as needed (Nausea or vomiting)., Disp: 30 tablet, Rfl: 0 .  metoprolol tartrate (LOPRESSOR) 25 MG tablet, Take 25 mg by mouth daily., Disp: , Rfl:  .  Multiple Vitamin (MULTIVITAMIN) tablet, Take 1 tablet by mouth daily., Disp: , Rfl:  .  Omega-3 1000 MG CAPS, Take 340-1,000 capsules by mouth 2 (two) times daily., Disp: , Rfl:  .  omeprazole (PRILOSEC) 20 MG capsule, Take 20 mg by mouth daily., Disp: , Rfl:  .  ondansetron (ZOFRAN) 8 MG tablet, Take 1 tablet (8 mg total) by mouth 2 (two) times daily as needed for refractory nausea / vomiting. Start on day 3 after chemo., Disp: 30 tablet, Rfl: 1 .  pravastatin (PRAVACHOL) 20 MG tablet, Take 20 mg by mouth  daily., Disp: , Rfl:  .  predniSONE (DELTASONE) 5 MG tablet, Take 5 mg by mouth daily with breakfast., Disp: , Rfl:  .  Probiotic Product (PROBIOTIC COMPLEX ACIDOPHILUS PO), Take by mouth., Disp: , Rfl:  .  prochlorperazine (COMPAZINE) 10 MG tablet, Take 1 tablet (10 mg total) by mouth every 6 (six) hours as needed (Nausea or vomiting)., Disp: 30 tablet, Rfl: 1 .  quinapril (ACCUPRIL) 40 MG tablet, Take 40 mg by mouth 1 day or 1 dose., Disp: , Rfl:  .  tamsulosin (FLOMAX) 0.4 MG CAPS capsule, Take 0.4 mg by mouth daily., Disp: , Rfl:  .  traMADol (ULTRAM-ER) 100 MG 24 hr tablet, Take 50 mg by mouth 2 (two) times daily. With lunch and dinner , Disp: , Rfl:  .  traMADol (ULTRAM-ER) 300 MG 24 hr tablet, Take 300 mg by mouth daily. AM dose, Disp: , Rfl:  .  vitamin B-12 (CYANOCOBALAMIN) 1000 MCG tablet, Take 1,500 mcg by mouth daily. , Disp: , Rfl:   Physical exam:  Vitals:   06/13/17 0915 06/13/17 0926  BP: (!) 158/77   Pulse: 79   Resp: 15   Temp:  (!) 97.2 F (36.2 C)  TempSrc: Tympanic Tympanic  Weight: 184 lb (83.5 kg)    Physical Exam  Constitutional: He is oriented to person, place, and time and well-developed, well-nourished, and in no distress.  HENT:  Head: Normocephalic and atraumatic.  Eyes: EOM are normal. Pupils are equal, round, and reactive to light.  Neck: Normal range of motion.  Cardiovascular: Normal rate, regular rhythm and normal heart sounds.  Pulmonary/Chest: Effort normal and breath sounds normal.  Abdominal: Soft. Bowel sounds are normal.  Neurological: He is alert and oriented to person, place, and time.  Skin: Skin is warm and dry.     CMP Latest Ref Rng & Units 06/13/2017  Glucose 65 - 99 mg/dL 156(H)  BUN 6 - 20 mg/dL 23(H)  Creatinine 0.61 - 1.24 mg/dL 1.19  Sodium 135 - 145 mmol/L 138  Potassium 3.5 - 5.1 mmol/L 3.6  Chloride 101 - 111 mmol/L 104  CO2 22 - 32 mmol/L 26  Calcium 8.9 - 10.3 mg/dL 9.0  Total Protein 6.5 - 8.1 g/dL 7.3  Total  Bilirubin 0.3 - 1.2 mg/dL 0.4  Alkaline Phos 38 - 126 U/L 43  AST 15 - 41 U/L 32  ALT 17 - 63 U/L 19   CBC Latest Ref Rng & Units 06/13/2017  WBC 3.8 - 10.6 K/uL 8.3  Hemoglobin 13.0 - 18.0 g/dL 10.9(L)  Hematocrit 40.0 - 52.0 % 32.5(L)  Platelets 150 - 440 K/uL 290    No images are attached to the encounter.  Nm Pet Image Initial (pi) Skull Base To Thigh  Result Date: 05/28/2017 CLINICAL DATA:  Initial treatment strategy for esophageal melanoma. EXAM: NUCLEAR MEDICINE PET SKULL BASE TO THIGH TECHNIQUE: 13.8 mCi F-18 FDG was injected intravenously. Full-ring PET imaging was performed from the skull base to thigh after the radiotracer. CT data was obtained and used for attenuation correction and anatomic localization. FASTING BLOOD GLUCOSE:  Value: 87 mg/dl COMPARISON:  Multiple exams, including CT scans from 08/07/2015, 05/09/2012, and 08/27/2007 FINDINGS: NECK No hypermetabolic lymph nodes in the neck. CHEST Hypermetabolic distal esophageal mass has a maximum standard uptake value of 10.7. There is an adjacent 5 mm in short axis paraesophageal lymph node on image 121/3 which is not perceptibly hypermetabolic. Calcified mitral and aortic valves. Coronary, aortic arch, and branch vessel atherosclerotic vascular disease. ABDOMEN/PELVIS Small focus of questionably accentuated metabolic activity in the gastric cardia, maximum SUV 6.1. Left para-aortic lymph node measures 1.5 cm in short axis 16.1. A smaller left periaortic lymph node slightly further cephalad on image 169/3 measures 0.8 cm in short axis with maximum SUV 10.5. Cholecystectomy noted. Aortoiliac atherosclerotic vascular disease. Posterior bladder diverticulum noted. Right scrotal hydrocele. SKELETON No focal hypermetabolic activity to suggest skeletal metastasis. IMPRESSION: 1. Small hypermetabolic mass in the distal esophagus, maximum SUV 10.7. 2. Hypermetabolic left periaortic adenopathy compatible with malignancy. The larger of the 2  involved lymph nodes has a maximum SUV of 16.1. 3. Subtle focus of accentuated metabolic activity along the gastric cardia, quite possibly physiologic. 4. No other regions of tumor are identified. Please note that today's exam was performed as a skull base through proximal femur examination, and did not include the distal extremities or the top of the head. 5. Aortic Atherosclerosis (ICD10-I70.0). Coronary atherosclerosis with calcified mitral and aortic valves. Electronically Signed   By: Van Clines M.D.   On: 05/28/2017 11:29   Ct Biopsy  Result Date: 06/06/2017 INDICATION: Esophageal malignancy, PET positive para-aortic adenopathy EXAM: CT-GUIDED BIOPSY LEFT RETROPERITONEAL PARA-AORTIC ADENOPATHY MEDICATIONS: 1% lidocaine local ANESTHESIA/SEDATION: 2.0 mg IV Versed; 75 mcg IV Fentanyl Moderate Sedation Time:  15 minutes The patient was continuously monitored during the procedure by the interventional radiology nurse under my direct supervision. PROCEDURE: The procedure, risks, benefits, and alternatives were explained to the patient. Questions regarding the procedure were encouraged and answered. The patient understands and consents to the procedure. Previous imaging reviewed. Patient positioned prone. Noncontrast localization CT performed. The left periaortic adenopathy was localized. Overlying skin marked for posterior approach. Under sterile conditions and local anesthesia, a 17 gauge 11.8 cm access needle was advanced from a posterior approach to the left periaortic adenopathy. Needle position confirmed with CT. Several 18 gauge cores obtained. Samples placed on a saline moist Telfa. Needle removed. Postprocedure imaging demonstrates no hemorrhage or hematoma. Patient tolerated the procedure well without complication. Vital sign monitoring by nursing staff during the procedure will continue as patient is in the special procedures unit for post procedure observation. FINDINGS: The images document  guide needle placement within the left para-aortic adenopathy. Post biopsy images demonstrate no hemorrhage or hematoma. COMPLICATIONS: None immediate. IMPRESSION: Successful CT-guided core biopsy of the left para-aortic adenopathy. Electronically Signed   By: Jerilynn Mages.  Shick M.D.   On: 06/06/2017 10:02   [place]  Insertion Right  Result Date: 06/07/2017 CLINICAL DATA:  Distal esophageal adenocarcinoma metastatic to retroperitoneal lymph nodes. Port placement requested prior to beginning chemotherapy. EXAM: IMPLANTED PORT A CATH PLACEMENT WITH ULTRASOUND AND FLUOROSCOPIC GUIDANCE ANESTHESIA/SEDATION: 3.0 mg IV Versed; 100 mcg IV Fentanyl Total Moderate Sedation Time:  30 minutes The patient's level of consciousness and physiologic status were continuously monitored during the procedure by Radiology nursing. Additional Medications: 2 g IV Ancef. FLUOROSCOPY TIME:  12 seconds.  3.0 mGy. PROCEDURE: The procedure, risks, benefits, and alternatives were explained to the patient. Questions regarding the procedure were encouraged and answered. The patient understands and consents to the procedure. A time-out was performed prior to initiating the procedure. Ultrasound was utilized to confirm patency of the right internal jugular vein. The right neck and chest were prepped with chlorhexidine in a sterile fashion, and a sterile drape was applied covering the operative field. Maximum barrier sterile technique with sterile gowns and gloves were used for the procedure. Local anesthesia was provided with 1% lidocaine. After creating a small venotomy incision, a 21 gauge needle was advanced into the right internal jugular vein under direct, real-time ultrasound guidance. Ultrasound image documentation was performed. After securing guidewire access, an 8 Fr dilator was placed. A J-wire was kinked to measure appropriate catheter length. A subcutaneous port pocket was then created along the upper chest wall utilizing sharp  and blunt dissection. Portable cautery was utilized. The pocket was irrigated with sterile saline. A single lumen power injectable port was chosen for placement. The 8 Fr catheter was tunneled from the port pocket site to the venotomy incision. The port was placed in the pocket. External catheter was trimmed to appropriate length based on guidewire measurement. At the venotomy, an 8 Fr peel-away sheath was placed over a guidewire. The catheter was then placed through the sheath and the sheath removed. Final catheter positioning was confirmed and documented with a fluoroscopic spot image. The port was accessed with a needle and aspirated and flushed with heparinized saline. The access needle was removed. The venotomy and port pocket incisions were closed with subcutaneous 3-0 Monocryl and subcuticular 4-0 Vicryl. Dermabond was applied to both incisions. COMPLICATIONS: COMPLICATIONS None FINDINGS: After catheter placement, the tip lies at the cavo-atrial junction. The catheter aspirates normally and is ready for immediate use. IMPRESSION: Placement of single lumen port a cath via right internal jugular vein. The catheter tip lies at the cavo-atrial junction. A power injectable port a cath was placed and is ready for immediate use. Electronically Signed   By: Aletta Edouard M.D.   On: 06/07/2017 09:39     Assessment and plan- Patient is a 84 y.o. male with stage IV esophageal adenocarcinoma c TX N0M1 with metastases to the para-aortic lymph node   I again discussed the findings of the PET/CT scan as well as the biopsy of the lymph node with the patient in detail.  Patient did not have any evidence of regional lymph node involvement on PET/CT scan but was found to have hypermetabolic left para-aortic adenopathy which is biopsy-proven esophageal cancer making this M1 disease or stage IV.  Given that he does not have any evidence of distant metastatic disease his case was discussed at the tumor board and plan is to  proceed with, weekly carbotaxol along with radiation with the palliative intent followed by SBR T to the periaortic lymph nodes.  Given that he has evidence of stage IV disease to his lymph nodes I will hold off on getting  an EUS for T and N staging at this time  Patient is 84 years of age and given evidence of M1 disease to his lymph nodes I did not think that he is a surgical candidate either.  At this time I will proceed with carbotaxol radiation followed by SBR T to his periaortic lymph nodes.  If he has good response to treatment I will continue surveillance scans about every 3 months and plan on giving him systemic HER-2 based chemotherapy upon progression.  Patient understands that his current treatment is given with the palliative intent  Again discussed risks and benefits of chemotherapy including all but not limited to nausea, vomiting, fatigue, low blood counts, risk of infections and hospitalization.  Risk of infusion reaction associated with Taxol and peripheral neuropathy as well.  Patient understands and agrees to proceed.  He already has a port in place and started radiation on 06/11/2017.  Patient does have some baseline normocytic anemia and his hemoglobin is 10.9 today.  Check ferritin and iron studies and B12 folate with next cycle  I will see him back in 1 week's time with CBC and CMP prior to cycle #2 of chemotherapy   Cancer Staging Malignant neoplasm of esophagus [facility]) Staging form: Esophagus - Adenocarcinoma, AJCC 8th Edition - Clinical stage from 05/30/2017: Stage IVB (cTX, cNX, cM1, G3) - Signed by Sindy Guadeloupe, MD on 05/31/2017   Visit Diagnosis 1. Malignant neoplasm of esophagus, unspecified location ([place])   2. Goals of care, counseling/discussion   3. Encounter for antineoplastic chemotherapy      Dr. Randa Evens, MD, MPH [facility] at [facility] Pager- [phone] 06/13/2017 9:54 AM

## 2017-06-13 NOTE — Progress Notes (Signed)
Nutrition Assessment   Reason for Assessment:   New esophageal cancer  ASSESSMENT:  84 year old male with stage IV esophageal cancer with mets to para-aortic lymph node.  Past medical history of GERD, multiple back surgeries, CAD, HLD, HTN, hyperglycemia.  Planning chemotherapy of carbo/taxol weekly and radiation therapy.  Noted not a surgical candidate.  Met with patient and wife during infusion today. Patient reports that appetite has been good but amount of food that he is able to eat is smaller than it use to be.  Reports that he can eat all consistency of foods.  Sometimes if eats too much will have pain.  Reports typical day is eggs and toast with butter and jelly or cereal and toast or oatmeal and toast around 10am.  Sometimes skips lunch or drinks boost shake around 1pm or has soup.  Supper meal is usually around 6-7pm and has meat and vegetables.  Reports that he is drinking boost high protein shake at times but not daily.    Nutrition Focused Physical Exam: deferred  Medications: reviewed  Labs: glucose 156, BUN 23  Anthropometrics:   Height: 71 inches Weight: 184 lb today UBW: 180-185 over the past  3 months but typically in the 190s prior to that BMI: 25  3% weight loss in the last 3 months, not significant   Estimated Energy Needs  Kcals: 2100-2500 calories/d Protein: 101-126 g/d Fluid: 2.5 L/d  NUTRITION DIAGNOSIS: Inadequate oral intake related to esophageal mass as evidenced by 3% weight loss and patient reporting eating less than normal.   MALNUTRITION DIAGNOSIS: none at this time   INTERVENTION:   Discussed importance of nutrition during treatment. Discussed strategies to increase calories and protein to prevent further weight loss.  Soft Moist Protein fact sheet given today.  Encouraged patient to drink 1-2 high calorie, high protein shake daily for additional calories and protein. Samples and coupons given. Discussed foods to consider if having nausea  and vomiting (first chemotherapy treatment given today).  Fact sheet given.    MONITORING, EVALUATION, GOAL: Patient will consume adequate calories and protein to meet nutritional goals   NEXT VISIT: March 7 during infusion  Demitrios Molyneux B. Zenia Resides, [place], [place] Registered Dietitian [phone] (pager)

## 2017-06-14 ENCOUNTER — Ambulatory Visit
Admission: RE | Admit: 2017-06-14 | Discharge: 2017-06-14 | Disposition: A | Discharge status: Home / Self Care | Payer: Medicare Other | Source: Ambulatory Visit | Attending: Radiation Oncology | Admitting: Radiation Oncology

## 2017-06-14 DIAGNOSIS — Z510 Encounter for antineoplastic radiation therapy: Secondary | ICD-10-CM | POA: Diagnosis not present

## 2017-06-17 ENCOUNTER — Ambulatory Visit
Admission: RE | Admit: 2017-06-17 | Discharge: 2017-06-17 | Disposition: A | Discharge status: Home / Self Care | Payer: Medicare Other | Source: Ambulatory Visit | Attending: Radiation Oncology | Admitting: Radiation Oncology

## 2017-06-17 DIAGNOSIS — Z510 Encounter for antineoplastic radiation therapy: Secondary | ICD-10-CM | POA: Diagnosis not present

## 2017-06-18 ENCOUNTER — Other Ambulatory Visit: Payer: Self-pay | Admitting: *Deleted

## 2017-06-18 ENCOUNTER — Ambulatory Visit
Admission: RE | Admit: 2017-06-18 | Discharge: 2017-06-18 | Disposition: A | Discharge status: Home / Self Care | Payer: Medicare Other | Source: Ambulatory Visit | Attending: Radiation Oncology | Admitting: Radiation Oncology

## 2017-06-18 DIAGNOSIS — Z510 Encounter for antineoplastic radiation therapy: Secondary | ICD-10-CM | POA: Diagnosis not present

## 2017-06-18 MED ORDER — SUCRALFATE 1 G PO TABS: 1.0000 g | ORAL_TABLET | Freq: Three times a day (TID) | ORAL | 3 refills | Status: DC

## 2017-06-19 ENCOUNTER — Ambulatory Visit
Admission: RE | Admit: 2017-06-19 | Discharge: 2017-06-19 | Disposition: A | Discharge status: Home / Self Care | Payer: Medicare Other | Source: Ambulatory Visit | Attending: Radiation Oncology | Admitting: Radiation Oncology

## 2017-06-19 DIAGNOSIS — Z510 Encounter for antineoplastic radiation therapy: Secondary | ICD-10-CM | POA: Diagnosis not present

## 2017-06-20 ENCOUNTER — Ambulatory Visit
Admission: RE | Admit: 2017-06-20 | Discharge: 2017-06-20 | Disposition: A | Discharge status: Home / Self Care | Payer: Medicare Other | Source: Ambulatory Visit | Attending: Radiation Oncology | Admitting: Radiation Oncology

## 2017-06-20 ENCOUNTER — Inpatient Hospital Stay (HOSPITAL_BASED_OUTPATIENT_CLINIC_OR_DEPARTMENT_OTHER): Payer: Medicare Other | Admitting: Oncology

## 2017-06-20 ENCOUNTER — Inpatient Hospital Stay: Payer: Medicare Other

## 2017-06-20 ENCOUNTER — Encounter: Payer: Self-pay | Admitting: Oncology

## 2017-06-20 VITALS — BP 145/84 | HR 80 | Temp 96.8°F | Resp 16 | Wt 181.0 lb

## 2017-06-20 DIAGNOSIS — C159 Malignant neoplasm of esophagus, unspecified: Secondary | ICD-10-CM

## 2017-06-20 DIAGNOSIS — C155 Malignant neoplasm of lower third of esophagus: Secondary | ICD-10-CM

## 2017-06-20 DIAGNOSIS — D649 Anemia, unspecified: Secondary | ICD-10-CM

## 2017-06-20 DIAGNOSIS — Z5111 Encounter for antineoplastic chemotherapy: Secondary | ICD-10-CM

## 2017-06-20 DIAGNOSIS — C770 Secondary and unspecified malignant neoplasm of lymph nodes of head, face and neck: Secondary | ICD-10-CM

## 2017-06-20 DIAGNOSIS — Z510 Encounter for antineoplastic radiation therapy: Secondary | ICD-10-CM | POA: Diagnosis not present

## 2017-06-20 LAB — COMPREHENSIVE METABOLIC PANEL
ALBUMIN: 3.9 g/dL (ref 3.5–5.0)
ALK PHOS: 42 U/L (ref 38–126)
ALT: 35 U/L (ref 17–63)
ANION GAP: 8 (ref 5–15)
AST: 32 U/L (ref 15–41)
BILIRUBIN TOTAL: 0.4 mg/dL (ref 0.3–1.2)
BUN: 30 mg/dL — AB (ref 6–20)
CALCIUM: 9.3 mg/dL (ref 8.9–10.3)
CO2: 25 mmol/L (ref 22–32)
Chloride: 102 mmol/L (ref 101–111)
Creatinine, Ser: 1.17 mg/dL (ref 0.61–1.24)
GFR calc Af Amer: >60 mL/min (ref >60)
GFR calc non Af Amer: 57 mL/min — ABNORMAL LOW (ref >60)
GLUCOSE: 114 mg/dL — AB (ref 65–99)
Potassium: 4 mmol/L (ref 3.5–5.1)
Sodium: 135 mmol/L (ref 135–145)
TOTAL PROTEIN: 7.2 g/dL (ref 6.5–8.1)

## 2017-06-20 LAB — CBC WITH DIFFERENTIAL/PLATELET
BASOS ABS: 0 10*3/uL (ref 0–0.1)
BASOS PCT: 0 %
EOS PCT: 1 %
Eosinophils Absolute: 0.1 10*3/uL (ref 0–0.7)
HCT: 32.7 % — ABNORMAL LOW (ref 40.0–52.0)
Hemoglobin: 11.2 g/dL — ABNORMAL LOW (ref 13.0–18.0)
Lymphocytes Relative: 28 %
Lymphs Abs: 1.3 10*3/uL (ref 1.0–3.6)
MCH: 33.8 pg (ref 26.0–34.0)
MCHC: 34.3 g/dL (ref 32.0–36.0)
MCV: 98.6 fL (ref 80.0–100.0)
MONO ABS: 0.3 10*3/uL (ref 0.2–1.0)
Monocytes Relative: 7 %
NEUTROS ABS: 3 10*3/uL (ref 1.4–6.5)
Neutrophils Relative %: 64 %
PLATELETS: 278 10*3/uL (ref 150–440)
RBC: 3.32 MIL/uL — ABNORMAL LOW (ref 4.40–5.90)
RDW: 12.7 % (ref 11.5–14.5)
WBC: 4.7 10*3/uL (ref 3.8–10.6)

## 2017-06-20 LAB — VITAMIN B12: Vitamin B-12: 802 pg/mL (ref 180–914)

## 2017-06-20 LAB — FOLATE: Folate: 56 ng/mL (ref >5.9)

## 2017-06-20 LAB — IRON AND TIBC
IRON: 60 ug/dL (ref 45–182)
Saturation Ratios: 17 % — ABNORMAL LOW (ref 17.9–39.5)
TIBC: 362 ug/dL (ref 250–450)
UIBC: 302 ug/dL

## 2017-06-20 LAB — FERRITIN: Ferritin: 210 ng/mL (ref 24–336)

## 2017-06-20 MED ORDER — HEPARIN SOD (PORK) LOCK FLUSH 100 UNIT/ML IV SOLN
500.0000 [IU] | Freq: Once | INTRAVENOUS | Status: AC | PRN
  Administered 2017-06-20: 500 [IU]
  Filled 2017-06-20: qty 5

## 2017-06-20 MED ORDER — SODIUM CHLORIDE 0.9 % IV SOLN
170.0000 mg | Freq: Once | INTRAVENOUS | Status: AC
  Administered 2017-06-20: 170 mg via INTRAVENOUS
  Filled 2017-06-20: qty 17

## 2017-06-20 MED ORDER — DEXAMETHASONE SODIUM PHOSPHATE 100 MG/10ML IJ SOLN
20.0000 mg | Freq: Once | INTRAMUSCULAR | Status: AC
  Administered 2017-06-20: 20 mg via INTRAVENOUS
  Filled 2017-06-20: qty 2

## 2017-06-20 MED ORDER — SODIUM CHLORIDE 0.9 % IV SOLN
Freq: Once | INTRAVENOUS | Status: AC
  Administered 2017-06-20: 11:00:00 via INTRAVENOUS
  Filled 2017-06-20: qty 100

## 2017-06-20 MED ORDER — SODIUM CHLORIDE 0.9 % IV SOLN
Freq: Once | INTRAVENOUS | Status: AC
  Administered 2017-06-20: 10:00:00 via INTRAVENOUS
  Filled 2017-06-20: qty 1000

## 2017-06-20 MED ORDER — FAMOTIDINE IN NACL 20-0.9 MG/50ML-% IV SOLN: 20.0000 mg | Freq: Once | INTRAVENOUS | Status: DC

## 2017-06-20 MED ORDER — DIPHENHYDRAMINE HCL 50 MG/ML IJ SOLN
50.0000 mg | Freq: Once | INTRAMUSCULAR | Status: AC
  Administered 2017-06-20: 50 mg via INTRAVENOUS
  Filled 2017-06-20: qty 1

## 2017-06-20 MED ORDER — PALONOSETRON HCL INJECTION 0.25 MG/5ML
0.2500 mg | Freq: Once | INTRAVENOUS | Status: AC
  Administered 2017-06-20: 0.25 mg via INTRAVENOUS
  Filled 2017-06-20: qty 5

## 2017-06-20 MED ORDER — PACLITAXEL CHEMO INJECTION 300 MG/50ML
50.0000 mg/m2 | Freq: Once | INTRAVENOUS | Status: AC
  Administered 2017-06-20: 102 mg via INTRAVENOUS
  Filled 2017-06-20: qty 17

## 2017-06-20 NOTE — Progress Notes (Signed)
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 He also has a history of multiple back surgeries and chronic back pain but is otherwise healthy and lives with his wife. He is independent of his ADLs. He was seen by [facility] GI in August 2018 for symptoms of epigastric pain and some difficulty swallowing. He denies any change in his appetite or unintentional weight loss. He reports dysphagia to both solids and liquids.   Patient had a barium swallow in September 2018 which showed moderate sized reducible hiatal hernia without evidence of stricture or reflux or esophagitis. More proximally no stricture or spasm was observed. No laryngeal penetration of the barium  He continued to have symptoms and then underwent an upper endoscopy in January 2019 which showed a medium-sized fungating mass with no bleeding found in the distal esophagus 35-37 cm from the incisors. Mass was nonobstructing, partially obstructing and not circumferential. 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 Angiolymphatic invasion was present. Fundic gland polyp was noted in the stomach which was negative for H. pylori dysplasia and malignancy.  Patient underwent PET/CT scan on 05/28/2017 which showed small hypermetabolic mass in the  distal esophagus with an SUV of 10.7. Hypermetabolic left periaortic adenopathy compatible with malignancy. Larger of the 2 involved lymph nodes with a maximum SUV of 16.1. No other regions of tumor are identified.  Tumor marker testing shows he is positive for HER-2 and PDL 1 expressed  Biopsy of the left para-aortic lymph node positive for metastatic esophageal cancer     Interval history- tolerated cycle 1 of carbo/ taxol well without significant nausea/ vomiting. He does report some pain on swallowing and was prescribed arafate by Dr. Donella Stade which he is yet to start taking. Otherwise doing well and denies other complaints  ECOG PS- 0 Pain scale- 0   Review of systems- Review of Systems  Constitutional: Negative for chills, fever, malaise/fatigue and weight loss.  HENT: Negative for congestion, ear discharge and nosebleeds.   Eyes: Negative for blurred vision.  Respiratory: Negative for cough, hemoptysis, sputum production, shortness of breath and wheezing.   Cardiovascular: Negative for chest pain, palpitations, orthopnea and claudication.  Gastrointestinal: Negative for abdominal pain, blood in stool, constipation, diarrhea, heartburn, melena, nausea and vomiting.       Pain on swallowing  Genitourinary: Negative for dysuria, flank pain, frequency, hematuria and urgency.  Musculoskeletal: Negative for back pain, joint pain and myalgias.  Skin: Negative for rash.  Neurological: Negative for dizziness, tingling, focal weakness, seizures, weakness and headaches.  Endo/Heme/Allergies: Does not bruise/bleed easily.  Psychiatric/Behavioral: Negative for depression and suicidal ideas. The patient does not have insomnia.       Allergies  Allergen Reactions  . Morphine And Related Nausea Only     Past Medical History:  Diagnosis Date  . Anemia   . Arthritis   . Coronary artery disease   .  Diverticulitis   . Dysrhythmia   . GERD (gastroesophageal reflux disease)   .  Heart murmur   . History of hiatal hernia   . Hyperglycemia   . Hyperlipidemia   . Hypertension   . Hypothyroidism   . Spinal stenosis      Past Surgical History:  Procedure Laterality Date  . BACK SURGERY    . CATARACT EXTRACTION W/PHACO Left 11/08/2014   Procedure: CATARACT EXTRACTION PHACO AND INTRAOCULAR LENS PLACEMENT (IOC);  Surgeon: Estill Cotta, MD;  Location: [facility] ORS;  Service: Ophthalmology;  Laterality: Left;  US:01:20 AP:21.8 CDE:31.09 PAC LOT: [id number]  . CHOLECYSTECTOMY    . ESOPHAGOGASTRODUODENOSCOPY (EGD) WITH PROPOFOL N/A 05/23/2017   Procedure: ESOPHAGOGASTRODUODENOSCOPY (EGD) WITH PROPOFOL;  Surgeon: Lollie Sails, MD;  Location: [facility] ENDOSCOPY;  Service: Endoscopy;  Laterality: N/A;  . EYE SURGERY    . IR FLUORO GUIDE PORT INSERTION RIGHT  06/07/2017  . JOINT REPLACEMENT Right    Knee  . ROTATOR CUFF REPAIR Right     Social History   Socioeconomic History  . Marital status: Married    Spouse name: Not on file  . Number of children: Not on file  . Years of education: Not on file  . Highest education level: Not on file  Social Needs  . Financial resource strain: Not on file  . Food insecurity - worry: Not on file  . Food insecurity - inability: Not on file  . Transportation needs - medical: Not on file  . Transportation needs - non-medical: Not on file  Occupational History  . Not on file  Tobacco Use  . Smoking status: Former Smoker    Last attempt to quit: 03/24/1989    Years since quitting: 28.2  . Smokeless tobacco: Never Used  Substance and Sexual Activity  . Alcohol use: No    Frequency: Never  . Drug use: No  . Sexual activity: Not on file  Other Topics Concern  . Not on file  Social History Narrative  . Not on file    Family History  Problem Relation Age of Onset  . Colon cancer Mother      Current Outpatient Medications:  .  acetaminophen (TYLENOL) 650 MG CR tablet, Take 650 mg by mouth every 8 (eight) hours as  needed for pain., Disp: , Rfl:  .  ascorbic acid (VITAMIN C) 1000 MG tablet, Take 1,000 mg by mouth daily., Disp: , Rfl:  .  aspirin 81 MG tablet, Take 81 mg by mouth daily., Disp: , Rfl:  .  atorvastatin (LIPITOR) 10 MG tablet, Take 10 mg by mouth daily., Disp: , Rfl:  .  BACILLUS COAGULANS-INULIN PO, Take by mouth., Disp: , Rfl:  .  cholecalciferol (VITAMIN D) 1000 units tablet, Take 1,000 Units by mouth daily., Disp: , Rfl:  .  co-enzyme Q-10 30 MG capsule, Take 30 mg by mouth daily., Disp: , Rfl:  .  dexamethasone (DECADRON) 4 MG tablet, Take 2 tablets (8 mg total) by mouth daily. Start the day after chemotherapy for 2 days., Disp: 30 tablet, Rfl: 1 .  docusate sodium (COLACE) 100 MG capsule, Take 100 mg by mouth daily., Disp: , Rfl:  .  HYDROcodone-acetaminophen (NORCO) 5-325 MG tablet, Take 1 tablet by mouth every 6 (six) hours as needed for moderate pain., Disp: 20 tablet, Rfl: 0 .  levothyroxine (SYNTHROID, LEVOTHROID) 100 MCG tablet, Take 25 mcg by mouth daily before breakfast., Disp: , Rfl:  .  levothyroxine (SYNTHROID, LEVOTHROID) 25 MCG tablet, Take  25 mcg by mouth daily before breakfast., Disp: , Rfl:  .  lidocaine-prilocaine (EMLA) cream, Apply to affected area once, Disp: 30 g, Rfl: 3 .  LORazepam (ATIVAN) 0.5 MG tablet, Take 1 tablet (0.5 mg total) by mouth every 6 (six) hours as needed (Nausea or vomiting)., Disp: 30 tablet, Rfl: 0 .  metoprolol tartrate (LOPRESSOR) 25 MG tablet, Take 25 mg by mouth daily., Disp: , Rfl:  .  Multiple Vitamin (MULTIVITAMIN) tablet, Take 1 tablet by mouth daily., Disp: , Rfl:  .  Omega-3 1000 MG CAPS, Take 340-1,000 capsules by mouth 2 (two) times daily., Disp: , Rfl:  .  omeprazole (PRILOSEC) 20 MG capsule, Take 20 mg by mouth daily., Disp: , Rfl:  .  ondansetron (ZOFRAN) 8 MG tablet, Take 1 tablet (8 mg total) by mouth 2 (two) times daily as needed for refractory nausea / vomiting. Start on day 3 after chemo., Disp: 30 tablet, Rfl: 1 .  pravastatin  (PRAVACHOL) 20 MG tablet, Take 20 mg by mouth daily., Disp: , Rfl:  .  predniSONE (DELTASONE) 5 MG tablet, Take 5 mg by mouth daily with breakfast., Disp: , Rfl:  .  Probiotic Product (PROBIOTIC COMPLEX ACIDOPHILUS PO), Take by mouth., Disp: , Rfl:  .  prochlorperazine (COMPAZINE) 10 MG tablet, Take 1 tablet (10 mg total) by mouth every 6 (six) hours as needed (Nausea or vomiting)., Disp: 30 tablet, Rfl: 1 .  quinapril (ACCUPRIL) 40 MG tablet, Take 40 mg by mouth 1 day or 1 dose., Disp: , Rfl:  .  sucralfate (CARAFATE) 1 g tablet, Take 1 tablet (1 g total) by mouth 3 (three) times daily. Dissolve in 2-3 tbsp warm water, swish and swallow., Disp: 90 tablet, Rfl: 3 .  tamsulosin (FLOMAX) 0.4 MG CAPS capsule, Take 0.4 mg by mouth daily., Disp: , Rfl:  .  traMADol (ULTRAM-ER) 100 MG 24 hr tablet, Take 50 mg by mouth 2 (two) times daily. With lunch and dinner , Disp: , Rfl:  .  traMADol (ULTRAM-ER) 300 MG 24 hr tablet, Take 300 mg by mouth daily. AM dose, Disp: , Rfl:  .  vitamin B-12 (CYANOCOBALAMIN) 1000 MCG tablet, Take 1,500 mcg by mouth daily. , Disp: , Rfl:   Physical exam:  Vitals:   06/20/17 0930 06/20/17 0934  BP:  (!) 145/84  Pulse: 82 80  Resp: 16   Temp: (!) 96.8 F (36 C)   TempSrc: Tympanic   Weight: 181 lb (82.1 kg)    Physical Exam  Constitutional: He is oriented to person, place, and time and well-developed, well-nourished, and in no distress.  HENT:  Head: Normocephalic and atraumatic.  Mouth/Throat: Oropharynx is clear and moist.  Eyes: EOM are normal. Pupils are equal, round, and reactive to light.  Neck: Normal range of motion.  Cardiovascular: Normal rate and regular rhythm.  Murmur (systolic) heard. Pulmonary/Chest: Effort normal and breath sounds normal.  Abdominal: Soft. Bowel sounds are normal.  Neurological: He is alert and oriented to person, place, and time.  Skin: Skin is warm and dry.     CMP Latest Ref Rng & Units 06/20/2017  Glucose 65 - 99 mg/dL  114(H)  BUN 6 - 20 mg/dL 30(H)  Creatinine 0.61 - 1.24 mg/dL 1.17  Sodium 135 - 145 mmol/L 135  Potassium 3.5 - 5.1 mmol/L 4.0  Chloride 101 - 111 mmol/L 102  CO2 22 - 32 mmol/L 25  Calcium 8.9 - 10.3 mg/dL 9.3  Total Protein 6.5 - 8.1 g/dL 7.2  Total Bilirubin 0.3 - 1.2 mg/dL 0.4  Alkaline Phos 38 - 126 U/L 42  AST 15 - 41 U/L 32  ALT 17 - 63 U/L 35   CBC Latest Ref Rng & Units 06/20/2017  WBC 3.8 - 10.6 K/uL 4.7  Hemoglobin 13.0 - 18.0 g/dL 11.2(L)  Hematocrit 40.0 - 52.0 % 32.7(L)  Platelets 150 - 440 K/uL 278    No images are attached to the encounter.  Nm Pet Image Initial (pi) Skull Base To Thigh  Result Date: 05/28/2017 CLINICAL DATA:  Initial treatment strategy for esophageal melanoma. EXAM: NUCLEAR MEDICINE PET SKULL BASE TO THIGH TECHNIQUE: 13.8 mCi F-18 FDG was injected intravenously. Full-ring PET imaging was performed from the skull base to thigh after the radiotracer. CT data was obtained and used for attenuation correction and anatomic localization. FASTING BLOOD GLUCOSE:  Value: 87 mg/dl COMPARISON:  Multiple exams, including CT scans from 08/07/2015, 05/09/2012, and 08/27/2007 FINDINGS: NECK No hypermetabolic lymph nodes in the neck. CHEST Hypermetabolic distal esophageal mass has a maximum standard uptake value of 10.7. There is an adjacent 5 mm in short axis paraesophageal lymph node on image 121/3 which is not perceptibly hypermetabolic. Calcified mitral and aortic valves. Coronary, aortic arch, and branch vessel atherosclerotic vascular disease. ABDOMEN/PELVIS Small focus of questionably accentuated metabolic activity in the gastric cardia, maximum SUV 6.1. Left para-aortic lymph node measures 1.5 cm in short axis 16.1. A smaller left periaortic lymph node slightly further cephalad on image 169/3 measures 0.8 cm in short axis with maximum SUV 10.5. Cholecystectomy noted. Aortoiliac atherosclerotic vascular disease. Posterior bladder diverticulum noted. Right scrotal  hydrocele. SKELETON No focal hypermetabolic activity to suggest skeletal metastasis. IMPRESSION: 1. Small hypermetabolic mass in the distal esophagus, maximum SUV 10.7. 2. Hypermetabolic left periaortic adenopathy compatible with malignancy. The larger of the 2 involved lymph nodes has a maximum SUV of 16.1. 3. Subtle focus of accentuated metabolic activity along the gastric cardia, quite possibly physiologic. 4. No other regions of tumor are identified. Please note that today's exam was performed as a skull base through proximal femur examination, and did not include the distal extremities or the top of the head. 5. Aortic Atherosclerosis (ICD10-I70.0). Coronary atherosclerosis with calcified mitral and aortic valves. Electronically Signed   By: Van Clines M.D.   On: 05/28/2017 11:29   Ct Biopsy  Result Date: 06/06/2017 INDICATION: Esophageal malignancy, PET positive para-aortic adenopathy EXAM: CT-GUIDED BIOPSY LEFT RETROPERITONEAL PARA-AORTIC ADENOPATHY MEDICATIONS: 1% lidocaine local ANESTHESIA/SEDATION: 2.0 mg IV Versed; 75 mcg IV Fentanyl Moderate Sedation Time:  15 minutes The patient was continuously monitored during the procedure by the interventional radiology nurse under my direct supervision. PROCEDURE: The procedure, risks, benefits, and alternatives were explained to the patient. Questions regarding the procedure were encouraged and answered. The patient understands and consents to the procedure. Previous imaging reviewed. Patient positioned prone. Noncontrast localization CT performed. The left periaortic adenopathy was localized. Overlying skin marked for posterior approach. Under sterile conditions and local anesthesia, a 17 gauge 11.8 cm access needle was advanced from a posterior approach to the left periaortic adenopathy. Needle position confirmed with CT. Several 18 gauge cores obtained. Samples placed on a saline moist Telfa. Needle removed. Postprocedure imaging demonstrates no  hemorrhage or hematoma. Patient tolerated the procedure well without complication. Vital sign monitoring by nursing staff during the procedure will continue as patient is in the special procedures unit for post procedure observation. FINDINGS: The images document guide needle placement within the left para-aortic adenopathy. Post biopsy  images demonstrate no hemorrhage or hematoma. COMPLICATIONS: None immediate. IMPRESSION: Successful CT-guided core biopsy of the left para-aortic adenopathy. Electronically Signed   By: Jerilynn Mages.  Shick M.D.   On: 06/06/2017 10:02   Ir Fluoro Guide Port Insertion Right  Result Date: 06/07/2017 CLINICAL DATA:  Distal esophageal adenocarcinoma metastatic to retroperitoneal lymph nodes. Port placement requested prior to beginning chemotherapy. EXAM: IMPLANTED PORT A CATH PLACEMENT WITH ULTRASOUND AND FLUOROSCOPIC GUIDANCE ANESTHESIA/SEDATION: 3.0 mg IV Versed; 100 mcg IV Fentanyl Total Moderate Sedation Time:  30 minutes The patient's level of consciousness and physiologic status were continuously monitored during the procedure by Radiology nursing. Additional Medications: 2 g IV Ancef. FLUOROSCOPY TIME:  12 seconds.  3.0 mGy. PROCEDURE: The procedure, risks, benefits, and alternatives were explained to the patient. Questions regarding the procedure were encouraged and answered. The patient understands and consents to the procedure. A time-out was performed prior to initiating the procedure. Ultrasound was utilized to confirm patency of the right internal jugular vein. The right neck and chest were prepped with chlorhexidine in a sterile fashion, and a sterile drape was applied covering the operative field. Maximum barrier sterile technique with sterile gowns and gloves were used for the procedure. Local anesthesia was provided with 1% lidocaine. After creating a small venotomy incision, a 21 gauge needle was advanced into the right internal jugular vein under direct, real-time ultrasound  guidance. Ultrasound image documentation was performed. After securing guidewire access, an 8 Fr dilator was placed. A J-wire was kinked to measure appropriate catheter length. A subcutaneous port pocket was then created along the upper chest wall utilizing sharp and blunt dissection. Portable cautery was utilized. The pocket was irrigated with sterile saline. A single lumen power injectable port was chosen for placement. The 8 Fr catheter was tunneled from the port pocket site to the venotomy incision. The port was placed in the pocket. External catheter was trimmed to appropriate length based on guidewire measurement. At the venotomy, an 8 Fr peel-away sheath was placed over a guidewire. The catheter was then placed through the sheath and the sheath removed. Final catheter positioning was confirmed and documented with a fluoroscopic spot image. The port was accessed with a needle and aspirated and flushed with heparinized saline. The access needle was removed. The venotomy and port pocket incisions were closed with subcutaneous 3-0 Monocryl and subcuticular 4-0 Vicryl. Dermabond was applied to both incisions. COMPLICATIONS: COMPLICATIONS None FINDINGS: After catheter placement, the tip lies at the cavo-atrial junction. The catheter aspirates normally and is ready for immediate use. IMPRESSION: Placement of single lumen port a cath via right internal jugular vein. The catheter tip lies at the cavo-atrial junction. A power injectable port a cath was placed and is ready for immediate use. Electronically Signed   By: Aletta Edouard M.D.   On: 06/07/2017 09:39     Assessment and plan- Patient is a 84 y.o. male with stage IV esophageal adenocarcinoma c TX N0M1 with metastases to the para-aortic lymph node here for on treatment assessment prior to cycle #2 of weekly carbo/Taxol along with radiation  Counts ok to proceed with cycle 2 of weekly carbo/taxol with RT today. He will proceed to cycle 3 of chemotherapy  next week and I will see him back in 2 weeks for cycle 4 of carbo/taxol. Cbc, cmp each week. He will call in the interim if there are any questions or concerns    Visit Diagnosis 1. Malignant neoplasm of esophagus, unspecified location ([place])   2. Encounter  for antineoplastic chemotherapy      Dr. Randa Evens, MD, MPH [facility] Pager- [phone] 06/20/2017 9:59 AM

## 2017-06-21 ENCOUNTER — Ambulatory Visit
Admission: RE | Admit: 2017-06-21 | Discharge: 2017-06-21 | Disposition: A | Discharge status: Home / Self Care | Payer: Medicare Other | Source: Ambulatory Visit | Attending: Radiation Oncology | Admitting: Radiation Oncology

## 2017-06-21 DIAGNOSIS — C159 Malignant neoplasm of esophagus, unspecified: Secondary | ICD-10-CM | POA: Insufficient documentation

## 2017-06-21 DIAGNOSIS — Z510 Encounter for antineoplastic radiation therapy: Secondary | ICD-10-CM | POA: Diagnosis not present

## 2017-06-21 DIAGNOSIS — C772 Secondary and unspecified malignant neoplasm of intra-abdominal lymph nodes: Secondary | ICD-10-CM | POA: Insufficient documentation

## 2017-06-24 ENCOUNTER — Ambulatory Visit
Admission: RE | Admit: 2017-06-24 | Discharge: 2017-06-24 | Disposition: A | Discharge status: Home / Self Care | Payer: Medicare Other | Source: Ambulatory Visit | Attending: Radiation Oncology | Admitting: Radiation Oncology

## 2017-06-24 DIAGNOSIS — Z510 Encounter for antineoplastic radiation therapy: Secondary | ICD-10-CM | POA: Diagnosis not present

## 2017-06-25 ENCOUNTER — Ambulatory Visit
Admission: RE | Admit: 2017-06-25 | Discharge: 2017-06-25 | Disposition: A | Discharge status: Home / Self Care | Payer: Medicare Other | Source: Ambulatory Visit | Attending: Radiation Oncology | Admitting: Radiation Oncology

## 2017-06-25 DIAGNOSIS — Z510 Encounter for antineoplastic radiation therapy: Secondary | ICD-10-CM | POA: Diagnosis not present

## 2017-06-26 ENCOUNTER — Ambulatory Visit
Admission: RE | Admit: 2017-06-26 | Discharge: 2017-06-26 | Disposition: A | Discharge status: Home / Self Care | Payer: Medicare Other | Source: Ambulatory Visit | Attending: Radiation Oncology | Admitting: Radiation Oncology

## 2017-06-26 DIAGNOSIS — Z510 Encounter for antineoplastic radiation therapy: Secondary | ICD-10-CM | POA: Diagnosis not present

## 2017-06-27 ENCOUNTER — Ambulatory Visit
Admission: RE | Admit: 2017-06-27 | Discharge: 2017-06-27 | Disposition: A | Discharge status: Home / Self Care | Payer: Medicare Other | Source: Ambulatory Visit | Attending: Radiation Oncology | Admitting: Radiation Oncology

## 2017-06-27 ENCOUNTER — Inpatient Hospital Stay: Payer: Medicare Other

## 2017-06-27 ENCOUNTER — Inpatient Hospital Stay: Payer: Medicare Other | Attending: Oncology

## 2017-06-27 ENCOUNTER — Ambulatory Visit: Payer: Medicare Other | Admitting: Oncology

## 2017-06-27 VITALS — BP 141/87 | HR 98 | Temp 96.6°F | Resp 18 | Wt 180.1 lb

## 2017-06-27 DIAGNOSIS — D6959 Other secondary thrombocytopenia: Secondary | ICD-10-CM | POA: Diagnosis not present

## 2017-06-27 DIAGNOSIS — Z452 Encounter for adjustment and management of vascular access device: Secondary | ICD-10-CM | POA: Diagnosis not present

## 2017-06-27 DIAGNOSIS — Z5189 Encounter for other specified aftercare: Secondary | ICD-10-CM | POA: Insufficient documentation

## 2017-06-27 DIAGNOSIS — Z510 Encounter for antineoplastic radiation therapy: Secondary | ICD-10-CM | POA: Diagnosis not present

## 2017-06-27 DIAGNOSIS — Z5111 Encounter for antineoplastic chemotherapy: Secondary | ICD-10-CM | POA: Diagnosis present

## 2017-06-27 DIAGNOSIS — C159 Malignant neoplasm of esophagus, unspecified: Secondary | ICD-10-CM

## 2017-06-27 DIAGNOSIS — L259 Unspecified contact dermatitis, unspecified cause: Secondary | ICD-10-CM | POA: Insufficient documentation

## 2017-06-27 DIAGNOSIS — C155 Malignant neoplasm of lower third of esophagus: Secondary | ICD-10-CM | POA: Insufficient documentation

## 2017-06-27 LAB — COMPREHENSIVE METABOLIC PANEL
ALK PHOS: 42 U/L (ref 38–126)
ALT: 39 U/L (ref 17–63)
AST: 34 U/L (ref 15–41)
Albumin: 3.9 g/dL (ref 3.5–5.0)
Anion gap: 9 (ref 5–15)
BILIRUBIN TOTAL: 0.7 mg/dL (ref 0.3–1.2)
BUN: 34 mg/dL — AB (ref 6–20)
CALCIUM: 9.3 mg/dL (ref 8.9–10.3)
CHLORIDE: 103 mmol/L (ref 101–111)
CO2: 23 mmol/L (ref 22–32)
CREATININE: 1.17 mg/dL (ref 0.61–1.24)
GFR, EST NON AFRICAN AMERICAN: 57 mL/min — AB (ref >60)
Glucose, Bld: 123 mg/dL — ABNORMAL HIGH (ref 65–99)
Potassium: 3.9 mmol/L (ref 3.5–5.1)
Sodium: 135 mmol/L (ref 135–145)
Total Protein: 7.1 g/dL (ref 6.5–8.1)

## 2017-06-27 LAB — CBC WITH DIFFERENTIAL/PLATELET
BASOS PCT: 1 %
Basophils Absolute: 0 10*3/uL (ref 0–0.1)
EOS ABS: 0 10*3/uL (ref 0–0.7)
EOS PCT: 1 %
HCT: 32.5 % — ABNORMAL LOW (ref 40.0–52.0)
Hemoglobin: 11.2 g/dL — ABNORMAL LOW (ref 13.0–18.0)
Lymphocytes Relative: 26 %
Lymphs Abs: 1 10*3/uL (ref 1.0–3.6)
MCH: 34 pg (ref 26.0–34.0)
MCHC: 34.3 g/dL (ref 32.0–36.0)
MCV: 99.1 fL (ref 80.0–100.0)
Monocytes Absolute: 0.3 10*3/uL (ref 0.2–1.0)
Monocytes Relative: 8 %
Neutro Abs: 2.4 10*3/uL (ref 1.4–6.5)
Neutrophils Relative %: 64 %
PLATELETS: 199 10*3/uL (ref 150–440)
RBC: 3.28 MIL/uL — AB (ref 4.40–5.90)
RDW: 13 % (ref 11.5–14.5)
WBC: 3.7 10*3/uL — AB (ref 3.8–10.6)

## 2017-06-27 MED ORDER — SODIUM CHLORIDE 0.9 % IV SOLN
167.0000 mg | Freq: Once | INTRAVENOUS | Status: AC
  Administered 2017-06-27: 170 mg via INTRAVENOUS
  Filled 2017-06-27: qty 17

## 2017-06-27 MED ORDER — HEPARIN SOD (PORK) LOCK FLUSH 100 UNIT/ML IV SOLN
500.0000 [IU] | Freq: Once | INTRAVENOUS | Status: AC | PRN
  Administered 2017-06-27: 500 [IU]
  Filled 2017-06-27: qty 5

## 2017-06-27 MED ORDER — SODIUM CHLORIDE 0.9 % IV SOLN
50.0000 mg/m2 | Freq: Once | INTRAVENOUS | Status: AC
  Administered 2017-06-27: 102 mg via INTRAVENOUS
  Filled 2017-06-27: qty 17

## 2017-06-27 MED ORDER — SODIUM CHLORIDE 0.9 % IV SOLN
Freq: Once | INTRAVENOUS | Status: AC
  Administered 2017-06-27: 10:00:00 via INTRAVENOUS
  Filled 2017-06-27: qty 1000

## 2017-06-27 MED ORDER — DIPHENHYDRAMINE HCL 50 MG/ML IJ SOLN
50.0000 mg | Freq: Once | INTRAMUSCULAR | Status: AC
  Administered 2017-06-27: 50 mg via INTRAVENOUS
  Filled 2017-06-27: qty 1

## 2017-06-27 MED ORDER — FAMOTIDINE IN NACL 20-0.9 MG/50ML-% IV SOLN: 20.0000 mg | Freq: Once | INTRAVENOUS | Status: DC

## 2017-06-27 MED ORDER — PALONOSETRON HCL INJECTION 0.25 MG/5ML
0.2500 mg | Freq: Once | INTRAVENOUS | Status: AC
  Administered 2017-06-27: 0.25 mg via INTRAVENOUS
  Filled 2017-06-27: qty 5

## 2017-06-27 MED ORDER — SODIUM CHLORIDE 0.9 % IV SOLN
20.0000 mg | Freq: Once | INTRAVENOUS | Status: AC
  Administered 2017-06-27: 20 mg via INTRAVENOUS
  Filled 2017-06-27: qty 2

## 2017-06-27 MED ORDER — SODIUM CHLORIDE 0.9 % IV SOLN
20.0000 mg | Freq: Once | INTRAVENOUS | Status: AC
  Administered 2017-06-27: 20 mg via INTRAVENOUS
  Filled 2017-06-27: qty 100

## 2017-06-27 NOTE — Progress Notes (Signed)
Nutrition Follow-up:  Patient with esophageal cancer with mets to para-aortic lymph node.  Patient receiving chemotherapy and radiation therapy.  Noted patient is not a surgical candidate.  Patient followed by Dr. Janese Banks  Met with patient during infusion today.  Patient reports appetite has been good but takes him longer to eat.  Reports that he has been drinking 1 boost shake per day (not sure if it is plus variety).    No nutrition impact symptom reported today.   Reports that family from out of town is coming in to visit with him and stay through the weekend. Patient happy to see them and they will be joining him during infusion.     Medications: carafate  Labs: reviewed  Anthropometrics:   Weight decreased to 180 lb 2 oz today from 184 lb on 2/21   NUTRITION DIAGNOSIS: Inadequate oral intake continues   MALNUTRITION DIAGNOSIS: none at this time   INTERVENTION:   Encouraged patient to drink 2-3 plus shakes per day (high calorie, high protein) to prevent further weight loss. Patient denies needing coupons at this time.      MONITORING, EVALUATION, GOAL: Patient will consume adequate calories and protein to meet nutritional goals.   NEXT VISIT: March 21 during infusion  Mizraim Harmening B. Zenia Resides, [place], [place] Registered Dietitian [phone] (pager)

## 2017-06-28 ENCOUNTER — Ambulatory Visit
Admission: RE | Admit: 2017-06-28 | Discharge: 2017-06-28 | Disposition: A | Discharge status: Home / Self Care | Payer: Medicare Other | Source: Ambulatory Visit | Attending: Radiation Oncology | Admitting: Radiation Oncology

## 2017-06-28 ENCOUNTER — Other Ambulatory Visit: Payer: Self-pay | Admitting: *Deleted

## 2017-06-28 DIAGNOSIS — C159 Malignant neoplasm of esophagus, unspecified: Secondary | ICD-10-CM

## 2017-06-28 DIAGNOSIS — Z510 Encounter for antineoplastic radiation therapy: Secondary | ICD-10-CM | POA: Diagnosis not present

## 2017-07-01 ENCOUNTER — Ambulatory Visit
Admission: RE | Admit: 2017-07-01 | Discharge: 2017-07-01 | Disposition: A | Discharge status: Home / Self Care | Payer: Medicare Other | Source: Ambulatory Visit | Attending: Radiation Oncology | Admitting: Radiation Oncology

## 2017-07-01 DIAGNOSIS — Z510 Encounter for antineoplastic radiation therapy: Secondary | ICD-10-CM | POA: Diagnosis not present

## 2017-07-02 ENCOUNTER — Ambulatory Visit
Admission: RE | Admit: 2017-07-02 | Discharge: 2017-07-02 | Disposition: A | Discharge status: Home / Self Care | Payer: Medicare Other | Source: Ambulatory Visit | Attending: Radiation Oncology | Admitting: Radiation Oncology

## 2017-07-02 DIAGNOSIS — Z510 Encounter for antineoplastic radiation therapy: Secondary | ICD-10-CM | POA: Diagnosis not present

## 2017-07-03 ENCOUNTER — Ambulatory Visit
Admission: RE | Admit: 2017-07-03 | Discharge: 2017-07-03 | Disposition: A | Discharge status: Home / Self Care | Payer: Medicare Other | Source: Ambulatory Visit | Attending: Radiation Oncology | Admitting: Radiation Oncology

## 2017-07-03 DIAGNOSIS — Z510 Encounter for antineoplastic radiation therapy: Secondary | ICD-10-CM | POA: Diagnosis not present

## 2017-07-04 ENCOUNTER — Inpatient Hospital Stay: Payer: Medicare Other

## 2017-07-04 ENCOUNTER — Inpatient Hospital Stay (HOSPITAL_BASED_OUTPATIENT_CLINIC_OR_DEPARTMENT_OTHER): Payer: Medicare Other | Admitting: Oncology

## 2017-07-04 ENCOUNTER — Encounter: Payer: Self-pay | Admitting: Oncology

## 2017-07-04 ENCOUNTER — Ambulatory Visit
Admission: RE | Admit: 2017-07-04 | Discharge: 2017-07-04 | Disposition: A | Discharge status: Home / Self Care | Payer: Medicare Other | Source: Ambulatory Visit | Attending: Radiation Oncology | Admitting: Radiation Oncology

## 2017-07-04 VITALS — BP 123/80 | HR 55 | Temp 97.5°F | Wt 180.3 lb

## 2017-07-04 DIAGNOSIS — L259 Unspecified contact dermatitis, unspecified cause: Secondary | ICD-10-CM | POA: Diagnosis not present

## 2017-07-04 DIAGNOSIS — C159 Malignant neoplasm of esophagus, unspecified: Secondary | ICD-10-CM

## 2017-07-04 DIAGNOSIS — D6959 Other secondary thrombocytopenia: Secondary | ICD-10-CM

## 2017-07-04 DIAGNOSIS — Z510 Encounter for antineoplastic radiation therapy: Secondary | ICD-10-CM | POA: Diagnosis not present

## 2017-07-04 DIAGNOSIS — T451X5A Adverse effect of antineoplastic and immunosuppressive drugs, initial encounter: Secondary | ICD-10-CM

## 2017-07-04 DIAGNOSIS — C155 Malignant neoplasm of lower third of esophagus: Secondary | ICD-10-CM | POA: Diagnosis not present

## 2017-07-04 DIAGNOSIS — Z5111 Encounter for antineoplastic chemotherapy: Secondary | ICD-10-CM | POA: Diagnosis not present

## 2017-07-04 LAB — COMPREHENSIVE METABOLIC PANEL WITH GFR
ALT: 30 U/L (ref 17–63)
AST: 29 U/L (ref 15–41)
Albumin: 3.6 g/dL (ref 3.5–5.0)
Alkaline Phosphatase: 37 U/L — ABNORMAL LOW (ref 38–126)
Anion gap: 7 (ref 5–15)
BUN: 36 mg/dL — ABNORMAL HIGH (ref 6–20)
CO2: 25 mmol/L (ref 22–32)
Calcium: 9.1 mg/dL (ref 8.9–10.3)
Chloride: 105 mmol/L (ref 101–111)
Creatinine, Ser: 1.02 mg/dL (ref 0.61–1.24)
GFR calc Af Amer: >60 mL/min
GFR calc non Af Amer: >60 mL/min
Glucose, Bld: 139 mg/dL — ABNORMAL HIGH (ref 65–99)
Potassium: 3.9 mmol/L (ref 3.5–5.1)
Sodium: 137 mmol/L (ref 135–145)
Total Bilirubin: 0.4 mg/dL (ref 0.3–1.2)
Total Protein: 6.8 g/dL (ref 6.5–8.1)

## 2017-07-04 LAB — CBC WITH DIFFERENTIAL/PLATELET
Basophils Absolute: 0 K/uL (ref 0–0.1)
Basophils Relative: 1 %
Eosinophils Absolute: 0 K/uL (ref 0–0.7)
Eosinophils Relative: 1 %
HCT: 30.5 % — ABNORMAL LOW (ref 40.0–52.0)
Hemoglobin: 10.6 g/dL — ABNORMAL LOW (ref 13.0–18.0)
Lymphocytes Relative: 21 %
Lymphs Abs: 0.7 K/uL — ABNORMAL LOW (ref 1.0–3.6)
MCH: 34.1 pg — ABNORMAL HIGH (ref 26.0–34.0)
MCHC: 34.7 g/dL (ref 32.0–36.0)
MCV: 98.4 fL (ref 80.0–100.0)
Monocytes Absolute: 0.3 K/uL (ref 0.2–1.0)
Monocytes Relative: 9 %
Neutro Abs: 2.3 K/uL (ref 1.4–6.5)
Neutrophils Relative %: 68 %
Platelets: 139 K/uL — ABNORMAL LOW (ref 150–440)
RBC: 3.09 MIL/uL — ABNORMAL LOW (ref 4.40–5.90)
RDW: 12.9 % (ref 11.5–14.5)
WBC: 3.3 K/uL — ABNORMAL LOW (ref 3.8–10.6)

## 2017-07-04 MED ORDER — SODIUM CHLORIDE 0.9 % IV SOLN
45.0000 mg/m2 | Freq: Once | INTRAVENOUS | Status: AC
  Administered 2017-07-04: 90 mg via INTRAVENOUS
  Filled 2017-07-04: qty 15

## 2017-07-04 MED ORDER — SODIUM CHLORIDE 0.9 % IV SOLN
138.1500 mg | Freq: Once | INTRAVENOUS | Status: AC
  Administered 2017-07-04: 140 mg via INTRAVENOUS
  Filled 2017-07-04: qty 14

## 2017-07-04 MED ORDER — SODIUM CHLORIDE 0.9 % IV SOLN: 138.1500 mg | Freq: Once | INTRAVENOUS | Status: DC

## 2017-07-04 MED ORDER — DIPHENHYDRAMINE HCL 50 MG/ML IJ SOLN
50.0000 mg | Freq: Once | INTRAMUSCULAR | Status: AC
  Administered 2017-07-04: 50 mg via INTRAVENOUS
  Filled 2017-07-04: qty 1

## 2017-07-04 MED ORDER — PALONOSETRON HCL INJECTION 0.25 MG/5ML
0.2500 mg | Freq: Once | INTRAVENOUS | Status: AC
  Administered 2017-07-04: 0.25 mg via INTRAVENOUS
  Filled 2017-07-04: qty 5

## 2017-07-04 MED ORDER — SODIUM CHLORIDE 0.9 % IV SOLN
INTRAVENOUS | Status: DC
  Administered 2017-07-04: 10:00:00 via INTRAVENOUS
  Filled 2017-07-04 (×2): qty 100

## 2017-07-04 MED ORDER — SODIUM CHLORIDE 0.9% FLUSH
10.0000 mL | INTRAVENOUS | Status: DC | PRN
  Administered 2017-07-04: 10 mL via INTRAVENOUS
  Filled 2017-07-04: qty 10

## 2017-07-04 MED ORDER — HEPARIN SOD (PORK) LOCK FLUSH 100 UNIT/ML IV SOLN
500.0000 [IU] | Freq: Once | INTRAVENOUS | Status: AC
  Administered 2017-07-04: 500 [IU] via INTRAVENOUS
  Filled 2017-07-04: qty 5

## 2017-07-04 MED ORDER — SODIUM CHLORIDE 0.9 % IV SOLN
20.0000 mg | Freq: Once | INTRAVENOUS | Status: AC
  Administered 2017-07-04: 20 mg via INTRAVENOUS
  Filled 2017-07-04: qty 2

## 2017-07-04 MED ORDER — FAMOTIDINE IN NACL 20-0.9 MG/50ML-% IV SOLN
20.0000 mg | Freq: Once | INTRAVENOUS | Status: DC
  Filled 2017-07-04: qty 50

## 2017-07-04 MED ORDER — SODIUM CHLORIDE 0.9 % IV SOLN
Freq: Once | INTRAVENOUS | Status: AC
  Administered 2017-07-04: 09:00:00 via INTRAVENOUS
  Filled 2017-07-04: qty 1000

## 2017-07-04 NOTE — Progress Notes (Signed)
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 He also has a history of multiple back surgeries and chronic back pain but is otherwise healthy and lives with his wife. He is independent of his ADLs. He was [facility] in August 2018 for symptoms of epigastric pain and some difficulty swallowing. He denies any change in his appetite or unintentional weight loss. He reports dysphagia to both solids and liquids.   Patient had a barium swallow in September 2018 which showed moderate sized reducible hiatal hernia without evidence of stricture or reflux or esophagitis. More proximally no stricture or spasm was observed. No laryngeal penetration of the barium  He continued to have symptoms and then underwent an upper endoscopy in January 2019 which showed a medium-sized fungating mass with no bleeding found in the distal esophagus 35-37 cm from the incisors. Mass was nonobstructing, partially obstructing and not circumferential. 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 Angiolymphatic invasion was present. Fundic gland polyp was noted in the stomach which was negative for H. pylori dysplasia and malignancy.  Patient underwent PET/CT scan on 05/28/2017 which showed small hypermetabolic mass in the  distal esophagus with an SUV of 10.7. Hypermetabolic left periaortic adenopathy compatible with malignancy. Larger of the 2 involved lymph nodes with a maximum SUV of 16.1. No other regions of tumor are identified.  Tumor marker testing shows he is positive for HER-2 and PDL 1 expressed  Biopsy of the left para-aortic lymph node positive for metastatic esophageal cancer   Interval history- reports skin rash on the undersurface of right arm. It does not itch. Incidentally noted at the time of RT. No exposure to new soaps, lotions or poison ivy.appetite is good. He is able to swallow normally. Denies any fatigue  ECOG PS- 0 Pain scale- 0   Review of systems- Review of Systems  Constitutional: Negative for chills, fever, malaise/fatigue and weight loss.  HENT: Negative for congestion, ear discharge and nosebleeds.   Eyes: Negative for blurred vision.  Respiratory: Negative for cough, hemoptysis, sputum production, shortness of breath and wheezing.   Cardiovascular: Negative for chest pain, palpitations, orthopnea and claudication.  Gastrointestinal: Negative for abdominal pain, blood in stool, constipation, diarrhea, heartburn, melena, nausea and vomiting.  Genitourinary: Negative for dysuria, flank pain, frequency, hematuria and urgency.  Musculoskeletal: Negative for back pain, joint pain and myalgias.  Skin: Positive for rash.  Neurological: Negative for dizziness, tingling, focal weakness, seizures, weakness and headaches.  Endo/Heme/Allergies: Does not bruise/bleed easily.  Psychiatric/Behavioral: Negative for depression and suicidal ideas. The patient does not have insomnia.       Allergies  Allergen Reactions  . Morphine And Related Nausea Only     Past Medical History:  Diagnosis Date  . Anemia   . Arthritis   . Coronary artery disease   . Diverticulitis   . Dysrhythmia   . GERD (gastroesophageal reflux  disease)   . Heart murmur   . History of hiatal hernia     . Hyperglycemia   . Hyperlipidemia   . Hypertension   . Hypothyroidism   . Spinal stenosis      Past Surgical History:  Procedure Laterality Date  . BACK SURGERY    . CATARACT EXTRACTION W/PHACO Left 11/08/2014   Procedure: CATARACT EXTRACTION PHACO AND INTRAOCULAR LENS PLACEMENT (IOC);  Surgeon: Estill Cotta, MD;  Location: [facility] ORS;  Service: Ophthalmology;  Laterality: Left;  US:01:20 AP:21.8 CDE:31.09 PAC LOT: [id number]  . CHOLECYSTECTOMY    . ESOPHAGOGASTRODUODENOSCOPY (EGD) WITH PROPOFOL N/A 05/23/2017   Procedure: ESOPHAGOGASTRODUODENOSCOPY (EGD) WITH PROPOFOL;  Surgeon: Lollie Sails, MD;  Location: [facility] ENDOSCOPY;  Service: Endoscopy;  Laterality: N/A;  . EYE SURGERY    . IR FLUORO GUIDE PORT INSERTION RIGHT  06/07/2017  . JOINT REPLACEMENT Right    Knee  . ROTATOR CUFF REPAIR Right     Social History   Socioeconomic History  . Marital status: Married    Spouse name: Not on file  . Number of children: Not on file  . Years of education: Not on file  . Highest education level: Not on file  Social Needs  . Financial resource strain: Not on file  . Food insecurity - worry: Not on file  . Food insecurity - inability: Not on file  . Transportation needs - medical: Not on file  . Transportation needs - non-medical: Not on file  Occupational History  . Not on file  Tobacco Use  . Smoking status: Former Smoker    Last attempt to quit: 03/24/1989    Years since quitting: 28.2  . Smokeless tobacco: Never Used  Substance and Sexual Activity  . Alcohol use: No    Frequency: Never  . Drug use: No  . Sexual activity: Not on file  Other Topics Concern  . Not on file  Social History Narrative  . Not on file    Family History  Problem Relation Age of Onset  . Colon cancer Mother      Current Outpatient Medications:  .  acetaminophen (TYLENOL) 650 MG CR tablet, Take 650 mg by mouth every 8 (eight) hours as needed for pain., Disp: , Rfl:  .  ascorbic  acid (VITAMIN C) 1000 MG tablet, Take 1,000 mg by mouth daily., Disp: , Rfl:  .  atorvastatin (LIPITOR) 10 MG tablet, Take 10 mg by mouth daily., Disp: , Rfl:  .  BACILLUS COAGULANS-INULIN PO, Take by mouth., Disp: , Rfl:  .  cholecalciferol (VITAMIN D) 1000 units tablet, Take 1,000 Units by mouth daily., Disp: , Rfl:  .  co-enzyme Q-10 30 MG capsule, Take 30 mg by mouth daily., Disp: , Rfl:  .  dexamethasone (DECADRON) 4 MG tablet, Take 2 tablets (8 mg total) by mouth daily. Start the day after chemotherapy for 2 days., Disp: 30 tablet, Rfl: 1 .  docusate sodium (COLACE) 100 MG capsule, Take 100 mg by mouth daily., Disp: , Rfl:  .  HYDROcodone-acetaminophen (NORCO) 5-325 MG tablet, Take 1 tablet by mouth every 6 (six) hours as needed for moderate pain., Disp: 20 tablet, Rfl: 0 .  levothyroxine (SYNTHROID, LEVOTHROID) 100 MCG tablet, Take 25 mcg by mouth daily before breakfast., Disp: , Rfl:  .  levothyroxine (SYNTHROID, LEVOTHROID) 25 MCG tablet, Take 25 mcg by mouth daily before breakfast., Disp: , Rfl:  .  lidocaine-prilocaine (EMLA) cream, Apply to affected area once, Disp: 30 g, Rfl: 3 .  LORazepam (ATIVAN) 0.5 MG tablet, Take 1 tablet (0.5 mg total) by mouth every 6 (six) hours as needed (Nausea or vomiting)., Disp: 30 tablet, Rfl: 0 .  metoprolol tartrate (LOPRESSOR) 25 MG tablet, Take 25 mg by mouth daily., Disp: , Rfl:  .  Multiple Vitamin (MULTIVITAMIN) tablet, Take 1 tablet by mouth daily., Disp: , Rfl:  .  Omega-3 1000 MG CAPS, Take 340-1,000 capsules by mouth 2 (two) times daily., Disp: , Rfl:  .  omeprazole (PRILOSEC) 20 MG capsule, Take 20 mg by mouth daily., Disp: , Rfl:  .  ondansetron (ZOFRAN) 8 MG tablet, Take 1 tablet (8 mg total) by mouth 2 (two) times daily as needed for refractory nausea / vomiting. Start on day 3 after chemo., Disp: 30 tablet, Rfl: 1 .  pravastatin (PRAVACHOL) 20 MG tablet, Take 20 mg by mouth daily., Disp: , Rfl:  .  predniSONE (DELTASONE) 5 MG tablet, Take  5 mg by mouth daily with breakfast., Disp: , Rfl:  .  Probiotic Product (PROBIOTIC COMPLEX ACIDOPHILUS PO), Take by mouth., Disp: , Rfl:  .  prochlorperazine (COMPAZINE) 10 MG tablet, Take 1 tablet (10 mg total) by mouth every 6 (six) hours as needed (Nausea or vomiting)., Disp: 30 tablet, Rfl: 1 .  quinapril (ACCUPRIL) 40 MG tablet, Take 40 mg by mouth 1 day or 1 dose., Disp: , Rfl:  .  sucralfate (CARAFATE) 1 g tablet, Take 1 tablet (1 g total) by mouth 3 (three) times daily. Dissolve in 2-3 tbsp warm water, swish and swallow., Disp: 90 tablet, Rfl: 3 .  tamsulosin (FLOMAX) 0.4 MG CAPS capsule, Take 0.4 mg by mouth daily., Disp: , Rfl:  .  traMADol (ULTRAM-ER) 100 MG 24 hr tablet, Take 50 mg by mouth 2 (two) times daily. With lunch and dinner , Disp: , Rfl:  .  traMADol (ULTRAM-ER) 300 MG 24 hr tablet, Take 300 mg by mouth daily. AM dose, Disp: , Rfl:  .  vitamin B-12 (CYANOCOBALAMIN) 1000 MCG tablet, Take 1,500 mcg by mouth daily. , Disp: , Rfl:  No current facility-administered medications for this visit.   Facility-Administered Medications Ordered in Other Visits:  .  heparin lock flush 100 unit/mL, 500 Units, Intravenous, Once, Randa Evens C, MD .  sodium chloride flush (NS) 0.9 % injection 10 mL, 10 mL, Intravenous, PRN, Sindy Guadeloupe, MD, 10 mL at 07/04/17 0804  Physical exam:  Vitals:   07/04/17 0844  BP: 123/80  Pulse: (!) 55  Temp: (!) 97.5 F (36.4 C)  TempSrc: Tympanic  Weight: 180 lb 5 oz (81.8 kg)   Physical Exam  Constitutional: He is oriented to person, place, and time and well-developed, well-nourished, and in no distress.  HENT:  Head: Normocephalic and atraumatic.  Eyes: EOM are normal. Pupils are equal, round, and reactive to light.  Neck: Normal range of motion.  Cardiovascular: Normal rate, regular rhythm and normal heart sounds.  Pulmonary/Chest: Effort normal and breath sounds normal.  Abdominal: Soft. Bowel sounds are normal.  Neurological: He is alert  and oriented to person, place, and time.  Skin: Skin is warm and dry.  Maculopapular erythematous rash oval in shape about 4 cm seen on the undersurface of right arm.     CMP Latest Ref Rng & Units 07/04/2017  Glucose 65 - 99 mg/dL 139(H)  BUN 6 - 20 mg/dL 36(H)  Creatinine 0.61 - 1.24 mg/dL 1.02  Sodium 135 - 145 mmol/L 137  Potassium 3.5 - 5.1 mmol/L 3.9  Chloride  101 - 111 mmol/L 105  CO2 22 - 32 mmol/L 25  Calcium 8.9 - 10.3 mg/dL 9.1  Total Protein 6.5 - 8.1 g/dL 6.8  Total Bilirubin 0.3 - 1.2 mg/dL 0.4  Alkaline Phos 38 - 126 U/L 37(L)  AST 15 - 41 U/L 29  ALT 17 - 63 U/L 30   CBC Latest Ref Rng & Units 07/04/2017  WBC 3.8 - 10.6 K/uL 3.3(L)  Hemoglobin 13.0 - 18.0 g/dL 10.6(L)  Hematocrit 40.0 - 52.0 % 30.5(L)  Platelets 150 - 440 K/uL 139(L)    No images are attached to the encounter.  Ct Biopsy  Result Date: 06/06/2017 INDICATION: Esophageal malignancy, PET positive para-aortic adenopathy EXAM: CT-GUIDED BIOPSY LEFT RETROPERITONEAL PARA-AORTIC ADENOPATHY MEDICATIONS: 1% lidocaine local ANESTHESIA/SEDATION: 2.0 mg IV Versed; 75 mcg IV Fentanyl Moderate Sedation Time:  15 minutes The patient was continuously monitored during the procedure by the interventional radiology nurse under my direct supervision. PROCEDURE: The procedure, risks, benefits, and alternatives were explained to the patient. Questions regarding the procedure were encouraged and answered. The patient understands and consents to the procedure. Previous imaging reviewed. Patient positioned prone. Noncontrast localization CT performed. The left periaortic adenopathy was localized. Overlying skin marked for posterior approach. Under sterile conditions and local anesthesia, a 17 gauge 11.8 cm access needle was advanced from a posterior approach to the left periaortic adenopathy. Needle position confirmed with CT. Several 18 gauge cores obtained. Samples placed on a saline moist Telfa. Needle removed. Postprocedure  imaging demonstrates no hemorrhage or hematoma. Patient tolerated the procedure well without complication. Vital sign monitoring by nursing staff during the procedure will continue as patient is in the special procedures unit for post procedure observation. FINDINGS: The images document guide needle placement within the left para-aortic adenopathy. Post biopsy images demonstrate no hemorrhage or hematoma. COMPLICATIONS: None immediate. IMPRESSION: Successful CT-guided core biopsy of the left para-aortic adenopathy. Electronically Signed   By: Jerilynn Mages.  Shick M.D.   On: 06/06/2017 10:02   Ir Fluoro Guide Port Insertion Right  Result Date: 06/07/2017 CLINICAL DATA:  Distal esophageal adenocarcinoma metastatic to retroperitoneal lymph nodes. Port placement requested prior to beginning chemotherapy. EXAM: IMPLANTED PORT A CATH PLACEMENT WITH ULTRASOUND AND FLUOROSCOPIC GUIDANCE ANESTHESIA/SEDATION: 3.0 mg IV Versed; 100 mcg IV Fentanyl Total Moderate Sedation Time:  30 minutes The patient's level of consciousness and physiologic status were continuously monitored during the procedure by Radiology nursing. Additional Medications: 2 g IV Ancef. FLUOROSCOPY TIME:  12 seconds.  3.0 mGy. PROCEDURE: The procedure, risks, benefits, and alternatives were explained to the patient. Questions regarding the procedure were encouraged and answered. The patient understands and consents to the procedure. A time-out was performed prior to initiating the procedure. Ultrasound was utilized to confirm patency of the right internal jugular vein. The right neck and chest were prepped with chlorhexidine in a sterile fashion, and a sterile drape was applied covering the operative field. Maximum barrier sterile technique with sterile gowns and gloves were used for the procedure. Local anesthesia was provided with 1% lidocaine. After creating a small venotomy incision, a 21 gauge needle was advanced into the right internal jugular vein under  direct, real-time ultrasound guidance. Ultrasound image documentation was performed. After securing guidewire access, an 8 Fr dilator was placed. A J-wire was kinked to measure appropriate catheter length. A subcutaneous port pocket was then created along the upper chest wall utilizing sharp and blunt dissection. Portable cautery was utilized. The pocket was irrigated with sterile saline. A single lumen power  injectable port was chosen for placement. The 8 Fr catheter was tunneled from the port pocket site to the venotomy incision. The port was placed in the pocket. External catheter was trimmed to appropriate length based on guidewire measurement. At the venotomy, an 8 Fr peel-away sheath was placed over a guidewire. The catheter was then placed through the sheath and the sheath removed. Final catheter positioning was confirmed and documented with a fluoroscopic spot image. The port was accessed with a needle and aspirated and flushed with heparinized saline. The access needle was removed. The venotomy and port pocket incisions were closed with subcutaneous 3-0 Monocryl and subcuticular 4-0 Vicryl. Dermabond was applied to both incisions. COMPLICATIONS: COMPLICATIONS None FINDINGS: After catheter placement, the tip lies at the cavo-atrial junction. The catheter aspirates normally and is ready for immediate use. IMPRESSION: Placement of single lumen port a cath via right internal jugular vein. The catheter tip lies at the cavo-atrial junction. A power injectable port a cath was placed and is ready for immediate use. Electronically Signed   By: Aletta Edouard M.D.   On: 06/07/2017 09:39     Assessment and plan- Patient is a 84 y.o. male  with stage IV esophageal adenocarcinomacTX N0M1with metastases to the para-aortic lymph node here for on treatment assessment prior to cycle # 4 of weekly carbo/Taxol along with radiation  Counts ok to proceed with cycle 4 of weekly carbo/taxol with RT today. He will  proceed to cycle 5 of chemotherapy next week and I will see him back in 2 weeks for cycle 6 of carbo/taxol which will be his last treatment depending on his counts. Mild leukopenia with wbc of 3.3. anc 2.3 today. Platelet count mildly low at 136. He has 3 cycles of chemotherapy to go including today. I will therefore dose reduce carboplatin to AUC 1.5 and taxol to 45 mg/meter square from 50 mg/meter square  He will get neupogen tomorrow and repeat cbc on 07/08/17. Depending on his wbc/ anc- will decide if he needs more neupogen  Skin rash- does not appear to be due to chemo. Location makes contact dermatitis more likely. Continue topical steroids     Visit Diagnosis 1. Malignant neoplasm of esophagus, unspecified location ([place])   2. Encounter for antineoplastic chemotherapy   3. Contact dermatitis, unspecified contact dermatitis type, unspecified trigger   4. Chemotherapy-induced thrombocytopenia      Dr. Randa Evens, MD, MPH [facility] at [facility] Pager- [phone] 07/04/2017 9:54 AM

## 2017-07-04 NOTE — Progress Notes (Signed)
Pt in for follow up.  Reports continued burning in chest from radiation.  Reports frequent nose bleeds in the mornings.  States has a rash under right arm.

## 2017-07-05 ENCOUNTER — Inpatient Hospital Stay: Payer: Medicare Other

## 2017-07-05 ENCOUNTER — Ambulatory Visit
Admission: RE | Admit: 2017-07-05 | Discharge: 2017-07-05 | Disposition: A | Discharge status: Home / Self Care | Payer: Medicare Other | Source: Ambulatory Visit | Attending: Radiation Oncology | Admitting: Radiation Oncology

## 2017-07-05 DIAGNOSIS — Z5111 Encounter for antineoplastic chemotherapy: Secondary | ICD-10-CM

## 2017-07-05 DIAGNOSIS — Z510 Encounter for antineoplastic radiation therapy: Secondary | ICD-10-CM | POA: Diagnosis not present

## 2017-07-05 DIAGNOSIS — C159 Malignant neoplasm of esophagus, unspecified: Secondary | ICD-10-CM

## 2017-07-05 MED ORDER — TBO-FILGRASTIM 480 MCG/0.8ML ~~LOC~~ SOSY
480.0000 ug | PREFILLED_SYRINGE | Freq: Every day | SUBCUTANEOUS | Status: DC
  Administered 2017-07-05: 480 ug via SUBCUTANEOUS

## 2017-07-08 ENCOUNTER — Inpatient Hospital Stay: Payer: Medicare Other

## 2017-07-08 ENCOUNTER — Ambulatory Visit
Admission: RE | Admit: 2017-07-08 | Discharge: 2017-07-08 | Disposition: A | Discharge status: Home / Self Care | Payer: Medicare Other | Source: Ambulatory Visit | Attending: Radiation Oncology | Admitting: Radiation Oncology

## 2017-07-08 DIAGNOSIS — Z510 Encounter for antineoplastic radiation therapy: Secondary | ICD-10-CM | POA: Diagnosis not present

## 2017-07-08 DIAGNOSIS — C159 Malignant neoplasm of esophagus, unspecified: Secondary | ICD-10-CM

## 2017-07-08 DIAGNOSIS — Z5111 Encounter for antineoplastic chemotherapy: Secondary | ICD-10-CM | POA: Diagnosis not present

## 2017-07-08 LAB — COMPREHENSIVE METABOLIC PANEL
ALK PHOS: 38 U/L (ref 38–126)
ALT: 32 U/L (ref 17–63)
AST: 35 U/L (ref 15–41)
Albumin: 3.6 g/dL (ref 3.5–5.0)
Anion gap: 12 (ref 5–15)
BILIRUBIN TOTAL: 0.6 mg/dL (ref 0.3–1.2)
BUN: 49 mg/dL — ABNORMAL HIGH (ref 6–20)
CALCIUM: 9 mg/dL (ref 8.9–10.3)
CHLORIDE: 103 mmol/L (ref 101–111)
CO2: 20 mmol/L — ABNORMAL LOW (ref 22–32)
CREATININE: 1.39 mg/dL — AB (ref 0.61–1.24)
GFR, EST AFRICAN AMERICAN: 53 mL/min — AB (ref >60)
GFR, EST NON AFRICAN AMERICAN: 46 mL/min — AB (ref >60)
Glucose, Bld: 113 mg/dL — ABNORMAL HIGH (ref 65–99)
Potassium: 3.8 mmol/L (ref 3.5–5.1)
Sodium: 135 mmol/L (ref 135–145)
TOTAL PROTEIN: 6.7 g/dL (ref 6.5–8.1)

## 2017-07-08 LAB — CBC WITH DIFFERENTIAL/PLATELET
BASOS PCT: 0 %
Basophils Absolute: 0 10*3/uL (ref 0–0.1)
EOS ABS: 0 10*3/uL (ref 0–0.7)
Eosinophils Relative: 0 %
HCT: 33.3 % — ABNORMAL LOW (ref 40.0–52.0)
Hemoglobin: 11.3 g/dL — ABNORMAL LOW (ref 13.0–18.0)
Lymphocytes Relative: 11 %
Lymphs Abs: 0.5 10*3/uL — ABNORMAL LOW (ref 1.0–3.6)
MCH: 33.7 pg (ref 26.0–34.0)
MCHC: 33.9 g/dL (ref 32.0–36.0)
MCV: 99.6 fL (ref 80.0–100.0)
Monocytes Absolute: 0.2 10*3/uL (ref 0.2–1.0)
Monocytes Relative: 6 %
NEUTROS PCT: 83 %
Neutro Abs: 3.3 10*3/uL (ref 1.4–6.5)
Platelets: 110 10*3/uL — ABNORMAL LOW (ref 150–440)
RBC: 3.34 MIL/uL — AB (ref 4.40–5.90)
RDW: 13.1 % (ref 11.5–14.5)
WBC: 4 10*3/uL (ref 3.8–10.6)

## 2017-07-09 ENCOUNTER — Ambulatory Visit: Payer: Medicare Other

## 2017-07-10 ENCOUNTER — Ambulatory Visit
Admission: RE | Admit: 2017-07-10 | Discharge: 2017-07-10 | Disposition: A | Discharge status: Home / Self Care | Payer: Medicare Other | Source: Ambulatory Visit | Attending: Radiation Oncology | Admitting: Radiation Oncology

## 2017-07-10 DIAGNOSIS — Z510 Encounter for antineoplastic radiation therapy: Secondary | ICD-10-CM | POA: Diagnosis not present

## 2017-07-11 ENCOUNTER — Other Ambulatory Visit: Payer: Self-pay | Admitting: Oncology

## 2017-07-11 ENCOUNTER — Inpatient Hospital Stay: Payer: Medicare Other

## 2017-07-11 ENCOUNTER — Ambulatory Visit: Payer: Medicare Other | Admitting: Oncology

## 2017-07-11 ENCOUNTER — Ambulatory Visit
Admission: RE | Admit: 2017-07-11 | Discharge: 2017-07-11 | Disposition: A | Discharge status: Home / Self Care | Payer: Medicare Other | Source: Ambulatory Visit | Attending: Radiation Oncology | Admitting: Radiation Oncology

## 2017-07-11 VITALS — BP 131/73 | HR 93 | Temp 95.9°F | Resp 18 | Wt 177.5 lb

## 2017-07-11 DIAGNOSIS — T451X5A Adverse effect of antineoplastic and immunosuppressive drugs, initial encounter: Principal | ICD-10-CM

## 2017-07-11 DIAGNOSIS — Z5111 Encounter for antineoplastic chemotherapy: Secondary | ICD-10-CM | POA: Diagnosis not present

## 2017-07-11 DIAGNOSIS — Z510 Encounter for antineoplastic radiation therapy: Secondary | ICD-10-CM | POA: Diagnosis not present

## 2017-07-11 DIAGNOSIS — D701 Agranulocytosis secondary to cancer chemotherapy: Secondary | ICD-10-CM

## 2017-07-11 DIAGNOSIS — C159 Malignant neoplasm of esophagus, unspecified: Secondary | ICD-10-CM

## 2017-07-11 LAB — COMPREHENSIVE METABOLIC PANEL
ALBUMIN: 3.5 g/dL (ref 3.5–5.0)
ALT: 24 U/L (ref 17–63)
AST: 32 U/L (ref 15–41)
Alkaline Phosphatase: 36 U/L — ABNORMAL LOW (ref 38–126)
Anion gap: 9 (ref 5–15)
BUN: 32 mg/dL — AB (ref 6–20)
CHLORIDE: 104 mmol/L (ref 101–111)
CO2: 22 mmol/L (ref 22–32)
CREATININE: 1.15 mg/dL (ref 0.61–1.24)
Calcium: 8.7 mg/dL — ABNORMAL LOW (ref 8.9–10.3)
GFR calc Af Amer: >60 mL/min (ref >60)
GFR, EST NON AFRICAN AMERICAN: 58 mL/min — AB (ref >60)
Glucose, Bld: 113 mg/dL — ABNORMAL HIGH (ref 65–99)
POTASSIUM: 4 mmol/L (ref 3.5–5.1)
SODIUM: 135 mmol/L (ref 135–145)
Total Bilirubin: 0.4 mg/dL (ref 0.3–1.2)
Total Protein: 6.5 g/dL (ref 6.5–8.1)

## 2017-07-11 LAB — CBC WITH DIFFERENTIAL/PLATELET
Basophils Absolute: 0 10*3/uL (ref 0–0.1)
Basophils Relative: 0 %
EOS ABS: 0 10*3/uL (ref 0–0.7)
EOS PCT: 1 %
HCT: 29.2 % — ABNORMAL LOW (ref 40.0–52.0)
HEMOGLOBIN: 10.2 g/dL — AB (ref 13.0–18.0)
LYMPHS ABS: 0.4 10*3/uL — AB (ref 1.0–3.6)
LYMPHS PCT: 15 %
MCH: 34.2 pg — AB (ref 26.0–34.0)
MCHC: 34.8 g/dL (ref 32.0–36.0)
MCV: 98.5 fL (ref 80.0–100.0)
MONOS PCT: 12 %
Monocytes Absolute: 0.3 10*3/uL (ref 0.2–1.0)
Neutro Abs: 2 10*3/uL (ref 1.4–6.5)
Neutrophils Relative %: 72 %
PLATELETS: 132 10*3/uL — AB (ref 150–440)
RBC: 2.97 MIL/uL — AB (ref 4.40–5.90)
RDW: 13.2 % (ref 11.5–14.5)
WBC: 2.8 10*3/uL — ABNORMAL LOW (ref 3.8–10.6)

## 2017-07-11 MED ORDER — SODIUM CHLORIDE 0.9% FLUSH
10.0000 mL | Freq: Once | INTRAVENOUS | Status: AC
  Administered 2017-07-11: 10 mL via INTRAVENOUS
  Filled 2017-07-11: qty 10

## 2017-07-11 MED ORDER — FAMOTIDINE IN NACL 20-0.9 MG/50ML-% IV SOLN: 20.0000 mg | Freq: Once | INTRAVENOUS | Status: DC

## 2017-07-11 MED ORDER — HEPARIN SOD (PORK) LOCK FLUSH 100 UNIT/ML IV SOLN
500.0000 [IU] | Freq: Once | INTRAVENOUS | Status: AC
  Administered 2017-07-11: 500 [IU] via INTRAVENOUS
  Filled 2017-07-11: qty 5

## 2017-07-11 MED ORDER — SODIUM CHLORIDE 0.9 % IV SOLN
20.0000 mg | Freq: Once | INTRAVENOUS | Status: AC
  Administered 2017-07-11: 20 mg via INTRAVENOUS
  Filled 2017-07-11: qty 2

## 2017-07-11 MED ORDER — HEPARIN SOD (PORK) LOCK FLUSH 100 UNIT/ML IV SOLN: 500.0000 [IU] | Freq: Once | INTRAVENOUS | Status: DC | PRN

## 2017-07-11 MED ORDER — SODIUM CHLORIDE 0.9 % IV SOLN
Freq: Once | INTRAVENOUS | Status: AC
  Administered 2017-07-11: 10:00:00 via INTRAVENOUS
  Filled 2017-07-11: qty 100

## 2017-07-11 MED ORDER — PALONOSETRON HCL INJECTION 0.25 MG/5ML
0.2500 mg | Freq: Once | INTRAVENOUS | Status: AC
  Administered 2017-07-11: 0.25 mg via INTRAVENOUS
  Filled 2017-07-11: qty 5

## 2017-07-11 MED ORDER — SODIUM CHLORIDE 0.9 % IV SOLN
125.0000 mg | Freq: Once | INTRAVENOUS | Status: AC
  Administered 2017-07-11: 130 mg via INTRAVENOUS
  Filled 2017-07-11: qty 13

## 2017-07-11 MED ORDER — SODIUM CHLORIDE 0.9% FLUSH
10.0000 mL | INTRAVENOUS | Status: DC | PRN
Start: 2017-07-11 — End: 2017-07-11
  Filled 2017-07-11: qty 10

## 2017-07-11 MED ORDER — DIPHENHYDRAMINE HCL 50 MG/ML IJ SOLN
50.0000 mg | Freq: Once | INTRAMUSCULAR | Status: AC
  Administered 2017-07-11: 50 mg via INTRAVENOUS
  Filled 2017-07-11: qty 1

## 2017-07-11 MED ORDER — SODIUM CHLORIDE 0.9 % IV SOLN
45.0000 mg/m2 | Freq: Once | INTRAVENOUS | Status: AC
  Administered 2017-07-11: 90 mg via INTRAVENOUS
  Filled 2017-07-11: qty 15

## 2017-07-11 MED ORDER — SODIUM CHLORIDE 0.9 % IV SOLN
Freq: Once | INTRAVENOUS | Status: AC
  Administered 2017-07-11: 09:00:00 via INTRAVENOUS
  Filled 2017-07-11: qty 1000

## 2017-07-11 NOTE — Progress Notes (Signed)
neupogen on 07/12/17. neupogen on 07/15/17 depending on cbc  Dr. Randa Evens, MD, MPH [facility] at [facility] Pager- [phone] 07/11/2017 8:37 AM

## 2017-07-11 NOTE — Progress Notes (Signed)
Per Judeen Hammans RN per Dr. Janese Banks proceed with treatment with 07/11/17 labs, he will need neupogen 07/12/17 and then 3/25 lab and poss. Neupogen. Pt reports nose bleeds every morning, they are "not pouring" and they stop easily and Vaseline helps. Pt reports abdominal pain but he states he has not had a BM 2 days (which he reports is not abnormal) and he took a laxative, he believes the gas pains are coming from that! Judeen Hammans RN aware and review with Dr. Janese Banks.  Sherry at chairside to review plan of care.

## 2017-07-11 NOTE — Progress Notes (Signed)
Nutrition Follow-up:  Patient with esophageal cancer with mets to para-aortic lymph node.  Patient receiving radiation and chemotherapy.  Noted patient is not a surgical candidate.  Patient followed by Dr. Janese Banks.  Met with patient during infusion today with wife present.  Patient reports last week he has had issues with constipation then diarrhea, no appetite.  Reports that he has been drinking boost protein max (30 gm of protein and 160 calories)2 times per day.  Can't drink any more than that.  Reports that he sometimes drinks shake in the morning and sometimes does not.  Eats breakfast/lunch around 11 am of 2 eggs, piece of toast with jelly and butter.  Then may have a snack around 2pm and dinner is at 6:30pm.  Reports ate meatballs and noodles last night for dinner but not eating the same volume as before.  Sometimes for after radiation treatment in the afternoon will get a fast food burger or sandwich.     Medications: reviewed  Labs: glucose 113, BUN 32, creatinine 1.15  Anthropometrics:   Weight has decreased to 177 lb 8 oz today from 180 lb 2oz on 3/7.  UBW in the 190s per patient on initial visit but unsure when last at that weight.    3% weight loss in the last 2 months   NUTRITION DIAGNOSIS: Inadequate oral intake continues   MALNUTRITION DIAGNOSIS: none at this time   INTERVENTION:   Offered suggestions on ways to increase calories and protein in current eating pattern. Patient not receptive to suggestions.   Discussed oral nutrition supplements again and encouraged patient to try high calorie shake that what he is currently drinking to provided more nutrition and prevent further weight loss.    MONITORING, EVALUATION, GOAL: Patient will consume adequate calories and protein to meet nutritional goals.   NEXT VISIT: patient to contact me as if RD can be of assistance.     Gabrial Poppell B. Zenia Resides, [place], [place] Registered Dietitian [phone] (pager)

## 2017-07-12 ENCOUNTER — Inpatient Hospital Stay: Payer: Medicare Other

## 2017-07-12 ENCOUNTER — Ambulatory Visit
Admission: RE | Admit: 2017-07-12 | Discharge: 2017-07-12 | Disposition: A | Discharge status: Home / Self Care | Payer: Medicare Other | Source: Ambulatory Visit | Attending: Radiation Oncology | Admitting: Radiation Oncology

## 2017-07-12 DIAGNOSIS — Z5111 Encounter for antineoplastic chemotherapy: Secondary | ICD-10-CM

## 2017-07-12 DIAGNOSIS — Z510 Encounter for antineoplastic radiation therapy: Secondary | ICD-10-CM | POA: Diagnosis not present

## 2017-07-12 DIAGNOSIS — C159 Malignant neoplasm of esophagus, unspecified: Secondary | ICD-10-CM

## 2017-07-12 MED ORDER — TBO-FILGRASTIM 480 MCG/0.8ML ~~LOC~~ SOSY
480.0000 ug | PREFILLED_SYRINGE | Freq: Every day | SUBCUTANEOUS | Status: AC
  Administered 2017-07-12: 480 ug via SUBCUTANEOUS

## 2017-07-15 ENCOUNTER — Inpatient Hospital Stay: Payer: Medicare Other

## 2017-07-15 ENCOUNTER — Ambulatory Visit
Admission: RE | Admit: 2017-07-15 | Discharge: 2017-07-15 | Disposition: A | Discharge status: Home / Self Care | Payer: Medicare Other | Source: Ambulatory Visit | Attending: Radiation Oncology | Admitting: Radiation Oncology

## 2017-07-15 DIAGNOSIS — Z5111 Encounter for antineoplastic chemotherapy: Secondary | ICD-10-CM | POA: Diagnosis not present

## 2017-07-15 DIAGNOSIS — Z510 Encounter for antineoplastic radiation therapy: Secondary | ICD-10-CM | POA: Diagnosis not present

## 2017-07-15 DIAGNOSIS — T451X5A Adverse effect of antineoplastic and immunosuppressive drugs, initial encounter: Principal | ICD-10-CM

## 2017-07-15 DIAGNOSIS — D701 Agranulocytosis secondary to cancer chemotherapy: Secondary | ICD-10-CM

## 2017-07-15 LAB — CBC WITH DIFFERENTIAL/PLATELET
Basophils Absolute: 0 10*3/uL (ref 0–0.1)
Basophils Relative: 0 %
EOS PCT: 0 %
Eosinophils Absolute: 0 10*3/uL (ref 0–0.7)
HEMATOCRIT: 31.5 % — AB (ref 40.0–52.0)
Hemoglobin: 10.5 g/dL — ABNORMAL LOW (ref 13.0–18.0)
LYMPHS PCT: 14 %
Lymphs Abs: 0.5 10*3/uL — ABNORMAL LOW (ref 1.0–3.6)
MCH: 33.6 pg (ref 26.0–34.0)
MCHC: 33.3 g/dL (ref 32.0–36.0)
MCV: 100.7 fL — AB (ref 80.0–100.0)
MONO ABS: 0.2 10*3/uL (ref 0.2–1.0)
MONOS PCT: 4 %
NEUTROS ABS: 3.3 10*3/uL (ref 1.4–6.5)
Neutrophils Relative %: 82 %
Platelets: 155 10*3/uL (ref 150–440)
RBC: 3.13 MIL/uL — ABNORMAL LOW (ref 4.40–5.90)
RDW: 13.7 % (ref 11.5–14.5)
WBC: 4 10*3/uL (ref 3.8–10.6)

## 2017-07-16 ENCOUNTER — Ambulatory Visit
Admission: RE | Admit: 2017-07-16 | Discharge: 2017-07-16 | Disposition: A | Discharge status: Home / Self Care | Payer: Medicare Other | Source: Ambulatory Visit | Attending: Radiation Oncology | Admitting: Radiation Oncology

## 2017-07-16 DIAGNOSIS — Z510 Encounter for antineoplastic radiation therapy: Secondary | ICD-10-CM | POA: Diagnosis not present

## 2017-07-17 ENCOUNTER — Ambulatory Visit
Admission: RE | Admit: 2017-07-17 | Discharge: 2017-07-17 | Disposition: A | Discharge status: Home / Self Care | Payer: Medicare Other | Source: Ambulatory Visit | Attending: Radiation Oncology | Admitting: Radiation Oncology

## 2017-07-17 DIAGNOSIS — Z510 Encounter for antineoplastic radiation therapy: Secondary | ICD-10-CM | POA: Diagnosis not present

## 2017-07-18 ENCOUNTER — Ambulatory Visit
Admission: RE | Admit: 2017-07-18 | Discharge: 2017-07-18 | Disposition: A | Discharge status: Home / Self Care | Payer: Medicare Other | Source: Ambulatory Visit | Attending: Radiation Oncology | Admitting: Radiation Oncology

## 2017-07-18 ENCOUNTER — Inpatient Hospital Stay (HOSPITAL_BASED_OUTPATIENT_CLINIC_OR_DEPARTMENT_OTHER): Payer: Medicare Other | Admitting: Oncology

## 2017-07-18 ENCOUNTER — Inpatient Hospital Stay: Payer: Medicare Other

## 2017-07-18 ENCOUNTER — Encounter: Payer: Self-pay | Admitting: Oncology

## 2017-07-18 VITALS — BP 119/75 | HR 83 | Temp 96.1°F | Resp 18 | Ht 71.0 in | Wt 176.4 lb

## 2017-07-18 DIAGNOSIS — C155 Malignant neoplasm of lower third of esophagus: Secondary | ICD-10-CM

## 2017-07-18 DIAGNOSIS — C159 Malignant neoplasm of esophagus, unspecified: Secondary | ICD-10-CM

## 2017-07-18 DIAGNOSIS — Z79899 Other long term (current) drug therapy: Secondary | ICD-10-CM

## 2017-07-18 DIAGNOSIS — Z5111 Encounter for antineoplastic chemotherapy: Secondary | ICD-10-CM | POA: Diagnosis not present

## 2017-07-18 DIAGNOSIS — Z510 Encounter for antineoplastic radiation therapy: Secondary | ICD-10-CM | POA: Diagnosis not present

## 2017-07-18 DIAGNOSIS — Z7189 Other specified counseling: Secondary | ICD-10-CM

## 2017-07-18 LAB — COMPREHENSIVE METABOLIC PANEL
ALBUMIN: 3.2 g/dL — AB (ref 3.5–5.0)
ALK PHOS: 39 U/L (ref 38–126)
ALT: 22 U/L (ref 17–63)
ANION GAP: 8 (ref 5–15)
AST: 27 U/L (ref 15–41)
BILIRUBIN TOTAL: 0.3 mg/dL (ref 0.3–1.2)
BUN: 40 mg/dL — AB (ref 6–20)
CALCIUM: 8.9 mg/dL (ref 8.9–10.3)
CO2: 22 mmol/L (ref 22–32)
Chloride: 106 mmol/L (ref 101–111)
Creatinine, Ser: 1.12 mg/dL (ref 0.61–1.24)
GFR calc Af Amer: >60 mL/min (ref >60)
GFR calc non Af Amer: 60 mL/min — ABNORMAL LOW (ref >60)
GLUCOSE: 144 mg/dL — AB (ref 65–99)
Potassium: 4 mmol/L (ref 3.5–5.1)
Sodium: 136 mmol/L (ref 135–145)
TOTAL PROTEIN: 6.4 g/dL — AB (ref 6.5–8.1)

## 2017-07-18 LAB — CBC WITH DIFFERENTIAL/PLATELET
BASOS ABS: 0 10*3/uL (ref 0–0.1)
BASOS PCT: 0 %
EOS PCT: 0 %
Eosinophils Absolute: 0 10*3/uL (ref 0–0.7)
HCT: 27.5 % — ABNORMAL LOW (ref 40.0–52.0)
Hemoglobin: 9.5 g/dL — ABNORMAL LOW (ref 13.0–18.0)
Lymphocytes Relative: 22 %
Lymphs Abs: 0.4 10*3/uL — ABNORMAL LOW (ref 1.0–3.6)
MCH: 34.4 pg — ABNORMAL HIGH (ref 26.0–34.0)
MCHC: 34.5 g/dL (ref 32.0–36.0)
MCV: 99.6 fL (ref 80.0–100.0)
MONO ABS: 0.3 10*3/uL (ref 0.2–1.0)
Monocytes Relative: 15 %
NEUTROS ABS: 1.1 10*3/uL — AB (ref 1.4–6.5)
Neutrophils Relative %: 63 %
PLATELETS: 185 10*3/uL (ref 150–440)
RBC: 2.76 MIL/uL — AB (ref 4.40–5.90)
RDW: 13.6 % (ref 11.5–14.5)
WBC: 1.8 10*3/uL — AB (ref 3.8–10.6)

## 2017-07-18 MED ORDER — HEPARIN SOD (PORK) LOCK FLUSH 100 UNIT/ML IV SOLN
500.0000 [IU] | Freq: Once | INTRAVENOUS | Status: AC
  Administered 2017-07-18: 500 [IU] via INTRAVENOUS
  Filled 2017-07-18: qty 5

## 2017-07-18 NOTE — Addendum Note (Signed)
Addended by: Luella Cook on: 07/18/2017 10:06 AM   Modules accepted: Orders

## 2017-07-18 NOTE — Addendum Note (Signed)
Addended by: Randa Evens C on: 07/18/2017 10:07 AM   Modules accepted: Orders

## 2017-07-18 NOTE — Progress Notes (Addendum)
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 He also has a history of multiple back surgeries and chronic back pain but is otherwise healthy and lives with his wife. He is independent of his ADLs. He was seen by [facility] GI in August 2018 for symptoms of epigastric pain and some difficulty swallowing. He denies any change in his appetite or unintentional weight loss. He reports dysphagia to both solids and liquids.   Patient had a barium swallow in September 2018 which showed moderate sized reducible hiatal hernia without evidence of stricture or reflux or esophagitis. More proximally no stricture or spasm was observed. No laryngeal penetration of the barium  He continued to have symptoms and then underwent an upper endoscopy in January 2019 which showed a medium-sized fungating mass with no bleeding found in the distal esophagus 35-37 cm from the incisors. Mass was nonobstructing, partially obstructing and not circumferential. 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 Angiolymphatic invasion was present. Fundic gland polyp was noted in the stomach which was negative for H. pylori dysplasia and malignancy.  Patient underwent PET/CT scan on 05/28/2017 which showed small hypermetabolic mass in the  distal esophagus with an SUV of 10.7. Hypermetabolic left periaortic adenopathy compatible with malignancy. Larger of the 2 involved lymph nodes with a maximum SUV of 16.1. No other regions of tumor are identified.  Tumor marker testing shows he is positive for HER-2 and PDL 1 expressed  Biopsy of the left para-aortic lymph node positive for metastatic esophageal cancer   Interval history- he reports some pain and discomfort during swallowing since starting chemo/RT. He is currently on prednsione for the same. Reports having bodyaches and feeling dizzy 3 days after chemo when he comes off steroids. No falls. It takes a day for him to feel better  ECOG PS- 1 Pain scale- 0   Review of systems- Review of Systems  Constitutional: Negative for chills, fever, malaise/fatigue and weight loss.  HENT: Negative for congestion, ear discharge and nosebleeds.   Eyes: Negative for blurred vision.  Respiratory: Negative for cough, hemoptysis, sputum production, shortness of breath and wheezing.   Cardiovascular: Negative for chest pain, palpitations, orthopnea and claudication.  Gastrointestinal: Negative for abdominal pain, blood in stool, constipation, diarrhea, heartburn, melena, nausea and vomiting.  Genitourinary: Negative for dysuria, flank pain, frequency, hematuria and urgency.  Musculoskeletal: Negative for back pain, joint pain and myalgias.  Skin: Negative for rash.  Neurological: Negative for dizziness, tingling, focal weakness, seizures, weakness and headaches.  Endo/Heme/Allergies: Does not bruise/bleed easily.  Psychiatric/Behavioral: Negative for depression and suicidal ideas. The patient does not have insomnia.      Allergies  Allergen Reactions  . Morphine And Related Nausea Only     Past Medical History:  Diagnosis Date  . Anemia   . Arthritis   . Coronary artery disease   . Diverticulitis   . Dysrhythmia   . GERD (  gastroesophageal reflux disease)   . Heart murmur    . History of hiatal hernia   . Hyperglycemia   . Hyperlipidemia   . Hypertension   . Hypothyroidism   . Spinal stenosis      Past Surgical History:  Procedure Laterality Date  . BACK SURGERY    . CATARACT EXTRACTION W/PHACO Left 11/08/2014   Procedure: CATARACT EXTRACTION PHACO AND INTRAOCULAR LENS PLACEMENT (IOC);  Surgeon: Estill Cotta, MD;  Location: [facility] ORS;  Service: Ophthalmology;  Laterality: Left;  US:01:20 AP:21.8 CDE:31.09 PAC LOT: [id number]  . CHOLECYSTECTOMY    . ESOPHAGOGASTRODUODENOSCOPY (EGD) WITH PROPOFOL N/A 05/23/2017   Procedure: ESOPHAGOGASTRODUODENOSCOPY (EGD) WITH PROPOFOL;  Surgeon: Lollie Sails, MD;  Location: [facility] ENDOSCOPY;  Service: Endoscopy;  Laterality: N/A;  . EYE SURGERY    . IR FLUORO GUIDE PORT INSERTION RIGHT  06/07/2017  . JOINT REPLACEMENT Right    Knee  . ROTATOR CUFF REPAIR Right     Social History   Socioeconomic History  . Marital status: Married    Spouse name: Not on file  . Number of children: Not on file  . Years of education: Not on file  . Highest education level: Not on file  Occupational History  . Not on file  Social Needs  . Financial resource strain: Not on file  . Food insecurity:    Worry: Not on file    Inability: Not on file  . Transportation needs:    Medical: Not on file    Non-medical: Not on file  Tobacco Use  . Smoking status: Former Smoker    Last attempt to quit: 03/24/1989    Years since quitting: 28.3  . Smokeless tobacco: Never Used  Substance and Sexual Activity  . Alcohol use: No    Frequency: Never  . Drug use: No  . Sexual activity: Not on file  Lifestyle  . Physical activity:    Days per week: Not on file    Minutes per session: Not on file  . Stress: Not on file  Relationships  . Social connections:    Talks on phone: Not on file    Gets together: Not on file    Attends religious service: Not on file    Active member of club or organization: Not on file    Attends  meetings of clubs or organizations: Not on file    Relationship status: Not on file  . Intimate partner violence:    Fear of current or ex partner: Not on file    Emotionally abused: Not on file    Physically abused: Not on file    Forced sexual activity: Not on file  Other Topics Concern  . Not on file  Social History Narrative  . Not on file    Family History  Problem Relation Age of Onset  . Colon cancer Mother      Current Outpatient Medications:  .  acetaminophen (TYLENOL) 650 MG CR tablet, Take 650 mg by mouth every 8 (eight) hours as needed for pain., Disp: , Rfl:  .  ascorbic acid (VITAMIN C) 1000 MG tablet, Take 1,000 mg by mouth daily., Disp: , Rfl:  .  atorvastatin (LIPITOR) 10 MG tablet, Take 10 mg by mouth daily., Disp: , Rfl:  .  BACILLUS COAGULANS-INULIN PO, Take by mouth., Disp: , Rfl:  .  cholecalciferol (VITAMIN D) 1000 units tablet, Take 1,000 Units by mouth daily., Disp: , Rfl:  .  co-enzyme Q-10 30 MG capsule, Take  30 mg by mouth daily., Disp: , Rfl:  .  dexamethasone (DECADRON) 4 MG tablet, Take 2 tablets (8 mg total) by mouth daily. Start the day after chemotherapy for 2 days., Disp: 30 tablet, Rfl: 1 .  docusate sodium (COLACE) 100 MG capsule, Take 100 mg by mouth daily., Disp: , Rfl:  .  HYDROcodone-acetaminophen (NORCO) 5-325 MG tablet, Take 1 tablet by mouth every 6 (six) hours as needed for moderate pain., Disp: 20 tablet, Rfl: 0 .  levothyroxine (SYNTHROID, LEVOTHROID) 100 MCG tablet, Take 25 mcg by mouth daily before breakfast., Disp: , Rfl:  .  levothyroxine (SYNTHROID, LEVOTHROID) 25 MCG tablet, Take 25 mcg by mouth daily before breakfast., Disp: , Rfl:  .  lidocaine-prilocaine (EMLA) cream, Apply to affected area once, Disp: 30 g, Rfl: 3 .  LORazepam (ATIVAN) 0.5 MG tablet, Take 1 tablet (0.5 mg total) by mouth every 6 (six) hours as needed (Nausea or vomiting). (Patient not taking: Reported on 07/04/2017), Disp: 30 tablet, Rfl: 0 .  metoprolol  tartrate (LOPRESSOR) 25 MG tablet, Take 25 mg by mouth daily., Disp: , Rfl:  .  Multiple Vitamin (MULTIVITAMIN) tablet, Take 1 tablet by mouth daily., Disp: , Rfl:  .  Omega-3 1000 MG CAPS, Take 340-1,000 capsules by mouth 2 (two) times daily., Disp: , Rfl:  .  omeprazole (PRILOSEC) 20 MG capsule, Take 20 mg by mouth daily., Disp: , Rfl:  .  ondansetron (ZOFRAN) 8 MG tablet, Take 1 tablet (8 mg total) by mouth 2 (two) times daily as needed for refractory nausea / vomiting. Start on day 3 after chemo. (Patient not taking: Reported on 07/04/2017), Disp: 30 tablet, Rfl: 1 .  pravastatin (PRAVACHOL) 20 MG tablet, Take 20 mg by mouth daily., Disp: , Rfl:  .  predniSONE (DELTASONE) 5 MG tablet, Take 5 mg by mouth daily with breakfast., Disp: , Rfl:  .  Probiotic Product (PROBIOTIC COMPLEX ACIDOPHILUS PO), Take by mouth., Disp: , Rfl:  .  prochlorperazine (COMPAZINE) 10 MG tablet, Take 1 tablet (10 mg total) by mouth every 6 (six) hours as needed (Nausea or vomiting)., Disp: 30 tablet, Rfl: 1 .  quinapril (ACCUPRIL) 40 MG tablet, Take 40 mg by mouth 1 day or 1 dose., Disp: , Rfl:  .  sucralfate (CARAFATE) 1 g tablet, Take 1 tablet (1 g total) by mouth 3 (three) times daily. Dissolve in 2-3 tbsp warm water, swish and swallow., Disp: 90 tablet, Rfl: 3 .  tamsulosin (FLOMAX) 0.4 MG CAPS capsule, Take 0.4 mg by mouth daily., Disp: , Rfl:  .  traMADol (ULTRAM-ER) 100 MG 24 hr tablet, Take 50 mg by mouth 2 (two) times daily. With lunch and dinner , Disp: , Rfl:  .  traMADol (ULTRAM-ER) 300 MG 24 hr tablet, Take 300 mg by mouth daily. AM dose, Disp: , Rfl:  .  vitamin B-12 (CYANOCOBALAMIN) 1000 MCG tablet, Take 1,500 mcg by mouth daily. , Disp: , Rfl:   Physical exam:  Vitals:   07/18/17 0909  BP: 119/75  Pulse: 83  Resp: 18  Temp: (!) 96.1 F (35.6 C)  TempSrc: Tympanic  Weight: 176 lb 6.4 oz (80 kg)  Height: '5\' 11"'  (1.803 m)   Physical Exam  Constitutional: He is oriented to person, place, and time  and well-developed, well-nourished, and in no distress.  HENT:  Head: Normocephalic and atraumatic.  Mouth/Throat: Oropharynx is clear and moist.  Eyes: Pupils are equal, round, and reactive to light. EOM are normal.  Neck: Normal range  of motion.  Cardiovascular: Normal rate, regular rhythm and normal heart sounds.  Pulmonary/Chest: Effort normal and breath sounds normal.  Abdominal: Soft. Bowel sounds are normal.  Neurological: He is alert and oriented to person, place, and time.  Skin: Skin is warm and dry.     CMP Latest Ref Rng & Units 07/11/2017  Glucose 65 - 99 mg/dL 113(H)  BUN 6 - 20 mg/dL 32(H)  Creatinine 0.61 - 1.24 mg/dL 1.15  Sodium 135 - 145 mmol/L 135  Potassium 3.5 - 5.1 mmol/L 4.0  Chloride 101 - 111 mmol/L 104  CO2 22 - 32 mmol/L 22  Calcium 8.9 - 10.3 mg/dL 8.7(L)  Total Protein 6.5 - 8.1 g/dL 6.5  Total Bilirubin 0.3 - 1.2 mg/dL 0.4  Alkaline Phos 38 - 126 U/L 36(L)  AST 15 - 41 U/L 32  ALT 17 - 63 U/L 24   CBC Latest Ref Rng & Units 07/18/2017  WBC 3.8 - 10.6 K/uL 1.8(L)  Hemoglobin 13.0 - 18.0 g/dL 9.5(L)  Hematocrit 40.0 - 52.0 % 27.5(L)  Platelets 150 - 440 K/uL 185     Assessment and plan- Patient is a 84 y.o. male with stage IV esophageal adenocarcinomacTX N0M1with metastases to the para-aortic lymph nodeherefor on treatment assessment prior to cycle # 6 of weeklycarbo/Taxol along with radiation  Wbc 1.8 with anc of 1.1. Will hold chemo today. He completes RT on 07/24/17. I discussed that given that he has Stage IV disease, we could consider proceeding with palliative FOLFOX chemotherapy for a few cycles atleast. We will re scan him 2 months from now. If he tolerates FOLFOX well, we could continue it, discontinue oxaliplatin or decide to give him treatment break for toxicity down the line.  FOLFOX will be given IV every 2 weeks.  He will have to come on day 3 for pump disconnect.  Discussed risks and benefits of FOLFOX including all but not limited  to nausea, vomiting, low blood counts, risk of infections and hospitalization.  Risk of peripheral neuropathy associated with oxaliplatin.  Patient understands and agrees to proceed  Given that his tumor was her 2 positive- I would also recommend herceptin to be given with FOLFOX Q2 weeks until progression.  Discussed risks and benefits of Herceptin including all but not limited to skin rash diarrhea and heart failure.  Patient understands and agrees to proceed typically heart failure with Herceptin is reversible on holding the dose.  I will obtain a baseline MUGA scan at this time  I will see him back in 2 weeks time with a CBC and CMP and if his counts have recovered I will proceed with FOLFOX chemotherapy plus minus Neulasta and herceptin   Visit Diagnosis 1. Malignant neoplasm of esophagus, unspecified location ([place])   2. Goals of care, counseling/discussion      Dr. Randa Evens, MD, MPH [facility] Pager- [phone] 07/18/2017 9:54 AM

## 2017-07-18 NOTE — Progress Notes (Signed)
No new changes noted todayt 

## 2017-07-18 NOTE — Progress Notes (Signed)
START ON PATHWAY REGIMEN - Gastroesophageal     Cycles 1 through 8: every 14 days:     Oxaliplatin      Leucovorin      5-Fluorouracil      5-Fluorouracil      Trastuzumab      Trastuzumab    Cycles 9 and beyond: every 21 days :     Trastuzumab   **Always confirm dose/schedule in your pharmacy ordering system**    Patient Characteristics: Distant Metastases (cM1/pM1) / Locally Recurrent Disease, Adenocarcinoma - Esophageal, GE Junction, and Gastric, First Line, HER2 Positive Histology: Adenocarcinoma Disease Classification: GE Junction Therapeutic Status: Distant Metastases (No Additional Staging) Line of Therapy: First Line HER2 Status: Positive Intent of Therapy: Non-Curative / Palliative Intent, Discussed with Patient

## 2017-07-19 ENCOUNTER — Ambulatory Visit
Admission: RE | Admit: 2017-07-19 | Discharge: 2017-07-19 | Disposition: A | Discharge status: Home / Self Care | Payer: Medicare Other | Source: Ambulatory Visit | Attending: Radiation Oncology | Admitting: Radiation Oncology

## 2017-07-19 DIAGNOSIS — Z510 Encounter for antineoplastic radiation therapy: Secondary | ICD-10-CM | POA: Diagnosis not present

## 2017-07-22 ENCOUNTER — Ambulatory Visit
Admission: RE | Admit: 2017-07-22 | Discharge: 2017-07-22 | Disposition: A | Discharge status: Home / Self Care | Payer: Medicare Other | Source: Ambulatory Visit | Attending: Radiation Oncology | Admitting: Radiation Oncology

## 2017-07-22 DIAGNOSIS — C155 Malignant neoplasm of lower third of esophagus: Secondary | ICD-10-CM | POA: Insufficient documentation

## 2017-07-22 DIAGNOSIS — Z510 Encounter for antineoplastic radiation therapy: Secondary | ICD-10-CM | POA: Diagnosis present

## 2017-07-23 ENCOUNTER — Ambulatory Visit
Admission: RE | Admit: 2017-07-23 | Discharge: 2017-07-23 | Disposition: A | Discharge status: Home / Self Care | Payer: Medicare Other | Source: Ambulatory Visit | Attending: Radiation Oncology | Admitting: Radiation Oncology

## 2017-07-23 DIAGNOSIS — Z510 Encounter for antineoplastic radiation therapy: Secondary | ICD-10-CM | POA: Diagnosis not present

## 2017-07-24 ENCOUNTER — Telehealth: Payer: Self-pay | Admitting: *Deleted

## 2017-07-24 ENCOUNTER — Ambulatory Visit
Admission: RE | Admit: 2017-07-24 | Discharge: 2017-07-24 | Disposition: A | Discharge status: Home / Self Care | Payer: Medicare Other | Source: Ambulatory Visit | Attending: Radiation Oncology | Admitting: Radiation Oncology

## 2017-07-24 DIAGNOSIS — Z510 Encounter for antineoplastic radiation therapy: Secondary | ICD-10-CM | POA: Diagnosis not present

## 2017-07-24 NOTE — Telephone Encounter (Signed)
Pt came up today after radiation. He states that his wife heard that he had a choice about starting chemo and did not have to start now. He says with his swallowing problems and fatigue he would like to hold off on starting new treatment next week. He states he was told that he would have to wait several week to have pet scan due to radiation that he has had and would like to wait for the scan to see if the chemo and radiation did any good and then make a decision about moving forward with next treatment. He would like me to cancel the MUGA scan and the appt for next week for the chemo. I told him that I would do that and let Dr. Janese Banks know about the plan that pt would like and let him know what Janese Banks says about the plan he wants to do. He does not want to do anything that would do harm to him.

## 2017-07-25 ENCOUNTER — Telehealth: Payer: Self-pay | Admitting: Oncology

## 2017-07-25 ENCOUNTER — Telehealth: Payer: Self-pay | Admitting: *Deleted

## 2017-07-25 DIAGNOSIS — C159 Malignant neoplasm of esophagus, unspecified: Secondary | ICD-10-CM

## 2017-07-25 NOTE — Telephone Encounter (Deleted)
[facility], per Dr Rao/schd Eddie Castaneda. Date/time per patient preference.  Appt conf with patient.  PET appt and instructions, also conf with patient for 09/04/17. MF

## 2017-07-25 NOTE — Telephone Encounter (Signed)
I am ok with that. Let us order repeat pet ct week of may 15th. I will see him 2 weeks from now with cbc with diff and cmp and see how he is doing.

## 2017-07-25 NOTE — Telephone Encounter (Signed)
[facility], per Dr Rao/schd Theora Gianotti.  Date/time on 08/06/17, per patient request.  Appts schd and  conf with patient.  PET appt and instructions, also conf with patient for 09/04/17.

## 2017-07-25 NOTE — Telephone Encounter (Signed)
Called pt and let him know that dr. Janese Banks did agree with him about not starting new folfox regimen now. Would like to get scan the week of may 15.  Dr. Janese Banks would also like to see pt in 2 weeks and I have made the appt with pt wanting afternoons 4/18 1:45 labs and see md 2:15. He is agreeable with this When he comes in 2 weeks we will give him the pet scan appt then.

## 2017-07-25 NOTE — Telephone Encounter (Signed)
I have called pt and I put telephone note in. Labs and see md in 2 weeks and schedule his pet for 5/15

## 2017-07-30 ENCOUNTER — Ambulatory Visit: Payer: Medicare Other

## 2017-07-30 ENCOUNTER — Ambulatory Visit: Payer: Medicare Other | Admitting: Oncology

## 2017-07-30 ENCOUNTER — Other Ambulatory Visit: Payer: Medicare Other

## 2017-08-06 ENCOUNTER — Inpatient Hospital Stay: Payer: Medicare Other | Attending: Oncology

## 2017-08-06 ENCOUNTER — Inpatient Hospital Stay (HOSPITAL_BASED_OUTPATIENT_CLINIC_OR_DEPARTMENT_OTHER): Payer: Medicare Other | Admitting: Oncology

## 2017-08-06 ENCOUNTER — Encounter: Payer: Self-pay | Admitting: Oncology

## 2017-08-06 VITALS — BP 150/92 | HR 88 | Temp 97.6°F | Resp 18 | Ht 71.0 in | Wt 177.2 lb

## 2017-08-06 DIAGNOSIS — R1310 Dysphagia, unspecified: Secondary | ICD-10-CM

## 2017-08-06 DIAGNOSIS — C159 Malignant neoplasm of esophagus, unspecified: Secondary | ICD-10-CM

## 2017-08-06 DIAGNOSIS — C155 Malignant neoplasm of lower third of esophagus: Secondary | ICD-10-CM | POA: Insufficient documentation

## 2017-08-06 DIAGNOSIS — D6481 Anemia due to antineoplastic chemotherapy: Secondary | ICD-10-CM

## 2017-08-06 LAB — COMPREHENSIVE METABOLIC PANEL
ALK PHOS: 43 U/L (ref 38–126)
ALT: 22 U/L (ref 17–63)
ANION GAP: 9 (ref 5–15)
AST: 28 U/L (ref 15–41)
Albumin: 3.7 g/dL (ref 3.5–5.0)
BUN: 24 mg/dL — ABNORMAL HIGH (ref 6–20)
CALCIUM: 9.3 mg/dL (ref 8.9–10.3)
CO2: 25 mmol/L (ref 22–32)
CREATININE: 1.04 mg/dL (ref 0.61–1.24)
Chloride: 103 mmol/L (ref 101–111)
Glucose, Bld: 108 mg/dL — ABNORMAL HIGH (ref 65–99)
Potassium: 4.4 mmol/L (ref 3.5–5.1)
SODIUM: 137 mmol/L (ref 135–145)
Total Bilirubin: 0.5 mg/dL (ref 0.3–1.2)
Total Protein: 7.2 g/dL (ref 6.5–8.1)

## 2017-08-06 LAB — CBC WITH DIFFERENTIAL/PLATELET
BASOS ABS: 0 10*3/uL (ref 0–0.1)
BASOS PCT: 0 %
EOS ABS: 0 10*3/uL (ref 0–0.7)
Eosinophils Relative: 0 %
HEMATOCRIT: 31.3 % — AB (ref 40.0–52.0)
HEMOGLOBIN: 10.8 g/dL — AB (ref 13.0–18.0)
Lymphocytes Relative: 11 %
Lymphs Abs: 0.8 10*3/uL — ABNORMAL LOW (ref 1.0–3.6)
MCH: 34.1 pg — ABNORMAL HIGH (ref 26.0–34.0)
MCHC: 34.4 g/dL (ref 32.0–36.0)
MCV: 99.1 fL (ref 80.0–100.0)
Monocytes Absolute: 1.1 10*3/uL — ABNORMAL HIGH (ref 0.2–1.0)
Monocytes Relative: 15 %
NEUTROS ABS: 5.3 10*3/uL (ref 1.4–6.5)
NEUTROS PCT: 74 %
Platelets: 269 10*3/uL (ref 150–440)
RBC: 3.16 MIL/uL — AB (ref 4.40–5.90)
RDW: 15.4 % — ABNORMAL HIGH (ref 11.5–14.5)
WBC: 7.3 10*3/uL (ref 3.8–10.6)

## 2017-08-09 NOTE — Progress Notes (Signed)
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 He also has a history of multiple back surgeries and chronic back pain but is otherwise healthy and lives with his wife. He is independent of his ADLs. He was seen by [facility] GI in August 2018 for symptoms of epigastric pain and some difficulty swallowing. He denies any change in his appetite or unintentional weight loss. He reports dysphagia to both solids and liquids.   Patient had a barium swallow in September 2018 which showed moderate sized reducible hiatal hernia without evidence of stricture or reflux or esophagitis. More proximally no stricture or spasm was observed. No laryngeal penetration of the barium  He continued to have symptoms and then underwent an upper endoscopy in January 2019 which showed a medium-sized fungating mass with no bleeding found in the distal esophagus 35-37 cm from the incisors. Mass was nonobstructing, partially obstructing and not circumferential. 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 Angiolymphatic invasion was present. Fundic gland polyp was noted in the stomach which was negative for H. pylori dysplasia and malignancy.  Patient underwent PET/CT scan on 05/28/2017 which showed small hypermetabolic mass in the  distal esophagus with an SUV of 10.7. Hypermetabolic left periaortic adenopathy compatible with malignancy. Larger of the 2 involved lymph nodes with a maximum SUV of 16.1. No other regions of tumor are identified.  Tumor marker testing shows he is positive for HER-2 and PDL 1 expressed  Biopsy of the left para-aortic lymph node positive for metastatic esophageal cancer  Patient completed concurrent chemo/RT on 07/24/17. He did miss his last dose of chemotherapy due to neutropenia  Interval history- he still reports some difficulty swallowing but able to eat most foods. Nose bleeds have stopped. Energy levels have improved.   ECOG PS- 1 Pain scale- 0   Review of systems- Review of Systems  Constitutional: Negative for chills, fever, malaise/fatigue and weight loss.  HENT: Negative for congestion, ear discharge and nosebleeds.   Eyes: Negative for blurred vision.  Respiratory: Negative for cough, hemoptysis, sputum production, shortness of breath and wheezing.   Cardiovascular: Negative for chest pain, palpitations, orthopnea and claudication.  Gastrointestinal: Negative for abdominal pain, blood in stool, constipation, diarrhea, heartburn, melena, nausea and vomiting.       Difficulty swallowing  Genitourinary: Negative for dysuria, flank pain, frequency, hematuria and urgency.  Musculoskeletal: Negative for back pain, joint pain and myalgias.  Skin: Negative for rash.  Neurological: Negative for dizziness, tingling, focal weakness, seizures, weakness and headaches.  Endo/Heme/Allergies: Does not bruise/bleed easily.  Psychiatric/Behavioral: Negative for depression and suicidal ideas. The patient does not have insomnia.       Allergies  Allergen Reactions  . Morphine And Related Nausea Only     Past Medical History:  Diagnosis Date  . Anemia   . Arthritis   . Coronary artery disease   . Diverticulitis   . Dysrhythmia   .  GERD (gastroesophageal reflux disease)   .  Heart murmur   . History of hiatal hernia   . Hyperglycemia   . Hyperlipidemia   . Hypertension   . Hypothyroidism   . Spinal stenosis      Past Surgical History:  Procedure Laterality Date  . BACK SURGERY    . CATARACT EXTRACTION W/PHACO Left 11/08/2014   Procedure: CATARACT EXTRACTION PHACO AND INTRAOCULAR LENS PLACEMENT (IOC);  Surgeon: Estill Cotta, MD;  Location: [facility] ORS;  Service: Ophthalmology;  Laterality: Left;  US:01:20 AP:21.8 CDE:31.09 PAC LOT: [id number]  . CHOLECYSTECTOMY    . ESOPHAGOGASTRODUODENOSCOPY (EGD) WITH PROPOFOL N/A 05/23/2017   Procedure: ESOPHAGOGASTRODUODENOSCOPY (EGD) WITH PROPOFOL;  Surgeon: Lollie Sails, MD;  Location: [facility] ENDOSCOPY;  Service: Endoscopy;  Laterality: N/A;  . EYE SURGERY    . IR FLUORO GUIDE PORT INSERTION RIGHT  06/07/2017  . JOINT REPLACEMENT Right    Knee  . ROTATOR CUFF REPAIR Right     Social History   Socioeconomic History  . Marital status: Married    Spouse name: Not on file  . Number of children: Not on file  . Years of education: Not on file  . Highest education level: Not on file  Occupational History  . Not on file  Social Needs  . Financial resource strain: Not on file  . Food insecurity:    Worry: Not on file    Inability: Not on file  . Transportation needs:    Medical: Not on file    Non-medical: Not on file  Tobacco Use  . Smoking status: Former Smoker    Last attempt to quit: 03/24/1989    Years since quitting: 28.3  . Smokeless tobacco: Never Used  Substance and Sexual Activity  . Alcohol use: No    Frequency: Never  . Drug use: No  . Sexual activity: Not on file  Lifestyle  . Physical activity:    Days per week: Not on file    Minutes per session: Not on file  . Stress: Not on file  Relationships  . Social connections:    Talks on phone: Not on file    Gets together: Not on file    Attends religious service: Not on file    Active member of club or organization: Not on file     Attends meetings of clubs or organizations: Not on file    Relationship status: Not on file  . Intimate partner violence:    Fear of current or ex partner: Not on file    Emotionally abused: Not on file    Physically abused: Not on file    Forced sexual activity: Not on file  Other Topics Concern  . Not on file  Social History Narrative  . Not on file    Family History  Problem Relation Age of Onset  . Colon cancer Mother      Current Outpatient Medications:  .  acetaminophen (TYLENOL) 650 MG CR tablet, Take 650 mg by mouth every 8 (eight) hours as needed for pain., Disp: , Rfl:  .  ascorbic acid (VITAMIN C) 1000 MG tablet, Take 1,000 mg by mouth daily., Disp: , Rfl:  .  atorvastatin (LIPITOR) 10 MG tablet, Take 10 mg by mouth daily., Disp: , Rfl:  .  BACILLUS COAGULANS-INULIN PO, Take by mouth., Disp: , Rfl:  .  cholecalciferol (VITAMIN D) 1000 units tablet, Take 1,000 Units by mouth daily., Disp: , Rfl:  .  co-enzyme Q-10 30 MG capsule,  Take 30 mg by mouth daily., Disp: , Rfl:  .  docusate sodium (COLACE) 100 MG capsule, Take 100 mg by mouth daily., Disp: , Rfl:  .  HYDROcodone-acetaminophen (NORCO) 5-325 MG tablet, Take 1 tablet by mouth every 6 (six) hours as needed for moderate pain., Disp: 20 tablet, Rfl: 0 .  levothyroxine (SYNTHROID, LEVOTHROID) 100 MCG tablet, Take 25 mcg by mouth daily before breakfast., Disp: , Rfl:  .  levothyroxine (SYNTHROID, LEVOTHROID) 25 MCG tablet, Take 25 mcg by mouth daily before breakfast., Disp: , Rfl:  .  metoprolol tartrate (LOPRESSOR) 25 MG tablet, Take 25 mg by mouth daily., Disp: , Rfl:  .  Multiple Vitamin (MULTIVITAMIN) tablet, Take 1 tablet by mouth daily., Disp: , Rfl:  .  Omega-3 1000 MG CAPS, Take 340-1,000 capsules by mouth 2 (two) times daily., Disp: , Rfl:  .  omeprazole (PRILOSEC) 20 MG capsule, Take 20 mg by mouth daily., Disp: , Rfl:  .  pravastatin (PRAVACHOL) 20 MG tablet, Take 20 mg by mouth daily., Disp: , Rfl:  .   predniSONE (DELTASONE) 5 MG tablet, Take 5 mg by mouth daily with breakfast., Disp: , Rfl:  .  Probiotic Product (PROBIOTIC COMPLEX ACIDOPHILUS PO), Take by mouth., Disp: , Rfl:  .  quinapril (ACCUPRIL) 40 MG tablet, Take 40 mg by mouth 1 day or 1 dose., Disp: , Rfl:  .  sucralfate (CARAFATE) 1 g tablet, Take 1 tablet (1 g total) by mouth 3 (three) times daily. Dissolve in 2-3 tbsp warm water, swish and swallow., Disp: 90 tablet, Rfl: 3 .  tamsulosin (FLOMAX) 0.4 MG CAPS capsule, Take 0.4 mg by mouth daily., Disp: , Rfl:  .  traMADol (ULTRAM-ER) 100 MG 24 hr tablet, Take 50 mg by mouth 2 (two) times daily. With lunch and dinner , Disp: , Rfl:  .  traMADol (ULTRAM-ER) 300 MG 24 hr tablet, Take 300 mg by mouth daily. AM dose, Disp: , Rfl:  .  vitamin B-12 (CYANOCOBALAMIN) 1000 MCG tablet, Take 1,500 mcg by mouth daily. , Disp: , Rfl:   Physical exam:  Vitals:   08/06/17 1433  BP: (!) 150/92  Pulse: 88  Resp: 18  Temp: 97.6 F (36.4 C)  TempSrc: Tympanic  SpO2: 97%  Weight: 177 lb 3.2 oz (80.4 kg)  Height: '5\' 11"'  (1.803 m)   Physical Exam  Constitutional: He is oriented to person, place, and time. He appears well-developed and well-nourished.  HENT:  Head: Normocephalic and atraumatic.  Eyes: Pupils are equal, round, and reactive to light. EOM are normal.  Neck: Normal range of motion.  Cardiovascular: Normal rate, regular rhythm and normal heart sounds.  Pulmonary/Chest: Effort normal and breath sounds normal.  Abdominal: Soft. Bowel sounds are normal.  Neurological: He is alert and oriented to person, place, and time.  Skin: Skin is warm and dry.     CMP Latest Ref Rng & Units 08/06/2017  Glucose 65 - 99 mg/dL 108(H)  BUN 6 - 20 mg/dL 24(H)  Creatinine 0.61 - 1.24 mg/dL 1.04  Sodium 135 - 145 mmol/L 137  Potassium 3.5 - 5.1 mmol/L 4.4  Chloride 101 - 111 mmol/L 103  CO2 22 - 32 mmol/L 25  Calcium 8.9 - 10.3 mg/dL 9.3  Total Protein 6.5 - 8.1 g/dL 7.2  Total Bilirubin 0.3 -  1.2 mg/dL 0.5  Alkaline Phos 38 - 126 U/L 43  AST 15 - 41 U/L 28  ALT 17 - 63 U/L 22   CBC Latest Ref  Rng & Units 08/06/2017  WBC 3.8 - 10.6 K/uL 7.3  Hemoglobin 13.0 - 18.0 g/dL 10.8(L)  Hematocrit 40.0 - 52.0 % 31.3(L)  Platelets 150 - 440 K/uL 269      Assessment and plan- Patient is a 84 y.o. male with stage IV esophageal adenocarcinomacTX N0M1with metastases to the para-aortic lymph nodes/p concurrent chemo/RT with carbo/taxol  Patient has completed chemo/Rt. He understands that this would not be the standard of care treatment in Stage IV esophageal cancer. However his only site of stage IV disease was the paraaortic LN which is a non regional LN.   He has a repeat PET/CT planned on 09/04/17. I will have Dr. Mike Gip review his PET images at tumor board as I am away on vacation  If PET shows any evidence of metastatic/ residual disease- he is agreeable to starting palliative FOLFOX chemotherapy with herceptin.   We had discussed proceed with palliative FOLFOX following chemo/RT regardless of PET results given Stage IV disease which the patient does not wish to opt at this time given his age and concern for side effects. He is unsure how he would like to proceed if PET shows no active disease.  He has mild chemo induced anemia but wbc and platelet counts are normal today  Patient will meet Dr. Mike Gip after PET CT t discuss scan results in my absence. He will have CBC/ CMP and CEA checked through port      Visit Diagnosis 1. Malignant neoplasm of esophagus, unspecified location [facility])      Dr. Randa Evens, MD, MPH [facility] [phone] 08/09/2017 8:42 AM

## 2017-08-20 ENCOUNTER — Ambulatory Visit: Payer: Medicare Other

## 2017-08-20 ENCOUNTER — Inpatient Hospital Stay: Payer: Medicare Other | Admitting: Oncology

## 2017-08-20 ENCOUNTER — Other Ambulatory Visit: Payer: Medicare Other

## 2017-08-23 ENCOUNTER — Ambulatory Visit: Payer: Medicare Other | Admitting: Radiation Oncology

## 2017-08-26 ENCOUNTER — Ambulatory Visit
Admission: RE | Admit: 2017-08-26 | Discharge: 2017-08-26 | Disposition: A | Discharge status: Home / Self Care | Payer: Medicare Other | Source: Ambulatory Visit | Attending: Radiation Oncology | Admitting: Radiation Oncology

## 2017-08-26 ENCOUNTER — Encounter: Payer: Self-pay | Admitting: Radiation Oncology

## 2017-08-26 ENCOUNTER — Other Ambulatory Visit: Payer: Self-pay

## 2017-08-26 VITALS — BP 133/84 | HR 85 | Temp 95.7°F | Resp 18 | Wt 176.8 lb

## 2017-08-26 DIAGNOSIS — Z9221 Personal history of antineoplastic chemotherapy: Secondary | ICD-10-CM | POA: Insufficient documentation

## 2017-08-26 DIAGNOSIS — C155 Malignant neoplasm of lower third of esophagus: Secondary | ICD-10-CM | POA: Insufficient documentation

## 2017-08-26 DIAGNOSIS — Z923 Personal history of irradiation: Secondary | ICD-10-CM | POA: Diagnosis not present

## 2017-08-26 NOTE — Progress Notes (Signed)
Radiation Oncology Follow up Note  Name: Eddie Castaneda   Date:   08/26/2017 MRN:  [id number] DOB: May 20, 1933    This 84 y.o. male presents to the clinic today for one-month follow-up status post concurrent chemoradiation therapy for probable stage IV adenocarcinoma the distal esophagus.  REFERRING PROVIDER: Adin Hector, MD  HPI: Eddie Castaneda is an 84 year old male now out 1 month having completed concurrent chemoradiation for probable stage IV adenocarcinoma the distal esophagus..he was stage IV by hypermetabolic activity in periaortic lymph nodes. He is seen today in routine follow-up and is doing well he states he's having no dysphagia. He states he feels well weight is stable no cough chest tightness.he has a PET CT scan planned for next week.  COMPLICATIONS OF TREATMENT: none  FOLLOW UP COMPLIANCE: keeps appointments   PHYSICAL EXAM:  BP 133/84   Pulse 85   Temp (!) 95.7 F (35.4 C)   Resp 18   Wt 176 lb 12.9 oz (80.2 kg)   BMI 24.66 kg/m  Well-developed well-nourished patient in NAD. HEENT reveals PERLA, EOMI, discs not visualized.  Oral cavity is clear. No oral mucosal lesions are identified. Neck is clear without evidence of cervical or supraclavicular adenopathy. Lungs are clear to A&P. Cardiac examination is essentially unremarkable with regular rate and rhythm without murmur rub or thrill. Abdomen is benign with no organomegaly or masses noted. Motor sensory and DTR levels are equal and symmetric in the upper and lower extremities. Cranial nerves II through XII are grossly intact. Proprioception is intact. No peripheral adenopathy or edema is identified. No motor or sensory levels are noted. Crude visual fields are within normal range.  RADIOLOGY RESULTS: PET scan will be reviewed when available  PLAN: present time he is doing well. No evidence of dysphasia or weight loss. He will of PET CT scan next week and that will determine whether he may proceed with FOLFOX  chemotherapy. I have asked to see him back in 3-4 months for follow-up. Patient is to call anytime with any concerns.  I would like to take this opportunity to thank you for allowing me to participate in the care of your patient.Noreene Filbert, MD

## 2017-09-03 ENCOUNTER — Inpatient Hospital Stay: Payer: Medicare Other | Attending: Hematology and Oncology

## 2017-09-03 DIAGNOSIS — Z452 Encounter for adjustment and management of vascular access device: Secondary | ICD-10-CM | POA: Insufficient documentation

## 2017-09-03 DIAGNOSIS — D701 Agranulocytosis secondary to cancer chemotherapy: Secondary | ICD-10-CM | POA: Diagnosis not present

## 2017-09-03 DIAGNOSIS — C155 Malignant neoplasm of lower third of esophagus: Secondary | ICD-10-CM | POA: Diagnosis not present

## 2017-09-03 DIAGNOSIS — C7989 Secondary malignant neoplasm of other specified sites: Secondary | ICD-10-CM | POA: Insufficient documentation

## 2017-09-03 DIAGNOSIS — C159 Malignant neoplasm of esophagus, unspecified: Secondary | ICD-10-CM

## 2017-09-03 LAB — CBC WITH DIFFERENTIAL/PLATELET
Basophils Absolute: 0 10*3/uL (ref 0–0.1)
Basophils Relative: 0 %
Eosinophils Absolute: 0.1 10*3/uL (ref 0–0.7)
Eosinophils Relative: 1 %
HEMATOCRIT: 30.9 % — AB (ref 40.0–52.0)
HEMOGLOBIN: 10.6 g/dL — AB (ref 13.0–18.0)
LYMPHS ABS: 0.8 10*3/uL — AB (ref 1.0–3.6)
LYMPHS PCT: 12 %
MCH: 34.6 pg — ABNORMAL HIGH (ref 26.0–34.0)
MCHC: 34.3 g/dL (ref 32.0–36.0)
MCV: 100.8 fL — AB (ref 80.0–100.0)
Monocytes Absolute: 0.7 10*3/uL (ref 0.2–1.0)
Monocytes Relative: 11 %
NEUTROS PCT: 76 %
Neutro Abs: 5.1 10*3/uL (ref 1.4–6.5)
PLATELETS: 174 10*3/uL (ref 150–440)
RBC: 3.06 MIL/uL — AB (ref 4.40–5.90)
RDW: 16.5 % — ABNORMAL HIGH (ref 11.5–14.5)
WBC: 6.7 10*3/uL (ref 3.8–10.6)

## 2017-09-03 LAB — COMPREHENSIVE METABOLIC PANEL
ALT: 23 U/L (ref 17–63)
AST: 25 U/L (ref 15–41)
Albumin: 3.8 g/dL (ref 3.5–5.0)
Alkaline Phosphatase: 36 U/L — ABNORMAL LOW (ref 38–126)
Anion gap: 8 (ref 5–15)
BUN: 24 mg/dL — AB (ref 6–20)
CHLORIDE: 104 mmol/L (ref 101–111)
CO2: 25 mmol/L (ref 22–32)
Calcium: 9.2 mg/dL (ref 8.9–10.3)
Creatinine, Ser: 0.98 mg/dL (ref 0.61–1.24)
GFR calc non Af Amer: >60 mL/min (ref >60)
Glucose, Bld: 103 mg/dL — ABNORMAL HIGH (ref 65–99)
POTASSIUM: 4.3 mmol/L (ref 3.5–5.1)
SODIUM: 137 mmol/L (ref 135–145)
Total Bilirubin: 0.6 mg/dL (ref 0.3–1.2)
Total Protein: 6.8 g/dL (ref 6.5–8.1)

## 2017-09-03 MED ORDER — SODIUM CHLORIDE 0.9% FLUSH
10.0000 mL | Freq: Once | INTRAVENOUS | Status: AC
  Administered 2017-09-03: 10 mL via INTRAVENOUS
  Filled 2017-09-03: qty 10

## 2017-09-03 MED ORDER — HEPARIN SOD (PORK) LOCK FLUSH 100 UNIT/ML IV SOLN
500.0000 [IU] | Freq: Once | INTRAVENOUS | Status: AC
  Administered 2017-09-03: 500 [IU] via INTRAVENOUS

## 2017-09-04 ENCOUNTER — Encounter
Admission: RE | Admit: 2017-09-04 | Discharge: 2017-09-04 | Disposition: A | Discharge status: Home / Self Care | Payer: Medicare Other | Source: Ambulatory Visit | Attending: Oncology | Admitting: Oncology

## 2017-09-04 DIAGNOSIS — C159 Malignant neoplasm of esophagus, unspecified: Secondary | ICD-10-CM | POA: Insufficient documentation

## 2017-09-04 LAB — GLUCOSE, CAPILLARY: GLUCOSE-CAPILLARY: 92 mg/dL (ref 65–99)

## 2017-09-04 LAB — CEA: CEA1: 3.5 ng/mL (ref 0.0–4.7)

## 2017-09-04 MED ORDER — FLUDEOXYGLUCOSE F - 18 (FDG) INJECTION
9.4900 | Freq: Once | INTRAVENOUS | Status: AC | PRN
  Administered 2017-09-04: 9.49 via INTRAVENOUS

## 2017-09-09 ENCOUNTER — Other Ambulatory Visit: Payer: Self-pay | Admitting: Radiation Oncology

## 2017-09-10 ENCOUNTER — Other Ambulatory Visit: Payer: Self-pay

## 2017-09-10 ENCOUNTER — Encounter: Payer: Self-pay | Admitting: Hematology and Oncology

## 2017-09-10 ENCOUNTER — Inpatient Hospital Stay (HOSPITAL_BASED_OUTPATIENT_CLINIC_OR_DEPARTMENT_OTHER): Payer: Medicare Other | Admitting: Hematology and Oncology

## 2017-09-10 VITALS — BP 121/70 | HR 77 | Temp 99.1°F | Resp 18 | Wt 180.0 lb

## 2017-09-10 DIAGNOSIS — C7989 Secondary malignant neoplasm of other specified sites: Secondary | ICD-10-CM

## 2017-09-10 DIAGNOSIS — D701 Agranulocytosis secondary to cancer chemotherapy: Secondary | ICD-10-CM | POA: Diagnosis not present

## 2017-09-10 DIAGNOSIS — C155 Malignant neoplasm of lower third of esophagus: Secondary | ICD-10-CM | POA: Diagnosis not present

## 2017-09-10 DIAGNOSIS — Z7189 Other specified counseling: Secondary | ICD-10-CM

## 2017-09-10 NOTE — Progress Notes (Signed)
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 He also has a history of multiple back surgeries and chronic back pain but is otherwise healthy and lives with his wife. He is independent of his ADLs. He was seen by [facility] GI in August 2018 for symptoms of epigastric pain and some difficulty swallowing. He denies any change in his appetite or unintentional weight loss. He reports dysphagia to both solids and liquids.   Patient had a barium swallow in September 2018 which showed moderate sized reducible hiatal hernia without evidence of stricture or reflux or esophagitis. More proximally no stricture or spasm was observed. No laryngeal penetration of the barium  He continued to have symptoms and then underwent an upper endoscopy in January 2019 which showed a medium-sized fungating mass with no bleeding found in the distal esophagus 35-37 cm from the incisors. Mass was nonobstructing, partially obstructing and not circumferential. 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 Angiolymphatic invasion was present. Fundic gland polyp was noted in the stomach which was negative for H. pylori dysplasia and malignancy.  Patient underwent PET/CT scan on 05/28/2017 which showed small hypermetabolic mass in  the distal esophagus with an SUV of 10.7. Hypermetabolic left periaortic adenopathy compatible with malignancy. Larger of the 2 involved lymph nodes with a maximum SUV of 16.1. No other regions of tumor are identified.  Tumor testing showed he is positive for HER-2 and PDL 1 is expressed  Biopsy of the left para-aortic lymph node was positive for metastatic esophageal cancer  Patient completed concurrent chemo/RT on 07/24/2017. He did miss his last dose of chemotherapy due to neutropenia.  Interval history- he still reports some difficulty swallowing but able to eat most foods. Nose bleeds have stopped. Energy levels have improved.   He was last seen in the medical oncology clinic by Dr. Janese Banks on 08/06/2017.  He reported some difficulty swallowing, but was able to eat most foods.  Energy level had improved.  Discussions were held regarding possible Herceptin + FOLFOX if follow-up imaging revealed active disease.  He saw Dr. Baruch Gouty on 08/26/2017.  He denied dysphagia or weight loss.  PET scan on 09/04/2017 revealed a positive response to therapy with regression of the primary lesion (SUV 10.7 to 3.5) in the distal esophagus and decreased hypermetabolism in retroperitoneal lymph nodes  (SUV 10.5 to 4.6; SUV 16.1 to 11.9) which were essentially unchanged in size.  There was a new focus of hypermetabolism (SUV 7.4) in the proximal stomach just distal to the gastroesophageal junction. This was of uncertain etiology and significance, and could be physiologic, but correlation with endoscopy may be considered.  Symptomatically, patient is doing well overall. His energy has returned to baseline. Patient denies any acute concerns today. Patient is eating better. His weight has increased by 3 pounds. Patient denies B symptoms and interval infections. Patient is on chronic corticosteroid therapy. He is followed  by Dr. Meda Coffee. Patient takes oral prednisone 10 mg for "an elevated sed rate" secondary to  polymyalgia rheumatica.    ECOG PS- 1 Pain scale- 0   Review of systems- Review of Systems  Constitutional: Negative for chills, fever, malaise/fatigue and weight loss.       Weight gain of 3 pounds.  HENT: Negative for congestion, ear discharge and nosebleeds.   Eyes: Negative for blurred vision.  Respiratory: Negative for cough, hemoptysis, sputum production, shortness of breath and wheezing.   Cardiovascular: Negative for chest pain, palpitations, orthopnea and claudication.  Gastrointestinal: Positive for heartburn (on PPI therapy). Negative for abdominal pain, blood in stool, constipation, diarrhea, melena, nausea and vomiting.       Difficulty swallowing  Genitourinary: Negative for dysuria, flank pain, frequency, hematuria and urgency.  Musculoskeletal: Negative for back pain, joint pain and myalgias.  Skin: Negative for rash.  Neurological: Negative for dizziness, tingling, focal weakness, seizures, weakness and headaches.  Endo/Heme/Allergies: Does not bruise/bleed easily.  Psychiatric/Behavioral: Negative for depression and suicidal ideas. The patient does not have insomnia.      Allergies  Allergen Reactions  . Morphine And Related Nausea Only     Past Medical History:  Diagnosis Date  . Anemia   . Arthritis   . Coronary artery disease   . Diverticulitis   . Dysrhythmia   . GERD (gastroesophageal reflux disease)   . Heart murmur   . History of hiatal hernia   . Hyperglycemia   . Hyperlipidemia   . Hypertension   . Hypothyroidism   . Spinal stenosis      Past Surgical History:  Procedure Laterality Date  . BACK SURGERY    . CATARACT EXTRACTION W/PHACO Left 11/08/2014   Procedure: CATARACT EXTRACTION PHACO AND INTRAOCULAR LENS PLACEMENT (IOC);  Surgeon: Estill Cotta, MD;  Location: [facility] ORS;  Service: Ophthalmology;  Laterality: Left;  US:01:20 AP:21.8 CDE:31.09 PAC LOT: [id number]  . CHOLECYSTECTOMY    . ESOPHAGOGASTRODUODENOSCOPY (EGD) WITH  PROPOFOL N/A 05/23/2017   Procedure: ESOPHAGOGASTRODUODENOSCOPY (EGD) WITH PROPOFOL;  Surgeon: Lollie Sails, MD;  Location: [facility] ENDOSCOPY;  Service: Endoscopy;  Laterality: N/A;  . EYE SURGERY    . IR FLUORO GUIDE PORT INSERTION RIGHT  06/07/2017  . JOINT REPLACEMENT Right    Knee  . ROTATOR CUFF REPAIR Right     Social History   Socioeconomic History  . Marital status: Married    Spouse name: Not on file  . Number of children: Not on file  . Years of education: Not on file  . Highest education level: Not on file  Occupational History  . Not on file  Social Needs  . Financial resource strain: Not on file  . Food insecurity:    Worry: Not on file    Inability: Not on file  . Transportation needs:    Medical: Not on file    Non-medical: Not on file  Tobacco Use  . Smoking status: Former Smoker    Last attempt to quit: 03/24/1989    Years since quitting: 28.4  . Smokeless tobacco: Never Used  Substance and Sexual Activity  . Alcohol use: No    Frequency: Never  . Drug use: No  . Sexual activity: Not on file  Lifestyle  . Physical activity:    Days per week: Not on file    Minutes per session: Not on file  . Stress: Not on file  Relationships  . Social connections:    Talks on phone: Not on  file    Gets together: Not on file    Attends religious service: Not on file    Active member of club or organization: Not on file    Attends meetings of clubs or organizations: Not on file    Relationship status: Not on file  . Intimate partner violence:    Fear of current or ex partner: Not on file    Emotionally abused: Not on file    Physically abused: Not on file    Forced sexual activity: Not on file  Other Topics Concern  . Not on file  Social History Narrative  . Not on file    Family History  Problem Relation Age of Onset  . Colon cancer Mother      Current Outpatient Medications:  .  acetaminophen (TYLENOL) 650 MG CR tablet, Take 650 mg by mouth every 8  (eight) hours as needed for pain., Disp: , Rfl:  .  ascorbic acid (VITAMIN C) 1000 MG tablet, Take 1,000 mg by mouth daily., Disp: , Rfl:  .  BACILLUS COAGULANS-INULIN PO, Take by mouth., Disp: , Rfl:  .  docusate sodium (COLACE) 100 MG capsule, Take 100 mg by mouth daily., Disp: , Rfl:  .  levothyroxine (SYNTHROID, LEVOTHROID) 25 MCG tablet, Take 25 mcg by mouth daily before breakfast., Disp: , Rfl:  .  metoprolol tartrate (LOPRESSOR) 25 MG tablet, Take 25 mg by mouth daily., Disp: , Rfl:  .  omeprazole (PRILOSEC) 20 MG capsule, Take 20 mg by mouth daily., Disp: , Rfl:  .  pravastatin (PRAVACHOL) 20 MG tablet, Take 20 mg by mouth daily., Disp: , Rfl:  .  predniSONE (DELTASONE) 5 MG tablet, Take 5 mg by mouth daily with breakfast., Disp: , Rfl:  .  Probiotic Product (PROBIOTIC COMPLEX ACIDOPHILUS PO), Take by mouth., Disp: , Rfl:  .  quinapril (ACCUPRIL) 40 MG tablet, Take 40 mg by mouth 1 day or 1 dose., Disp: , Rfl:  .  tamsulosin (FLOMAX) 0.4 MG CAPS capsule, Take 0.4 mg by mouth daily., Disp: , Rfl:  .  traMADol (ULTRAM-ER) 100 MG 24 hr tablet, Take 50 mg by mouth 2 (two) times daily. With lunch and dinner , Disp: , Rfl:  .  traMADol (ULTRAM-ER) 300 MG 24 hr tablet, Take 300 mg by mouth daily. AM dose, Disp: , Rfl:  .  vitamin B-12 (CYANOCOBALAMIN) 1000 MCG tablet, Take 1,500 mcg by mouth daily. , Disp: , Rfl:  .  atorvastatin (LIPITOR) 10 MG tablet, Take 10 mg by mouth daily., Disp: , Rfl:  .  cholecalciferol (VITAMIN D) 1000 units tablet, Take 1,000 Units by mouth daily., Disp: , Rfl:  .  co-enzyme Q-10 30 MG capsule, Take 30 mg by mouth daily., Disp: , Rfl:  .  HYDROcodone-acetaminophen (NORCO) 5-325 MG tablet, Take 1 tablet by mouth every 6 (six) hours as needed for moderate pain. (Patient not taking: Reported on 09/10/2017), Disp: 20 tablet, Rfl: 0 .  levothyroxine (SYNTHROID, LEVOTHROID) 100 MCG tablet, Take 25 mcg by mouth daily before breakfast., Disp: , Rfl:  .  Multiple Vitamin  (MULTIVITAMIN) tablet, Take 1 tablet by mouth daily., Disp: , Rfl:  .  Omega-3 1000 MG CAPS, Take 340-1,000 capsules by mouth 2 (two) times daily., Disp: , Rfl:  .  sucralfate (CARAFATE) 1 g tablet, Take 1 tablet (1 g total) by mouth 3 (three) times daily. Dissolve in 2-3 tbsp warm water, swish and swallow. (Patient not taking: Reported on 08/26/2017), Disp: 90 tablet, Rfl:  3  Physical exam:  Vitals:   09/10/17 1021  BP: 121/70  Pulse: 77  Resp: 18  Temp: 99.1 F (37.3 C)  Weight: 180 lb (81.6 kg)   Physical Exam  Constitutional: He is oriented to person, place, and time. He appears well-developed and well-nourished.  HENT:  Head: Normocephalic and atraumatic.  Short gray hair.  Eyes: Pupils are equal, round, and reactive to light. EOM are normal.  Neck: Normal range of motion. Neck supple. No tracheal deviation present.  Cardiovascular: Normal rate, regular rhythm and normal heart sounds.  Pulmonary/Chest: Effort normal and breath sounds normal. No respiratory distress. He has no wheezes. He has no rales.  Abdominal: Soft. Bowel sounds are normal. He exhibits no mass. There is no tenderness.  Lymphadenopathy:       Head (right side): No preauricular, no posterior auricular and no occipital adenopathy present.       Head (left side): No preauricular, no posterior auricular and no occipital adenopathy present.    He has no cervical adenopathy.    He has no axillary adenopathy.       Right: No inguinal and no supraclavicular adenopathy present.       Left: No inguinal and no supraclavicular adenopathy present.  Neurological: He is alert and oriented to person, place, and time.  Skin: Skin is warm and dry. He is not diaphoretic.  Psychiatric: He has a normal mood and affect. His behavior is normal.     CMP Latest Ref Rng & Units 09/03/2017  Glucose 65 - 99 mg/dL 103(H)  BUN 6 - 20 mg/dL 24(H)  Creatinine 0.61 - 1.24 mg/dL 0.98  Sodium 135 - 145 mmol/L 137  Potassium 3.5 - 5.1  mmol/L 4.3  Chloride 101 - 111 mmol/L 104  CO2 22 - 32 mmol/L 25  Calcium 8.9 - 10.3 mg/dL 9.2  Total Protein 6.5 - 8.1 g/dL 6.8  Total Bilirubin 0.3 - 1.2 mg/dL 0.6  Alkaline Phos 38 - 126 U/L 36(L)  AST 15 - 41 U/L 25  ALT 17 - 63 U/L 23   CBC Latest Ref Rng & Units 09/03/2017  WBC 3.8 - 10.6 K/uL 6.7  Hemoglobin 13.0 - 18.0 g/dL 10.6(L)  Hematocrit 40.0 - 52.0 % 30.9(L)  Platelets 150 - 440 K/uL 174      Assessment and plan- Patient is a 84 y.o. male with stage IV esophageal adenocarcinoma(TX N0M1) with metastases to the para-aortic lymph nodes/p concurrent chemo/RT with carbo/taxol.  CEA was 3.5 on 09/10/2017.   PET/CT scan on 05/28/2017 showed small hypermetabolic mass in the distal esophagus with an SUV of 10.7. There was hypermetabolic left periaortic adenopathy compatible with malignancy. Larger of the 2 involved lymph nodes with a maximum SUV of 16.1. No other regions of tumor are identified.  Symptomatically, his swallowing is "ok".  He is eating better and gaining weight.  Exam is unremarkable.  1. Labs today: CBC, CMP, CEA. 2. Review PET scan - new focus of hypermetabolism (SUV 7.4) in the proximal stomach just distal to the gastroesophageal junction 3. Review plans for treatment with palliative intent using FOLFOX + Herceptin with Neulasta support. Discussed side effects. Anticipate starting on 09/24/2017. 4. Preauthorize chemotherapy and Neulasta.  Patient wishes to postpone until Dr Janese Banks returns. 5. Reschedule MUGA scan. Patient aware that study has to be done prior to chemotherapy.  6. RTC on 09/24/2017 for MD assessment (Dr Janese Banks), labs (CBC with diff, CMP, Mg), and cycle #1 FOLFOX + Herceptin 7. RTC on  09/26/2017 for pump disconnect and Neulasta.   Visit Diagnosis 1. Malignant neoplasm of lower third of esophagus (HCC)   2. Goals of care, counseling/discussion     Honor Loh, NP 09/10/2017, 10:32 AM   I saw and evaluated the patient, participating in the  key portions of the service and reviewing pertinent diagnostic studies and records.  I reviewed the nurse practitioner's note and agree with the findings and the plan.  Multiple questions were asked by the patient and answered.   Nolon Stalls, MD 09/10/2017, 10:32 AM

## 2017-09-17 ENCOUNTER — Encounter
Admission: RE | Admit: 2017-09-17 | Discharge: 2017-09-17 | Disposition: A | Discharge status: Home / Self Care | Payer: Medicare Other | Source: Ambulatory Visit | Attending: Oncology | Admitting: Oncology

## 2017-09-17 DIAGNOSIS — C155 Malignant neoplasm of lower third of esophagus: Secondary | ICD-10-CM | POA: Diagnosis not present

## 2017-09-17 MED ORDER — TECHNETIUM TC 99M-LABELED RED BLOOD CELLS IV KIT
20.0000 | PACK | Freq: Once | INTRAVENOUS | Status: AC | PRN
  Administered 2017-09-17: 21.9 via INTRAVENOUS

## 2017-09-24 ENCOUNTER — Encounter: Payer: Self-pay | Admitting: Oncology

## 2017-09-24 ENCOUNTER — Inpatient Hospital Stay (HOSPITAL_BASED_OUTPATIENT_CLINIC_OR_DEPARTMENT_OTHER): Payer: Medicare Other | Admitting: Oncology

## 2017-09-24 ENCOUNTER — Inpatient Hospital Stay: Payer: Medicare Other

## 2017-09-24 ENCOUNTER — Inpatient Hospital Stay: Payer: Medicare Other | Attending: Oncology

## 2017-09-24 VITALS — BP 149/79 | HR 76 | Temp 96.7°F | Resp 18 | Ht 71.0 in | Wt 184.8 lb

## 2017-09-24 DIAGNOSIS — D701 Agranulocytosis secondary to cancer chemotherapy: Secondary | ICD-10-CM | POA: Insufficient documentation

## 2017-09-24 DIAGNOSIS — C155 Malignant neoplasm of lower third of esophagus: Secondary | ICD-10-CM | POA: Diagnosis not present

## 2017-09-24 DIAGNOSIS — Z5111 Encounter for antineoplastic chemotherapy: Secondary | ICD-10-CM | POA: Insufficient documentation

## 2017-09-24 DIAGNOSIS — D6959 Other secondary thrombocytopenia: Secondary | ICD-10-CM | POA: Insufficient documentation

## 2017-09-24 DIAGNOSIS — Z452 Encounter for adjustment and management of vascular access device: Secondary | ICD-10-CM | POA: Insufficient documentation

## 2017-09-24 DIAGNOSIS — C7989 Secondary malignant neoplasm of other specified sites: Secondary | ICD-10-CM

## 2017-09-24 DIAGNOSIS — C159 Malignant neoplasm of esophagus, unspecified: Secondary | ICD-10-CM

## 2017-09-24 DIAGNOSIS — D649 Anemia, unspecified: Secondary | ICD-10-CM

## 2017-09-24 DIAGNOSIS — Z5112 Encounter for antineoplastic immunotherapy: Secondary | ICD-10-CM

## 2017-09-24 LAB — CBC WITH DIFFERENTIAL/PLATELET
Basophils Absolute: 0 10*3/uL (ref 0–0.1)
Basophils Relative: 1 %
Eosinophils Absolute: 0.2 10*3/uL (ref 0–0.7)
Eosinophils Relative: 4 %
HCT: 29.6 % — ABNORMAL LOW (ref 40.0–52.0)
Hemoglobin: 10.2 g/dL — ABNORMAL LOW (ref 13.0–18.0)
Lymphocytes Relative: 18 %
Lymphs Abs: 0.8 10*3/uL — ABNORMAL LOW (ref 1.0–3.6)
MCH: 34.9 pg — ABNORMAL HIGH (ref 26.0–34.0)
MCHC: 34.4 g/dL (ref 32.0–36.0)
MCV: 101.5 fL — ABNORMAL HIGH (ref 80.0–100.0)
Monocytes Absolute: 0.5 10*3/uL (ref 0.2–1.0)
Monocytes Relative: 10 %
Neutro Abs: 3.1 10*3/uL (ref 1.4–6.5)
Neutrophils Relative %: 67 %
Platelets: 190 10*3/uL (ref 150–440)
RBC: 2.92 MIL/uL — ABNORMAL LOW (ref 4.40–5.90)
RDW: 15.5 % — ABNORMAL HIGH (ref 11.5–14.5)
WBC: 4.6 10*3/uL (ref 3.8–10.6)

## 2017-09-24 LAB — COMPREHENSIVE METABOLIC PANEL
ALT: 18 U/L (ref 17–63)
AST: 27 U/L (ref 15–41)
Albumin: 3.7 g/dL (ref 3.5–5.0)
Alkaline Phosphatase: 34 U/L — ABNORMAL LOW (ref 38–126)
Anion gap: 11 (ref 5–15)
BUN: 25 mg/dL — ABNORMAL HIGH (ref 6–20)
CO2: 24 mmol/L (ref 22–32)
Calcium: 9.1 mg/dL (ref 8.9–10.3)
Chloride: 105 mmol/L (ref 101–111)
Creatinine, Ser: 1.05 mg/dL (ref 0.61–1.24)
GFR calc Af Amer: >60 mL/min (ref >60)
GFR calc non Af Amer: >60 mL/min (ref >60)
Glucose, Bld: 154 mg/dL — ABNORMAL HIGH (ref 65–99)
Potassium: 3.8 mmol/L (ref 3.5–5.1)
Sodium: 140 mmol/L (ref 135–145)
Total Bilirubin: 0.3 mg/dL (ref 0.3–1.2)
Total Protein: 6.7 g/dL (ref 6.5–8.1)

## 2017-09-24 LAB — MAGNESIUM: Magnesium: 1.7 mg/dL (ref 1.7–2.4)

## 2017-09-24 MED ORDER — SODIUM CHLORIDE 0.9 % IV SOLN
2400.0000 mg/m2 | INTRAVENOUS | Status: DC
  Administered 2017-09-24: 4800 mg via INTRAVENOUS
  Filled 2017-09-24: qty 96

## 2017-09-24 MED ORDER — ACETAMINOPHEN 325 MG PO TABS
650.0000 mg | ORAL_TABLET | Freq: Once | ORAL | Status: AC
  Administered 2017-09-24: 650 mg via ORAL
  Filled 2017-09-24: qty 2

## 2017-09-24 MED ORDER — TRASTUZUMAB CHEMO 150 MG IV SOLR
6.0000 mg/kg | Freq: Once | INTRAVENOUS | Status: AC
  Administered 2017-09-24: 483 mg via INTRAVENOUS
  Filled 2017-09-24: qty 23

## 2017-09-24 MED ORDER — OXALIPLATIN CHEMO INJECTION 100 MG/20ML
65.0000 mg/m2 | Freq: Once | INTRAVENOUS | Status: AC
  Administered 2017-09-24: 130 mg via INTRAVENOUS
  Filled 2017-09-24: qty 20

## 2017-09-24 MED ORDER — SODIUM CHLORIDE 0.9% FLUSH
10.0000 mL | INTRAVENOUS | Status: DC | PRN
  Administered 2017-09-24: 10 mL via INTRAVENOUS
  Filled 2017-09-24: qty 10

## 2017-09-24 MED ORDER — DEXAMETHASONE SODIUM PHOSPHATE 10 MG/ML IJ SOLN
10.0000 mg | Freq: Once | INTRAMUSCULAR | Status: AC
  Administered 2017-09-24: 10 mg via INTRAVENOUS
  Filled 2017-09-24: qty 1

## 2017-09-24 MED ORDER — FLUOROURACIL CHEMO INJECTION 2.5 GM/50ML
400.0000 mg/m2 | Freq: Once | INTRAVENOUS | Status: AC
  Administered 2017-09-24: 800 mg via INTRAVENOUS
  Filled 2017-09-24: qty 16

## 2017-09-24 MED ORDER — DIPHENHYDRAMINE HCL 25 MG PO CAPS
50.0000 mg | ORAL_CAPSULE | Freq: Once | ORAL | Status: AC
  Administered 2017-09-24: 50 mg via ORAL
  Filled 2017-09-24: qty 2

## 2017-09-24 MED ORDER — PALONOSETRON HCL INJECTION 0.25 MG/5ML
0.2500 mg | Freq: Once | INTRAVENOUS | Status: AC
  Administered 2017-09-24: 0.25 mg via INTRAVENOUS
  Filled 2017-09-24: qty 5

## 2017-09-24 MED ORDER — SODIUM CHLORIDE 0.9 % IV SOLN
Freq: Once | INTRAVENOUS | Status: AC
  Administered 2017-09-24: 10:00:00 via INTRAVENOUS
  Filled 2017-09-24: qty 1000

## 2017-09-24 MED ORDER — LEUCOVORIN CALCIUM INJECTION 350 MG
400.0000 mg/m2 | Freq: Once | INTRAVENOUS | Status: AC
  Administered 2017-09-24: 800 mg via INTRAVENOUS
  Filled 2017-09-24: qty 25

## 2017-09-24 MED ORDER — SODIUM CHLORIDE 0.9 % IV SOLN: 10.0000 mg | Freq: Once | INTRAVENOUS | Status: DC

## 2017-09-24 MED ORDER — DEXTROSE 5 % IV SOLN
Freq: Once | INTRAVENOUS | Status: AC
  Administered 2017-09-24: 12:00:00 via INTRAVENOUS
  Filled 2017-09-24: qty 1000

## 2017-09-24 NOTE — Progress Notes (Signed)
No new changes noted today 

## 2017-09-24 NOTE — Progress Notes (Signed)
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 He also has a history of multiple back surgeries and chronic back pain but is otherwise healthy and lives with his wife. He is independent of his ADLs. He was seen by [facility] in August 2018 for symptoms of epigastric pain and some difficulty swallowing. He denies any change in his appetite or unintentional weight loss. He reports dysphagia to both solids and liquids.   Patient had a barium swallow in September 2018 which showed moderate sized reducible hiatal hernia without evidence of stricture or reflux or esophagitis. More proximally no stricture or spasm was observed. No laryngeal penetration of the barium  He continued to have symptoms and then underwent an upper endoscopy in January 2019 which showed a medium-sized fungating mass with no bleeding found in the distal esophagus 35-37 cm from the incisors. Mass was nonobstructing, partially obstructing and not circumferential. 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 Angiolymphatic invasion was present. Fundic gland polyp was noted in the stomach which was negative for H. pylori dysplasia and malignancy.  Patient underwent PET/CT scan on 05/28/2017 which showed small hypermetabolic mass in the distal esophagus  with an SUV of 10.7. Hypermetabolic left periaortic adenopathy compatible with malignancy. Larger of the 2 involved lymph nodes with a maximum SUV of 16.1. No other regions of tumor are identified.  Tumor marker testing shows he is positive for HER-2 and PDL 1 expressed  Biopsy of the left para-aortic lymph node positive for metastatic esophageal cancer  Patient completed concurrent chemo/RT on 07/24/17. He did miss his last dose of chemotherapy due to neutropenia  Repeat PET/Ct scan showed: IMPRESSION: 1. Today's study demonstrates a positive response to therapy with regression of the primary lesion in the distal esophagus and decreased hypermetabolism in retroperitoneal lymph nodes which are essentially unchanged in size. 2. There is a new focus of hypermetabolism in the proximal stomach just distal to the gastroesophageal junction. This is of uncertain etiology and significance, and could be physiologic, but correlation with endoscopy may be considered.  MUGA scan showed normal EF  Interval history- he feels much better after getting a break from chemo/Rt. Denies any pain. Able to swallow without difficulty  ECOG PS- 1 Pain scale- 0   Review of systems- Review of Systems  Constitutional: Negative for chills, fever, malaise/fatigue and weight loss.  HENT: Negative for congestion, ear discharge and nosebleeds.   Eyes: Negative for blurred vision.  Respiratory: Negative for cough, hemoptysis, sputum production, shortness of breath and wheezing.   Cardiovascular: Negative for chest pain, palpitations, orthopnea and claudication.  Gastrointestinal: Negative for abdominal pain, blood in stool, constipation, diarrhea, heartburn, melena, nausea and vomiting.  Genitourinary: Negative for dysuria, flank pain, frequency, hematuria and urgency.  Musculoskeletal: Negative for back pain, joint pain and myalgias.  Skin: Negative for rash.  Neurological: Negative for dizziness, tingling,  focal weakness, seizures, weakness  and headaches.  Endo/Heme/Allergies: Does not bruise/bleed easily.  Psychiatric/Behavioral: Negative for depression and suicidal ideas. The patient does not have insomnia.      Allergies  Allergen Reactions  . Morphine And Related Nausea Only     Past Medical History:  Diagnosis Date  . Anemia   . Arthritis   . Coronary artery disease   . Diverticulitis   . Dysrhythmia   . GERD (gastroesophageal reflux disease)   . Heart murmur   . History of hiatal hernia   . Hyperglycemia   . Hyperlipidemia   . Hypertension   . Hypothyroidism   . Spinal stenosis      Past Surgical History:  Procedure Laterality Date  . BACK SURGERY    . CATARACT EXTRACTION W/PHACO Left 11/08/2014   Procedure: CATARACT EXTRACTION PHACO AND INTRAOCULAR LENS PLACEMENT (IOC);  Surgeon: Estill Cotta, MD;  Location: [facility] ORS;  Service: Ophthalmology;  Laterality: Left;  US:01:20 AP:21.8 CDE:31.09 PAC LOT: [id number]  . CHOLECYSTECTOMY    . ESOPHAGOGASTRODUODENOSCOPY (EGD) WITH PROPOFOL N/A 05/23/2017   Procedure: ESOPHAGOGASTRODUODENOSCOPY (EGD) WITH PROPOFOL;  Surgeon: Lollie Sails, MD;  Location: [facility];  Service: Endoscopy;  Laterality: N/A;  . EYE SURGERY    . IR FLUORO GUIDE PORT INSERTION RIGHT  06/07/2017  . JOINT REPLACEMENT Right    Knee  . ROTATOR CUFF REPAIR Right     Social History   Socioeconomic History  . Marital status: Married    Spouse name: Not on file  . Number of children: Not on file  . Years of education: Not on file  . Highest education level: Not on file  Occupational History  . Not on file  Social Needs  . Financial resource strain: Not on file  . Food insecurity:    Worry: Not on file    Inability: Not on file  . Transportation needs:    Medical: Not on file    Non-medical: Not on file  Tobacco Use  . Smoking status: Former Smoker    Last attempt to quit: 03/24/1989    Years since quitting: 28.5  . Smokeless  tobacco: Never Used  Substance and Sexual Activity  . Alcohol use: No    Frequency: Never  . Drug use: No  . Sexual activity: Not on file  Lifestyle  . Physical activity:    Days per week: Not on file    Minutes per session: Not on file  . Stress: Not on file  Relationships  . Social connections:    Talks on phone: Not on file    Gets together: Not on file    Attends religious service: Not on file    Active member of club or organization: Not on file    Attends meetings of clubs or organizations: Not on file    Relationship status: Not on file  . Intimate partner violence:    Fear of current or ex partner: Not on file    Emotionally abused: Not on file    Physically abused: Not on file    Forced sexual activity: Not on file  Other Topics Concern  . Not on file  Social History Narrative  . Not on file    Family History  Problem Relation Age of Onset  . Colon cancer Mother      Current Outpatient Medications:  .  acetaminophen (TYLENOL) 650 MG CR tablet, Take 650 mg by mouth every 8 (eight) hours as needed for pain., Disp: , Rfl:  .  ascorbic acid (VITAMIN C) 1000 MG tablet, Take 1,000 mg by mouth daily., Disp: , Rfl:  .  cholecalciferol (VITAMIN D) 1000 units tablet, Take 1,000 Units by mouth daily., Disp: , Rfl:  .  co-enzyme Q-10 30 MG capsule, Take 30 mg by mouth daily., Disp: , Rfl:  .  docusate sodium (COLACE) 100 MG capsule, Take 100 mg by mouth daily., Disp: , Rfl:  .  levothyroxine (SYNTHROID, LEVOTHROID) 25 MCG tablet, Take 25 mcg by mouth daily before breakfast., Disp: , Rfl:  .  metoprolol tartrate (LOPRESSOR) 25 MG tablet, Take 25 mg by mouth daily., Disp: , Rfl:  .  Multiple Vitamin (MULTIVITAMIN) tablet, Take 1 tablet by mouth daily., Disp: , Rfl:  .  Omega-3 1000 MG CAPS, Take 340-1,000 capsules by mouth 2 (two) times daily., Disp: , Rfl:  .  omeprazole (PRILOSEC) 20 MG capsule, Take 20 mg by mouth daily., Disp: , Rfl:  .  pravastatin (PRAVACHOL) 20 MG  tablet, Take 20 mg by mouth daily., Disp: , Rfl:  .  predniSONE (DELTASONE) 5 MG tablet, Take 5 mg by mouth daily with breakfast., Disp: , Rfl:  .  Probiotic Product (PROBIOTIC COMPLEX ACIDOPHILUS PO), Take by mouth., Disp: , Rfl:  .  quinapril (ACCUPRIL) 40 MG tablet, Take 40 mg by mouth 1 day or 1 dose., Disp: , Rfl:  .  tamsulosin (FLOMAX) 0.4 MG CAPS capsule, Take 0.4 mg by mouth daily., Disp: , Rfl:  .  traMADol (ULTRAM-ER) 100 MG 24 hr tablet, Take 50 mg by mouth 2 (two) times daily. With lunch and dinner , Disp: , Rfl:  .  traMADol (ULTRAM-ER) 300 MG 24 hr tablet, Take 300 mg by mouth daily. AM dose, Disp: , Rfl:  .  vitamin B-12 (CYANOCOBALAMIN) 1000 MCG tablet, Take 1,500 mcg by mouth daily. , Disp: , Rfl:  .  atorvastatin (LIPITOR) 10 MG tablet, Take 10 mg by mouth daily., Disp: , Rfl:  .  BACILLUS COAGULANS-INULIN PO, Take by mouth., Disp: , Rfl:  .  HYDROcodone-acetaminophen (NORCO) 5-325 MG tablet, Take 1 tablet by mouth every 6 (six) hours as needed for moderate pain. (Patient not taking: Reported on 09/10/2017), Disp: 20 tablet, Rfl: 0 .  levothyroxine (SYNTHROID, LEVOTHROID) 100 MCG tablet, Take 25 mcg by mouth daily before breakfast., Disp: , Rfl:  .  sucralfate (CARAFATE) 1 g tablet, Take 1 tablet (1 g total) by mouth 3 (three) times daily. Dissolve in 2-3 tbsp warm water, swish and swallow. (Patient not taking: Reported on 08/26/2017), Disp: 90 tablet, Rfl: 3 No current facility-administered medications for this visit.   Facility-Administered Medications Ordered in Other Visits:  .  dextrose 5 % solution, , Intravenous, Once, Sindy Guadeloupe, MD .  fluorouracil (ADRUCIL) 4,800 mg in sodium chloride 0.9 % 54 mL chemo infusion, 2,400 mg/m2 (Treatment Plan Recorded), Intravenous, 1 day or 1 dose, Sindy Guadeloupe, MD .  fluorouracil (ADRUCIL) chemo injection 800 mg, 400 mg/m2 (Treatment Plan Recorded), Intravenous, Once, Sindy Guadeloupe, MD .  leucovorin 800 mg in dextrose 5 % 250 mL  infusion, 400 mg/m2 (Treatment Plan Recorded), Intravenous, Once, Sindy Guadeloupe, MD .  oxaliplatin (ELOXATIN) 130 mg in dextrose 5 % 500 mL chemo infusion, 65 mg/m2 (Treatment Plan Recorded), Intravenous, Once, Sindy Guadeloupe, MD .  sodium chloride flush (NS) 0.9 % injection 10 mL, 10 mL, Intravenous, PRN, Sindy Guadeloupe, MD, 10 mL at 09/24/17 0800 .  trastuzumab (HERCEPTIN) 483 mg in sodium chloride 0.9 %  250 mL chemo infusion, 6 mg/kg (Treatment Plan Recorded), Intravenous, Once, Sindy Guadeloupe, MD, Last Rate: 182 mL/hr at 09/24/17 1013, 483 mg at 09/24/17 1013  Physical exam:  Vitals:   09/24/17 0830  BP: (!) 149/79  Pulse: 76  Resp: 18  Temp: (!) 96.7 F (35.9 C)  TempSrc: Tympanic  SpO2: 100%  Weight: 184 lb 12.8 oz (83.8 kg)  Height: '5\' 11"'  (1.803 m)   Physical Exam  Constitutional: He is oriented to person, place, and time. He appears well-developed and well-nourished.  HENT:  Head: Normocephalic and atraumatic.  Eyes: Pupils are equal, round, and reactive to light. EOM are normal.  Neck: Normal range of motion.  Cardiovascular: Normal rate, regular rhythm and normal heart sounds.  Pulmonary/Chest: Effort normal and breath sounds normal.  Abdominal: Soft. Bowel sounds are normal.  Neurological: He is alert and oriented to person, place, and time.  Skin: Skin is warm and dry.     CMP Latest Ref Rng & Units 09/24/2017  Glucose 65 - 99 mg/dL 154(H)  BUN 6 - 20 mg/dL 25(H)  Creatinine 0.61 - 1.24 mg/dL 1.05  Sodium 135 - 145 mmol/L 140  Potassium 3.5 - 5.1 mmol/L 3.8  Chloride 101 - 111 mmol/L 105  CO2 22 - 32 mmol/L 24  Calcium 8.9 - 10.3 mg/dL 9.1  Total Protein 6.5 - 8.1 g/dL 6.7  Total Bilirubin 0.3 - 1.2 mg/dL 0.3  Alkaline Phos 38 - 126 U/L 34(L)  AST 15 - 41 U/L 27  ALT 17 - 63 U/L 18   CBC Latest Ref Rng & Units 09/24/2017  WBC 3.8 - 10.6 K/uL 4.6  Hemoglobin 13.0 - 18.0 g/dL 10.2(L)  Hematocrit 40.0 - 52.0 % 29.6(L)  Platelets 150 - 440 K/uL 190    No  images are attached to the encounter.  Nm Cardiac Muga Rest  Result Date: 09/19/2017 CLINICAL DATA:  Pre chemotherapy.  Esophageal cancer. EXAM: NUCLEAR MEDICINE CARDIAC BLOOD POOL IMAGING (MUGA) TECHNIQUE: Cardiac multi-gated acquisition was performed at rest following intravenous injection of Tc-59mlabeled red blood cells. RADIOPHARMACEUTICALS:  21.9 mCi Tc-930mertechnetate in-vitro labeled red blood cells IV COMPARISON:  None. FINDINGS: Normal left ventricular wall motion. No akinetic or dyskinetic segments are identified. The ejection fraction is 61.9%. IMPRESSION: Normal left ventricular wall motion with left ventricular ejection fraction of 61.9%. Electronically Signed   By: P.Marijo Sanes.D.   On: 09/19/2017 11:12   Nm Pet Image Restag (ps) Skull Base To Thigh  Result Date: 09/04/2017 CLINICAL DATA:  Subsequent treatment strategy for esophageal carcinoma. Follow-up study. EXAM: NUCLEAR MEDICINE PET SKULL BASE TO THIGH TECHNIQUE: 9.49 mCi F-18 FDG was injected intravenously. Full-ring PET imaging was performed from the skull base to thigh after the radiotracer. CT data was obtained and used for attenuation correction and anatomic localization. Fasting blood glucose: 92 mg/dl COMPARISON:  PET-CT 05/28/2017. FINDINGS: Mediastinal blood pool activity: SUV max 2.6 NECK: No hypermetabolic lymph nodes in the neck. Incidental CT findings: none CHEST: Previously noted hypermetabolism in the distal third of the esophagus has significantly decreased (SUVmax = 3.5 as compared with 10.7 on prior study 05/28/2017) indicative of positive response to therapy. No associated mass-like thickening of the esophagus in this region appreciated on the CT portion of the examination. No hypermetabolic mediastinal or hilar lymph nodes. No suspicious pulmonary nodules on the CT scan. Incidental CT findings: Right internal jugular single-lumen porta cath with tip terminating at the superior cavoatrial junction. There is aortic  atherosclerosis,  as well as atherosclerosis of the great vessels of the mediastinum and the coronary arteries, including calcified atherosclerotic plaque in the left anterior descending, left circumflex and right coronary arteries. Severe calcifications of the aortic valve. Mild calcifications of the mitral annulus. ABDOMEN/PELVIS: Focus of hypermetabolism in the proximal stomach just distal to the gastroesophageal junction (SUVmax = 7.4), without a corresponding CT abnormality. Previously noted left para-aortic lymphadenopathy there is similar in size (9 mm short axis) but has decreased in hypermetabolism (SUVmax = 4.6 versus 10.5 on prior examination) . other previously noted left para-aortic lymphadenopathy (axial image 176 of series 3) is also similar in size (12 mm), but has decreased in hypermetabolism (SUVmax = 11.9 versus 16.1 on prior examination). Incidental CT findings: Aortic atherosclerosis. Status post cholecystectomy. Large right-sided posterior bladder wall diverticulum. SKELETON: No focal hypermetabolic activity to suggest skeletal metastasis. Incidental CT findings: none IMPRESSION: 1. Today's study demonstrates a positive response to therapy with regression of the primary lesion in the distal esophagus and decreased hypermetabolism in retroperitoneal lymph nodes which are essentially unchanged in size. 2. There is a new focus of hypermetabolism in the proximal stomach just distal to the gastroesophageal junction. This is of uncertain etiology and significance, and could be physiologic, but correlation with endoscopy may be considered. 3. Aortic atherosclerosis, in addition to left main and 3 vessel coronary artery disease. 4. There are calcifications of the aortic valve and mitral annulus. Echocardiographic correlation for evaluation of potential valvular dysfunction may be warranted if clinically indicated. Aortic Atherosclerosis (ICD10-I70.0). Electronically Signed   By: Vinnie Langton M.D.    On: 09/04/2017 15:30     Assessment and plan- Patient is a 84 y.o. male with stage IV esophageal adenocarcinomacTX N0M1with metastases to the para-aortic lymph nodes/p concurrent chemo/RT with carbo/taxol here for on treatment assessment prior to cycle 1 of FOLFOX herceptin  I reviewed the PET CT images independently and discussed findings with the patient. Overall he has responded well to carbo/taxol RT. Primary esophageal lesion is smaller and paraaortic adenopathy is stable in size and appears less hypermetabolic. Etiology of proximal stomach hypermetabolism is unclear.  Discussed continued wait and watch approach and initiating treatment at defitnite progression versus proceeding with FOLFOX herceptin at this time given that he has stage IV disease. If he has good response with chemotherapy we can always give him a treatment break down the line given his age and potential for toxicity. Patient understands chemotherapy is palliative and curative.  MUGA scan shows normal EF. He will therefore also proceed with herceptin with FOLFOX Q2 weeks. Loading dose today  Patient has baseline chronic normocytic anemia likely due to chronic disease which has remained stable  I will see him back in 2 weeks time for cycle 2 of FOLFOX herceptin. I am decreasing oxaliplatin dose to 65 mg/meter square given his age and concern for toxicity   Visit Diagnosis 1. Malignant neoplasm of lower third of esophagus (HCC)   2. Encounter for antineoplastic chemotherapy   3. Encounter for monoclonal antibody treatment for malignancy      Dr. Randa Evens, MD, MPH [facility] at [facility] [phone] 09/24/2017 11:30 AM

## 2017-09-24 NOTE — Progress Notes (Signed)
Verified herceptin dose with MD. 6mg /kg LD then 4mg /kg per following article:for FOLFOX + herceptin metastatic gastric and GE junction   [email or web address]

## 2017-09-26 ENCOUNTER — Inpatient Hospital Stay: Payer: Medicare Other

## 2017-09-26 VITALS — BP 163/65 | HR 84 | Temp 96.5°F | Resp 18

## 2017-09-26 DIAGNOSIS — C159 Malignant neoplasm of esophagus, unspecified: Secondary | ICD-10-CM

## 2017-09-26 DIAGNOSIS — Z5112 Encounter for antineoplastic immunotherapy: Secondary | ICD-10-CM | POA: Diagnosis not present

## 2017-09-26 MED ORDER — HEPARIN SOD (PORK) LOCK FLUSH 100 UNIT/ML IV SOLN
500.0000 [IU] | Freq: Once | INTRAVENOUS | Status: AC | PRN
  Administered 2017-09-26: 500 [IU]
  Filled 2017-09-26: qty 5

## 2017-09-26 MED ORDER — SODIUM CHLORIDE 0.9% FLUSH
10.0000 mL | INTRAVENOUS | Status: DC | PRN
  Administered 2017-09-26: 10 mL
  Filled 2017-09-26: qty 10

## 2017-10-07 ENCOUNTER — Other Ambulatory Visit: Payer: Self-pay

## 2017-10-07 DIAGNOSIS — C155 Malignant neoplasm of lower third of esophagus: Secondary | ICD-10-CM

## 2017-10-08 ENCOUNTER — Encounter: Payer: Self-pay | Admitting: Oncology

## 2017-10-08 ENCOUNTER — Inpatient Hospital Stay (HOSPITAL_BASED_OUTPATIENT_CLINIC_OR_DEPARTMENT_OTHER): Payer: Medicare Other | Admitting: Oncology

## 2017-10-08 ENCOUNTER — Inpatient Hospital Stay: Payer: Medicare Other

## 2017-10-08 VITALS — BP 166/81 | HR 78 | Temp 97.1°F | Resp 18 | Ht 71.0 in | Wt 181.7 lb

## 2017-10-08 DIAGNOSIS — D649 Anemia, unspecified: Secondary | ICD-10-CM | POA: Diagnosis not present

## 2017-10-08 DIAGNOSIS — D701 Agranulocytosis secondary to cancer chemotherapy: Secondary | ICD-10-CM | POA: Diagnosis not present

## 2017-10-08 DIAGNOSIS — C155 Malignant neoplasm of lower third of esophagus: Secondary | ICD-10-CM

## 2017-10-08 DIAGNOSIS — D6959 Other secondary thrombocytopenia: Secondary | ICD-10-CM | POA: Diagnosis not present

## 2017-10-08 DIAGNOSIS — C7989 Secondary malignant neoplasm of other specified sites: Secondary | ICD-10-CM | POA: Diagnosis not present

## 2017-10-08 DIAGNOSIS — T451X5A Adverse effect of antineoplastic and immunosuppressive drugs, initial encounter: Secondary | ICD-10-CM

## 2017-10-08 DIAGNOSIS — Z5112 Encounter for antineoplastic immunotherapy: Secondary | ICD-10-CM | POA: Diagnosis not present

## 2017-10-08 LAB — COMPREHENSIVE METABOLIC PANEL
ALBUMIN: 3.8 g/dL (ref 3.5–5.0)
ALK PHOS: 41 U/L (ref 38–126)
ALT: 21 U/L (ref 17–63)
AST: 28 U/L (ref 15–41)
Anion gap: 7 (ref 5–15)
BUN: 28 mg/dL — AB (ref 6–20)
CALCIUM: 9 mg/dL (ref 8.9–10.3)
CO2: 24 mmol/L (ref 22–32)
CREATININE: 1.05 mg/dL (ref 0.61–1.24)
Chloride: 108 mmol/L (ref 101–111)
GFR calc non Af Amer: >60 mL/min (ref >60)
GLUCOSE: 136 mg/dL — AB (ref 65–99)
Potassium: 3.9 mmol/L (ref 3.5–5.1)
SODIUM: 139 mmol/L (ref 135–145)
Total Bilirubin: 0.4 mg/dL (ref 0.3–1.2)
Total Protein: 6.5 g/dL (ref 6.5–8.1)

## 2017-10-08 LAB — CBC WITH DIFFERENTIAL/PLATELET
BASOS PCT: 1 %
Basophils Absolute: 0 10*3/uL (ref 0–0.1)
EOS ABS: 0.1 10*3/uL (ref 0–0.7)
EOS PCT: 5 %
HCT: 27.5 % — ABNORMAL LOW (ref 40.0–52.0)
Hemoglobin: 9.7 g/dL — ABNORMAL LOW (ref 13.0–18.0)
Lymphocytes Relative: 42 %
Lymphs Abs: 1 10*3/uL (ref 1.0–3.6)
MCH: 36.1 pg — AB (ref 26.0–34.0)
MCHC: 35.4 g/dL (ref 32.0–36.0)
MCV: 102 fL — ABNORMAL HIGH (ref 80.0–100.0)
MONO ABS: 0.4 10*3/uL (ref 0.2–1.0)
Monocytes Relative: 16 %
Neutro Abs: 0.8 10*3/uL — ABNORMAL LOW (ref 1.4–6.5)
Neutrophils Relative %: 36 %
Platelets: 111 10*3/uL — ABNORMAL LOW (ref 150–440)
RBC: 2.69 MIL/uL — ABNORMAL LOW (ref 4.40–5.90)
RDW: 14.9 % — AB (ref 11.5–14.5)
WBC: 2.3 10*3/uL — ABNORMAL LOW (ref 3.8–10.6)

## 2017-10-08 MED ORDER — HEPARIN SOD (PORK) LOCK FLUSH 100 UNIT/ML IV SOLN
500.0000 [IU] | Freq: Once | INTRAVENOUS | Status: AC
  Administered 2017-10-08: 500 [IU] via INTRAVENOUS

## 2017-10-08 MED ORDER — SODIUM CHLORIDE 0.9% FLUSH
10.0000 mL | INTRAVENOUS | Status: DC | PRN
  Administered 2017-10-08: 10 mL via INTRAVENOUS
  Filled 2017-10-08: qty 10

## 2017-10-08 NOTE — Progress Notes (Signed)
No new changes noted today 

## 2017-10-08 NOTE — Progress Notes (Signed)
No treatment today per MD.  

## 2017-10-08 NOTE — Progress Notes (Signed)
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 He also has a history of multiple back surgeries and chronic back pain but is otherwise healthy and lives with his wife. He is independent of his ADLs. He was seen by [facility] clinic GI in August 2018 for symptoms of epigastric pain and some difficulty swallowing. He denies any change in his appetite or unintentional weight loss. He reports dysphagia to both solids and liquids.   Patient had a barium swallow in September 2018 which showed moderate sized reducible hiatal hernia without evidence of stricture or reflux or esophagitis. More proximally no stricture or spasm was observed. No laryngeal penetration of the barium  He continued to have symptoms and then underwent an upper endoscopy in January 2019 which showed a medium-sized fungating mass with no bleeding found in the distal esophagus 35-37 cm from the incisors. Mass was nonobstructing, partially obstructing and not circumferential. 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 Angiolymphatic invasion was present. Fundic gland polyp was noted in the stomach which was negative for H. pylori dysplasia and malignancy.  Patient underwent PET/CT scan on 05/28/2017 which showed small hypermetabolic mass in the distal esophagus  with an SUV of 10.7. Hypermetabolic left periaortic adenopathy compatible with malignancy. Larger of the 2 involved lymph nodes with a maximum SUV of 16.1. No other regions of tumor are identified.  Tumor marker testing shows he is positive for HER-2 and PDL 1 expressed  Biopsy of the left para-aortic lymph node positive for metastatic esophageal cancer  Patient completed concurrent chemo/RT on 07/24/17. He did miss his last dose of chemotherapy due to neutropenia  Repeat PET/Ct scan showed: IMPRESSION: 1. Today's study demonstrates a positive response to therapy with regression of the primary lesion in the distal esophagus and decreased hypermetabolism in retroperitoneal lymph nodes which are essentially unchanged in size. 2. There is a new focus of hypermetabolism in the proximal stomach just distal to the gastroesophageal junction. This is of uncertain etiology and significance, and could be physiologic, but correlation with endoscopy may be considered.  MUGA scan showed normal EF  Interval history- he tolerated cycle 1 of chemotherapy well. Had no nausea, vomiting, fatigue or other side effects  ECOG PS- 1 Pain scale- 0   Review of systems- Review of Systems  Constitutional: Negative for chills, fever, malaise/fatigue and weight loss.  HENT: Negative for congestion, ear discharge and nosebleeds.   Eyes: Negative for blurred vision.  Respiratory: Negative for cough, hemoptysis, sputum production, shortness of breath and wheezing.   Cardiovascular: Negative for chest pain, palpitations, orthopnea and claudication.  Gastrointestinal: Negative for abdominal pain, blood in stool, constipation, diarrhea, heartburn, melena, nausea and vomiting.  Genitourinary: Negative for dysuria, flank pain, frequency, hematuria and urgency.  Musculoskeletal: Negative for back pain, joint pain and myalgias.  Skin: Negative for rash.  Neurological: Negative for dizziness, tingling, focal  weakness, seizures, weakness and headaches.  Endo/Heme/Allergies: Does not bruise/bleed easily.  Psychiatric/Behavioral: Negative for depression and suicidal ideas. The patient does not have insomnia.       Allergies  Allergen Reactions  . Morphine And Related Nausea Only     Past Medical History:  Diagnosis Date  . Anemia   . Arthritis   . Coronary artery disease   . Diverticulitis   . Dysrhythmia   . GERD (gastroesophageal reflux disease)   . Heart murmur   . History of hiatal hernia   . Hyperglycemia   . Hyperlipidemia   . Hypertension   . Hypothyroidism   . Spinal stenosis      Past Surgical History:  Procedure Laterality Date  . BACK SURGERY    . CATARACT EXTRACTION W/PHACO Left 11/08/2014   Procedure: CATARACT EXTRACTION PHACO AND INTRAOCULAR LENS PLACEMENT (IOC);  Surgeon: Estill Cotta, MD;  Location: [facility] ORS;  Service: Ophthalmology;  Laterality: Left;  US:01:20 AP:21.8 CDE:31.09 PAC LOT: [id number]  . CHOLECYSTECTOMY    . ESOPHAGOGASTRODUODENOSCOPY (EGD) WITH PROPOFOL N/A 05/23/2017   Procedure: ESOPHAGOGASTRODUODENOSCOPY (EGD) WITH PROPOFOL;  Surgeon: Lollie Sails, MD;  Location: [facility] ENDOSCOPY;  Service: Endoscopy;  Laterality: N/A;  . EYE SURGERY    . IR FLUORO GUIDE PORT INSERTION RIGHT  06/07/2017  . JOINT REPLACEMENT Right    Knee  . ROTATOR CUFF REPAIR Right     Social History   Socioeconomic History  . Marital status: Married    Spouse name: Not on file  . Number of children: Not on file  . Years of education: Not on file  . Highest education level: Not on file  Occupational History  . Not on file  Social Needs  . Financial resource strain: Not on file  . Food insecurity:    Worry: Not on file    Inability: Not on file  . Transportation needs:    Medical: Not on file    Non-medical: Not on file  Tobacco Use  . Smoking status: Former Smoker    Last attempt to quit: 03/24/1989    Years since quitting: 28.5  . Smokeless  tobacco: Never Used  Substance and Sexual Activity  . Alcohol use: No    Frequency: Never  . Drug use: No  . Sexual activity: Not on file  Lifestyle  . Physical activity:    Days per week: Not on file    Minutes per session: Not on file  . Stress: Not on file  Relationships  . Social connections:    Talks on phone: Not on file    Gets together: Not on file    Attends religious service: Not on file    Active member of club or organization: Not on file    Attends meetings of clubs or organizations: Not on file    Relationship status: Not on file  . Intimate partner violence:    Fear of current or ex partner: Not on file    Emotionally abused: Not on file    Physically abused: Not on file    Forced sexual activity: Not on file  Other Topics Concern  . Not on file  Social History Narrative  . Not on file    Family History  Problem Relation Age of Onset  . Colon cancer Mother      Current Outpatient Medications:  .  acetaminophen (TYLENOL) 650 MG CR tablet, Take 650 mg by mouth every 8 (eight) hours as needed for pain., Disp: , Rfl:  .  ascorbic acid (  VITAMIN C) 1000 MG tablet, Take 1,000 mg by mouth daily., Disp: , Rfl:  .  atorvastatin (LIPITOR) 10 MG tablet, Take 10 mg by mouth daily., Disp: , Rfl:  .  BACILLUS COAGULANS-INULIN PO, Take by mouth., Disp: , Rfl:  .  cholecalciferol (VITAMIN D) 1000 units tablet, Take 1,000 Units by mouth daily., Disp: , Rfl:  .  docusate sodium (COLACE) 100 MG capsule, Take 100 mg by mouth daily., Disp: , Rfl:  .  levothyroxine (SYNTHROID, LEVOTHROID) 100 MCG tablet, Take 25 mcg by mouth daily before breakfast., Disp: , Rfl:  .  levothyroxine (SYNTHROID, LEVOTHROID) 25 MCG tablet, Take 25 mcg by mouth daily before breakfast., Disp: , Rfl:  .  metoprolol tartrate (LOPRESSOR) 25 MG tablet, Take 25 mg by mouth daily., Disp: , Rfl:  .  Omega-3 1000 MG CAPS, Take 340-1,000 capsules by mouth 2 (two) times daily., Disp: , Rfl:  .  omeprazole  (PRILOSEC) 20 MG capsule, Take 20 mg by mouth daily., Disp: , Rfl:  .  pravastatin (PRAVACHOL) 20 MG tablet, Take 20 mg by mouth daily., Disp: , Rfl:  .  predniSONE (DELTASONE) 5 MG tablet, Take 5 mg by mouth daily with breakfast., Disp: , Rfl:  .  quinapril (ACCUPRIL) 40 MG tablet, Take 40 mg by mouth 1 day or 1 dose., Disp: , Rfl:  .  tamsulosin (FLOMAX) 0.4 MG CAPS capsule, Take 0.4 mg by mouth daily., Disp: , Rfl:  .  traMADol (ULTRAM-ER) 100 MG 24 hr tablet, Take 50 mg by mouth 2 (two) times daily. With lunch and dinner , Disp: , Rfl:  .  traMADol (ULTRAM-ER) 300 MG 24 hr tablet, Take 300 mg by mouth daily. AM dose, Disp: , Rfl:  .  vitamin B-12 (CYANOCOBALAMIN) 1000 MCG tablet, Take 1,500 mcg by mouth daily. , Disp: , Rfl:  .  co-enzyme Q-10 30 MG capsule, Take 30 mg by mouth daily., Disp: , Rfl:  .  HYDROcodone-acetaminophen (NORCO) 5-325 MG tablet, Take 1 tablet by mouth every 6 (six) hours as needed for moderate pain. (Patient not taking: Reported on 09/10/2017), Disp: 20 tablet, Rfl: 0 .  Multiple Vitamin (MULTIVITAMIN) tablet, Take 1 tablet by mouth daily., Disp: , Rfl:  .  Probiotic Product (PROBIOTIC COMPLEX ACIDOPHILUS PO), Take by mouth., Disp: , Rfl:  .  sucralfate (CARAFATE) 1 g tablet, Take 1 tablet (1 g total) by mouth 3 (three) times daily. Dissolve in 2-3 tbsp warm water, swish and swallow. (Patient not taking: Reported on 08/26/2017), Disp: 90 tablet, Rfl: 3 No current facility-administered medications for this visit.   Facility-Administered Medications Ordered in Other Visits:  .  sodium chloride flush (NS) 0.9 % injection 10 mL, 10 mL, Intravenous, PRN, Sindy Guadeloupe, MD, 10 mL at 10/08/17 0837  Physical exam:  Vitals:   10/08/17 0855  BP: (!) 166/81  Pulse: 78  Resp: 18  Temp: (!) 97.1 F (36.2 C)  TempSrc: Tympanic  SpO2: 99%  Weight: 181 lb 11.2 oz (82.4 kg)  Height: '5\' 11"'  (1.803 m)   Physical Exam  Constitutional: He is oriented to person, place, and time.  He appears well-developed and well-nourished.  HENT:  Head: Normocephalic and atraumatic.  Eyes: Pupils are equal, round, and reactive to light. EOM are normal.  Neck: Normal range of motion.  Cardiovascular: Normal rate, regular rhythm and normal heart sounds.  Pulmonary/Chest: Effort normal and breath sounds normal.  Abdominal: Soft. Bowel sounds are normal.  Neurological: He is alert and oriented  to person, place, and time.  Skin: Skin is warm and dry.     CMP Latest Ref Rng & Units 10/08/2017  Glucose 65 - 99 mg/dL 136(H)  BUN 6 - 20 mg/dL 28(H)  Creatinine 0.61 - 1.24 mg/dL 1.05  Sodium 135 - 145 mmol/L 139  Potassium 3.5 - 5.1 mmol/L 3.9  Chloride 101 - 111 mmol/L 108  CO2 22 - 32 mmol/L 24  Calcium 8.9 - 10.3 mg/dL 9.0  Total Protein 6.5 - 8.1 g/dL 6.5  Total Bilirubin 0.3 - 1.2 mg/dL 0.4  Alkaline Phos 38 - 126 U/L 41  AST 15 - 41 U/L 28  ALT 17 - 63 U/L 21   CBC Latest Ref Rng & Units 10/08/2017  WBC 3.8 - 10.6 K/uL 2.3(L)  Hemoglobin 13.0 - 18.0 g/dL 9.7(L)  Hematocrit 40.0 - 52.0 % 27.5(L)  Platelets 150 - 440 K/uL 111(L)    No images are attached to the encounter.  Nm Cardiac Muga Rest  Result Date: 09/19/2017 CLINICAL DATA:  Pre chemotherapy.  Esophageal cancer. EXAM: NUCLEAR MEDICINE CARDIAC BLOOD POOL IMAGING (MUGA) TECHNIQUE: Cardiac multi-gated acquisition was performed at rest following intravenous injection of Tc-4mlabeled red blood cells. RADIOPHARMACEUTICALS:  21.9 mCi Tc-967mertechnetate in-vitro labeled red blood cells IV COMPARISON:  None. FINDINGS: Normal left ventricular wall motion. No akinetic or dyskinetic segments are identified. The ejection fraction is 61.9%. IMPRESSION: Normal left ventricular wall motion with left ventricular ejection fraction of 61.9%. Electronically Signed   By: P.Marijo Sanes.D.   On: 09/19/2017 11:12     Assessment and plan- Patient is a [age].o. male with stage IV esophageal adenocarcinomacTX N0M1with metastases to  the para-aortic lymph nodes/p concurrent chemo/RT with carbo/taxol here for on treatment assessment prior to cycle 2 of FOLFOX /herceptin chemotherapy  Patient tolerated his cycle 1 of chemotherapy and Herceptin well.  Today his white count was 2.3 with an ANC of 0.8.  I will therefore hold off on giving him cycle 2 today.  He will come back in 1 week time and get a repeat CBC with differential checked.  If his [place] greater than 1 he can proceed with chemotherapy/herceptin at that time.  Also given his age, prior chemotherapy and neutropenia 2 weeks after cycle 1 of FOLFOX I will plan to give him Neulasta on day 3 when he comes for his pump disconnect.  Patient has chronic normocytic anemia and his hemoglobin is around 10.  Stable continue to monitor.  I will see him back in 3 weeks time with CBC and CMP for cycle #3 of FOLFOX chemotherapy along with Herceptin and Neulasta support     Visit Diagnosis 1. Malignant neoplasm of lower third of esophagus (HCC)   2. Chemotherapy induced neutropenia (HCC)   3. Chemotherapy-induced thrombocytopenia      Dr. ArRanda EvensMD, MPH CHEdwards[facility] LLCt AlP[facility] [id number]/18/2019 12:59 PM

## 2017-10-09 LAB — CEA: CEA1: 5.6 ng/mL — AB (ref 0.0–4.7)

## 2017-10-15 ENCOUNTER — Inpatient Hospital Stay: Payer: Medicare Other

## 2017-10-15 VITALS — BP 138/71 | HR 96 | Temp 95.0°F | Resp 18 | Wt 184.5 lb

## 2017-10-15 DIAGNOSIS — C159 Malignant neoplasm of esophagus, unspecified: Secondary | ICD-10-CM

## 2017-10-15 DIAGNOSIS — C155 Malignant neoplasm of lower third of esophagus: Secondary | ICD-10-CM

## 2017-10-15 DIAGNOSIS — Z5112 Encounter for antineoplastic immunotherapy: Secondary | ICD-10-CM | POA: Diagnosis not present

## 2017-10-15 LAB — CBC WITH DIFFERENTIAL/PLATELET
BASOS ABS: 0 10*3/uL (ref 0–0.1)
BASOS PCT: 1 %
Eosinophils Absolute: 0.1 10*3/uL (ref 0–0.7)
Eosinophils Relative: 2 %
HCT: 24.7 % — ABNORMAL LOW (ref 40.0–52.0)
Hemoglobin: 8.6 g/dL — ABNORMAL LOW (ref 13.0–18.0)
Lymphocytes Relative: 23 %
Lymphs Abs: 1 10*3/uL (ref 1.0–3.6)
MCH: 35.6 pg — ABNORMAL HIGH (ref 26.0–34.0)
MCHC: 34.6 g/dL (ref 32.0–36.0)
MCV: 102.8 fL — ABNORMAL HIGH (ref 80.0–100.0)
MONO ABS: 0.8 10*3/uL (ref 0.2–1.0)
Monocytes Relative: 20 %
Neutro Abs: 2.3 10*3/uL (ref 1.4–6.5)
Neutrophils Relative %: 54 %
PLATELETS: 190 10*3/uL (ref 150–440)
RBC: 2.4 MIL/uL — AB (ref 4.40–5.90)
RDW: 14.9 % — AB (ref 11.5–14.5)
WBC: 4.3 10*3/uL (ref 3.8–10.6)

## 2017-10-15 LAB — COMPREHENSIVE METABOLIC PANEL
ALBUMIN: 3.6 g/dL (ref 3.5–5.0)
ALK PHOS: 38 U/L (ref 38–126)
ALT: 16 U/L (ref 0–44)
ANION GAP: 8 (ref 5–15)
AST: 30 U/L (ref 15–41)
BILIRUBIN TOTAL: 0.5 mg/dL (ref 0.3–1.2)
BUN: 25 mg/dL — AB (ref 8–23)
CALCIUM: 9.1 mg/dL (ref 8.9–10.3)
CO2: 22 mmol/L (ref 22–32)
Chloride: 108 mmol/L (ref 98–111)
Creatinine, Ser: 1.1 mg/dL (ref 0.61–1.24)
GFR calc Af Amer: >60 mL/min (ref >60)
GFR calc non Af Amer: >60 mL/min (ref >60)
GLUCOSE: 158 mg/dL — AB (ref 70–99)
Potassium: 3.6 mmol/L (ref 3.5–5.1)
Sodium: 138 mmol/L (ref 135–145)
TOTAL PROTEIN: 6.7 g/dL (ref 6.5–8.1)

## 2017-10-15 MED ORDER — TRASTUZUMAB CHEMO 150 MG IV SOLR
300.0000 mg | Freq: Once | INTRAVENOUS | Status: AC
  Administered 2017-10-15: 300 mg via INTRAVENOUS
  Filled 2017-10-15: qty 14.29

## 2017-10-15 MED ORDER — ACETAMINOPHEN 325 MG PO TABS
650.0000 mg | ORAL_TABLET | Freq: Once | ORAL | Status: AC
  Administered 2017-10-15: 650 mg via ORAL
  Filled 2017-10-15: qty 2

## 2017-10-15 MED ORDER — DIPHENHYDRAMINE HCL 25 MG PO CAPS
50.0000 mg | ORAL_CAPSULE | Freq: Once | ORAL | Status: AC
  Administered 2017-10-15: 50 mg via ORAL
  Filled 2017-10-15: qty 2

## 2017-10-15 MED ORDER — DEXTROSE 5 % IV SOLN
Freq: Once | INTRAVENOUS | Status: AC
  Administered 2017-10-15: 11:00:00 via INTRAVENOUS
  Filled 2017-10-15: qty 1000

## 2017-10-15 MED ORDER — OXALIPLATIN CHEMO INJECTION 100 MG/20ML
65.0000 mg/m2 | Freq: Once | INTRAVENOUS | Status: AC
  Administered 2017-10-15: 130 mg via INTRAVENOUS
  Filled 2017-10-15: qty 20

## 2017-10-15 MED ORDER — LEUCOVORIN CALCIUM INJECTION 350 MG
400.0000 mg/m2 | Freq: Once | INTRAVENOUS | Status: AC
  Administered 2017-10-15: 800 mg via INTRAVENOUS
  Filled 2017-10-15: qty 25

## 2017-10-15 MED ORDER — DEXAMETHASONE SODIUM PHOSPHATE 10 MG/ML IJ SOLN
10.0000 mg | Freq: Once | INTRAMUSCULAR | Status: AC
  Administered 2017-10-15: 10 mg via INTRAVENOUS
  Filled 2017-10-15: qty 1

## 2017-10-15 MED ORDER — FLUOROURACIL CHEMO INJECTION 2.5 GM/50ML
400.0000 mg/m2 | Freq: Once | INTRAVENOUS | Status: AC
  Administered 2017-10-15: 800 mg via INTRAVENOUS
  Filled 2017-10-15: qty 16

## 2017-10-15 MED ORDER — PALONOSETRON HCL INJECTION 0.25 MG/5ML
0.2500 mg | Freq: Once | INTRAVENOUS | Status: AC
  Administered 2017-10-15: 0.25 mg via INTRAVENOUS
  Filled 2017-10-15: qty 5

## 2017-10-15 MED ORDER — SODIUM CHLORIDE 0.9 % IV SOLN: 10.0000 mg | Freq: Once | INTRAVENOUS | Status: DC

## 2017-10-15 MED ORDER — SODIUM CHLORIDE 0.9 % IV SOLN
2400.0000 mg/m2 | INTRAVENOUS | Status: DC
  Administered 2017-10-15: 4800 mg via INTRAVENOUS
  Filled 2017-10-15: qty 96

## 2017-10-15 MED ORDER — SODIUM CHLORIDE 0.9 % IV SOLN
Freq: Once | INTRAVENOUS | Status: AC
  Administered 2017-10-15: 10:00:00 via INTRAVENOUS
  Filled 2017-10-15: qty 1000

## 2017-10-15 NOTE — Progress Notes (Signed)
Blood return noted before, during and after Adrucil push.  

## 2017-10-17 ENCOUNTER — Inpatient Hospital Stay: Payer: Medicare Other

## 2017-10-17 VITALS — BP 160/80 | HR 72 | Temp 98.0°F | Resp 20

## 2017-10-17 DIAGNOSIS — Z5112 Encounter for antineoplastic immunotherapy: Secondary | ICD-10-CM | POA: Diagnosis not present

## 2017-10-17 DIAGNOSIS — C159 Malignant neoplasm of esophagus, unspecified: Secondary | ICD-10-CM

## 2017-10-17 MED ORDER — HEPARIN SOD (PORK) LOCK FLUSH 100 UNIT/ML IV SOLN
500.0000 [IU] | Freq: Once | INTRAVENOUS | Status: AC | PRN
  Administered 2017-10-17: 500 [IU]
  Filled 2017-10-17: qty 5

## 2017-10-17 MED ORDER — PEGFILGRASTIM-CBQV 6 MG/0.6ML ~~LOC~~ SOSY
6.0000 mg | PREFILLED_SYRINGE | Freq: Once | SUBCUTANEOUS | Status: AC
  Administered 2017-10-17: 6 mg via SUBCUTANEOUS
  Filled 2017-10-17: qty 0.6

## 2017-10-17 MED ORDER — SODIUM CHLORIDE 0.9% FLUSH
10.0000 mL | INTRAVENOUS | Status: DC | PRN
  Administered 2017-10-17: 10 mL
  Filled 2017-10-17: qty 10

## 2017-10-21 ENCOUNTER — Ambulatory Visit: Payer: Medicare Other | Admitting: Oncology

## 2017-10-21 ENCOUNTER — Ambulatory Visit: Payer: Medicare Other

## 2017-10-21 ENCOUNTER — Other Ambulatory Visit: Payer: Medicare Other

## 2017-10-28 ENCOUNTER — Other Ambulatory Visit: Payer: Self-pay

## 2017-10-28 DIAGNOSIS — C155 Malignant neoplasm of lower third of esophagus: Secondary | ICD-10-CM

## 2017-10-29 ENCOUNTER — Encounter: Payer: Self-pay | Admitting: Oncology

## 2017-10-29 ENCOUNTER — Inpatient Hospital Stay: Payer: Medicare Other

## 2017-10-29 ENCOUNTER — Other Ambulatory Visit: Payer: Self-pay

## 2017-10-29 ENCOUNTER — Inpatient Hospital Stay: Payer: Medicare Other | Attending: Oncology

## 2017-10-29 ENCOUNTER — Inpatient Hospital Stay (HOSPITAL_BASED_OUTPATIENT_CLINIC_OR_DEPARTMENT_OTHER): Payer: Medicare Other | Admitting: Oncology

## 2017-10-29 VITALS — BP 149/78 | HR 81 | Temp 97.3°F | Resp 12 | Ht 71.0 in | Wt 187.5 lb

## 2017-10-29 DIAGNOSIS — D6959 Other secondary thrombocytopenia: Secondary | ICD-10-CM | POA: Insufficient documentation

## 2017-10-29 DIAGNOSIS — D6481 Anemia due to antineoplastic chemotherapy: Secondary | ICD-10-CM

## 2017-10-29 DIAGNOSIS — C155 Malignant neoplasm of lower third of esophagus: Secondary | ICD-10-CM | POA: Diagnosis present

## 2017-10-29 DIAGNOSIS — Z5111 Encounter for antineoplastic chemotherapy: Secondary | ICD-10-CM

## 2017-10-29 DIAGNOSIS — Z452 Encounter for adjustment and management of vascular access device: Secondary | ICD-10-CM | POA: Diagnosis not present

## 2017-10-29 DIAGNOSIS — T451X5A Adverse effect of antineoplastic and immunosuppressive drugs, initial encounter: Secondary | ICD-10-CM

## 2017-10-29 DIAGNOSIS — C159 Malignant neoplasm of esophagus, unspecified: Secondary | ICD-10-CM

## 2017-10-29 LAB — CBC WITH DIFFERENTIAL/PLATELET
BASOS ABS: 0 10*3/uL (ref 0–0.1)
BASOS PCT: 0 %
Eosinophils Absolute: 0.1 10*3/uL (ref 0–0.7)
Eosinophils Relative: 1 %
HEMATOCRIT: 23.6 % — AB (ref 40.0–52.0)
HEMOGLOBIN: 8 g/dL — AB (ref 13.0–18.0)
LYMPHS PCT: 12 %
Lymphs Abs: 1.6 10*3/uL (ref 1.0–3.6)
MCH: 34.9 pg — ABNORMAL HIGH (ref 26.0–34.0)
MCHC: 33.8 g/dL (ref 32.0–36.0)
MCV: 103.2 fL — ABNORMAL HIGH (ref 80.0–100.0)
MONO ABS: 1 10*3/uL (ref 0.2–1.0)
Monocytes Relative: 8 %
NEUTROS ABS: 10.7 10*3/uL — AB (ref 1.4–6.5)
NEUTROS PCT: 79 %
Platelets: 102 10*3/uL — ABNORMAL LOW (ref 150–440)
RBC: 2.29 MIL/uL — AB (ref 4.40–5.90)
RDW: 15.1 % — ABNORMAL HIGH (ref 11.5–14.5)
WBC: 13.4 10*3/uL — AB (ref 3.8–10.6)

## 2017-10-29 LAB — COMPREHENSIVE METABOLIC PANEL
ALBUMIN: 3.6 g/dL (ref 3.5–5.0)
ALK PHOS: 71 U/L (ref 38–126)
ALT: 30 U/L (ref 0–44)
AST: 41 U/L (ref 15–41)
Anion gap: 10 (ref 5–15)
BILIRUBIN TOTAL: 0.4 mg/dL (ref 0.3–1.2)
BUN: 26 mg/dL — AB (ref 8–23)
CO2: 23 mmol/L (ref 22–32)
CREATININE: 1.2 mg/dL (ref 0.61–1.24)
Calcium: 8.9 mg/dL (ref 8.9–10.3)
Chloride: 108 mmol/L (ref 98–111)
GFR calc Af Amer: >60 mL/min (ref >60)
GFR calc non Af Amer: 55 mL/min — ABNORMAL LOW (ref >60)
GLUCOSE: 133 mg/dL — AB (ref 70–99)
Potassium: 3.8 mmol/L (ref 3.5–5.1)
Sodium: 141 mmol/L (ref 135–145)
TOTAL PROTEIN: 6.5 g/dL (ref 6.5–8.1)

## 2017-10-29 MED ORDER — HEPARIN SOD (PORK) LOCK FLUSH 100 UNIT/ML IV SOLN
500.0000 [IU] | Freq: Once | INTRAVENOUS | Status: AC
  Administered 2017-10-29: 500 [IU] via INTRAVENOUS
  Filled 2017-10-29: qty 5

## 2017-10-29 MED ORDER — SODIUM CHLORIDE 0.9% FLUSH
10.0000 mL | INTRAVENOUS | Status: DC | PRN
  Administered 2017-10-29: 10 mL via INTRAVENOUS
  Filled 2017-10-29: qty 10

## 2017-10-29 NOTE — Progress Notes (Signed)
Patient here for pre treatment check. Patient reports having nose bleeds after he was disconnected.

## 2017-10-29 NOTE — Progress Notes (Signed)
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 He also has a history of multiple back surgeries and chronic back pain but is otherwise healthy and lives with his wife. He is independent of his ADLs. He was seen by [facility] clinic GI in August 2018 for symptoms of epigastric pain and some difficulty swallowing. He denies any change in his appetite or unintentional weight loss. He reports dysphagia to both solids and liquids.   Patient had a barium swallow in September 2018 which showed moderate sized reducible hiatal hernia without evidence of stricture or reflux or esophagitis. More proximally no stricture or spasm was observed. No laryngeal penetration of the barium  He continued to have symptoms and then underwent an upper endoscopy in January 2019 which showed a medium-sized fungating mass with no bleeding found in the distal esophagus 35-37 cm from the incisors. Mass was nonobstructing, partially obstructing and not circumferential. 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 Angiolymphatic invasion was present. Fundic gland polyp was noted in the stomach which was negative for H. pylori dysplasia and malignancy.  Patient underwent PET/CT scan on 05/28/2017 which showed small hypermetabolic mass in the  distal esophagus with an SUV of 10.7. Hypermetabolic left periaortic adenopathy compatible with malignancy. Larger of the 2 involved lymph nodes with a maximum SUV of 16.1. No other regions of tumor are identified.  Tumor marker testing shows he is positive for HER-2 and PDL 1 expressed  Biopsy of the left para-aortic lymph node positive for metastatic esophageal cancer  Patient completed concurrent chemo/RT on 07/24/17. He did miss his last dose of chemotherapy due to neutropenia  Repeat PET/Ct scan showed:IMPRESSION: 1. Today's study demonstrates a positive response to therapy with regression of the primary lesion in the distal esophagus and decreased hypermetabolism in retroperitoneal lymph nodes which are essentially unchanged in size. 2. There is a new focus of hypermetabolism in the proximal stomach just distal to the gastroesophageal junction. This is of uncertain etiology and significance, and could be physiologic, but correlation with endoscopy may be considered.  MUGA scan showed normal EF  Interval history- he feels fatigued but denies other complaints  ECOG PS- 1 Pain scale- 0 Opioid associated constipation- no  Review of systems- Review of Systems  Constitutional: Positive for malaise/fatigue. Negative for chills, fever and weight loss.  HENT: Negative for congestion, ear discharge and nosebleeds.   Eyes: Negative for blurred vision.  Respiratory: Negative for cough, hemoptysis, sputum production, shortness of breath and wheezing.   Cardiovascular: Negative for chest pain, palpitations, orthopnea and claudication.  Gastrointestinal: Negative for abdominal pain, blood in stool, constipation, diarrhea, heartburn, melena, nausea and vomiting.  Genitourinary: Negative for dysuria, flank pain, frequency, hematuria and urgency.  Musculoskeletal: Negative for back pain, joint pain and myalgias.  Skin: Negative for rash.  Neurological: Negative for dizziness,  tingling, focal weakness, seizures, weakness and headaches.  Endo/Heme/Allergies: Does  not bruise/bleed easily.  Psychiatric/Behavioral: Negative for depression and suicidal ideas. The patient does not have insomnia.       Allergies  Allergen Reactions  . Morphine And Related Nausea Only     Past Medical History:  Diagnosis Date  . Anemia   . Arthritis   . Coronary artery disease   . Diverticulitis   . Dysrhythmia   . GERD (gastroesophageal reflux disease)   . Heart murmur   . History of hiatal hernia   . Hyperglycemia   . Hyperlipidemia   . Hypertension   . Hypothyroidism   . Spinal stenosis      Past Surgical History:  Procedure Laterality Date  . BACK SURGERY    . CATARACT EXTRACTION W/PHACO Left 11/08/2014   Procedure: CATARACT EXTRACTION PHACO AND INTRAOCULAR LENS PLACEMENT (IOC);  Surgeon: Estill Cotta, MD;  Location: [facility] ORS;  Service: Ophthalmology;  Laterality: Left;  US:01:20 AP:21.8 CDE:31.09 PAC LOT: [id number]  . CHOLECYSTECTOMY    . ESOPHAGOGASTRODUODENOSCOPY (EGD) WITH PROPOFOL N/A 05/23/2017   Procedure: ESOPHAGOGASTRODUODENOSCOPY (EGD) WITH PROPOFOL;  Surgeon: Lollie Sails, MD;  Location: [facility] ENDOSCOPY;  Service: Endoscopy;  Laterality: N/A;  . EYE SURGERY    . IR FLUORO GUIDE PORT INSERTION RIGHT  06/07/2017  . JOINT REPLACEMENT Right    Knee  . ROTATOR CUFF REPAIR Right     Social History   Socioeconomic History  . Marital status: Married    Spouse name: Not on file  . Number of children: Not on file  . Years of education: Not on file  . Highest education level: Not on file  Occupational History  . Not on file  Social Needs  . Financial resource strain: Not on file  . Food insecurity:    Worry: Not on file    Inability: Not on file  . Transportation needs:    Medical: Not on file    Non-medical: Not on file  Tobacco Use  . Smoking status: Former Smoker    Last attempt to quit: 03/24/1989    Years since quitting: 28.6  .  Smokeless tobacco: Never Used  Substance and Sexual Activity  . Alcohol use: No    Frequency: Never  . Drug use: No  . Sexual activity: Not on file  Lifestyle  . Physical activity:    Days per week: Not on file    Minutes per session: Not on file  . Stress: Not on file  Relationships  . Social connections:    Talks on phone: Not on file    Gets together: Not on file    Attends religious service: Not on file    Active member of club or organization: Not on file    Attends meetings of clubs or organizations: Not on file    Relationship status: Not on file  . Intimate partner violence:    Fear of current or ex partner: Not on file    Emotionally abused: Not on file    Physically abused: Not on file    Forced sexual activity: Not on file  Other Topics Concern  . Not on file  Social History Narrative  . Not on file    Family History  Problem Relation Age of Onset  . Colon cancer Mother      Current Outpatient Medications:  .  acetaminophen (TYLENOL) 650 MG CR tablet, Take 650 mg by mouth every 8 (eight) hours as needed for pain., Disp: , Rfl:  .  BACILLUS COAGULANS-INULIN PO, Take  by mouth., Disp: , Rfl:  .  cholecalciferol (VITAMIN D) 1000 units tablet, Take 1,000 Units by mouth daily., Disp: , Rfl:  .  co-enzyme Q-10 30 MG capsule, Take 30 mg by mouth daily., Disp: , Rfl:  .  docusate sodium (COLACE) 100 MG capsule, Take 100 mg by mouth daily., Disp: , Rfl:  .  levothyroxine (SYNTHROID, LEVOTHROID) 25 MCG tablet, Take 25 mcg by mouth daily before breakfast., Disp: , Rfl:  .  metoprolol tartrate (LOPRESSOR) 25 MG tablet, Take 25 mg by mouth daily., Disp: , Rfl:  .  Multiple Vitamin (MULTIVITAMIN) tablet, Take 1 tablet by mouth daily., Disp: , Rfl:  .  Omega-3 1000 MG CAPS, Take 340-1,000 capsules by mouth 2 (two) times daily., Disp: , Rfl:  .  omeprazole (PRILOSEC) 20 MG capsule, Take 20 mg by mouth daily., Disp: , Rfl:  .  pravastatin (PRAVACHOL) 20 MG tablet, Take 20 mg  by mouth daily., Disp: , Rfl:  .  predniSONE (DELTASONE) 5 MG tablet, Take 5 mg by mouth daily with breakfast., Disp: , Rfl:  .  Probiotic Product (PROBIOTIC COMPLEX ACIDOPHILUS PO), Take by mouth., Disp: , Rfl:  .  quinapril (ACCUPRIL) 40 MG tablet, Take 40 mg by mouth 1 day or 1 dose., Disp: , Rfl:  .  tamsulosin (FLOMAX) 0.4 MG CAPS capsule, Take 0.4 mg by mouth daily., Disp: , Rfl:  .  traMADol (ULTRAM-ER) 100 MG 24 hr tablet, Take 50 mg by mouth 2 (two) times daily. With lunch and dinner , Disp: , Rfl:  .  traMADol (ULTRAM-ER) 300 MG 24 hr tablet, Take 300 mg by mouth daily. AM dose, Disp: , Rfl:  .  vitamin B-12 (CYANOCOBALAMIN) 1000 MCG tablet, Take 1,500 mcg by mouth daily. , Disp: , Rfl:  .  HYDROcodone-acetaminophen (NORCO) 5-325 MG tablet, Take 1 tablet by mouth every 6 (six) hours as needed for moderate pain. (Patient not taking: Reported on 09/10/2017), Disp: 20 tablet, Rfl: 0  Physical exam:  Vitals:   10/29/17 0824 10/29/17 0838  BP:  (!) 149/78  Pulse:  81  Resp: 12   Temp:  (!) 97.3 F (36.3 C)  TempSrc:  Tympanic  Weight: 187 lb 8 oz (85 kg)   Height: '5\' 11"'  (1.803 m)    Physical Exam  Constitutional: He is oriented to person, place, and time. He appears well-developed and well-nourished.  HENT:  Head: Normocephalic and atraumatic.  Eyes: Pupils are equal, round, and reactive to light. EOM are normal.  Neck: Normal range of motion.  Cardiovascular: Normal rate, regular rhythm and normal heart sounds.  Pulmonary/Chest: Effort normal and breath sounds normal.  Abdominal: Soft. Bowel sounds are normal.  Neurological: He is alert and oriented to person, place, and time.  Skin: Skin is warm and dry.     CMP Latest Ref Rng & Units 10/29/2017  Glucose 70 - 99 mg/dL 133(H)  BUN 8 - 23 mg/dL 26(H)  Creatinine 0.61 - 1.24 mg/dL 1.20  Sodium 135 - 145 mmol/L 141  Potassium 3.5 - 5.1 mmol/L 3.8  Chloride 98 - 111 mmol/L 108  CO2 22 - 32 mmol/L 23  Calcium 8.9 - 10.3  mg/dL 8.9  Total Protein 6.5 - 8.1 g/dL 6.5  Total Bilirubin 0.3 - 1.2 mg/dL 0.4  Alkaline Phos 38 - 126 U/L 71  AST 15 - 41 U/L 41  ALT 0 - 44 U/L 30   CBC Latest Ref Rng & Units 10/29/2017  WBC 3.8 - 10.6  K/uL 13.4(H)  Hemoglobin 13.0 - 18.0 g/dL 8.0(L)  Hematocrit 40.0 - 52.0 % 23.6(L)  Platelets 150 - 440 K/uL 102(L)      Assessment and plan- Patient is a 84 y.o. male with stage IV esophageal adenocarcinomacTX N0M1with metastases to the para-aortic lymph nodes/p concurrent chemo/RT with carbo/taxol. He is here for on treatment assessment prior to cycle 3 of FOLFOX herceptin chemotherapy  Platelet counts are down to 102 today. Hb further down to 8. Baseline hb runs around 10. I will hold his chemotherapy today. I will plan to give him FOLFOX herceptin Q3 weeks instead of Q2 weeks due to his pancytopenia. I may eventually have to stop his oxaliplatin if thrombocytopenia worsens.   Repeat cbc with diff in 1 week. If counts are permissible. He will proceed with cycle 4 without neulasta (as we are now doing it q3 weeks).  I will see him 4 weeks from now with cbc/cmp for cycle 5 of FOLFOX herceptin. Scans after 6 cycles.      Visit Diagnosis 1. Malignant neoplasm of lower third of esophagus (HCC)   2. Encounter for antineoplastic chemotherapy   3. Antineoplastic chemotherapy induced anemia   4. Chemotherapy-induced thrombocytopenia      Dr. Randa Evens, MD, MPH [facility] at [facility] [phone] 10/29/2017 1:13 PM

## 2017-11-01 ENCOUNTER — Telehealth: Payer: Self-pay | Admitting: *Deleted

## 2017-11-01 NOTE — Telephone Encounter (Signed)
Per Dr. Elroy Channel request, called patient to let him know that she does want him to keep his appt. On Tuesday for labs and chemotherapy treatment. He stated that he is going to his PCP on Monday to have area looked at again, but that he will keep his appt. With [place] on Tuesday. He wanted make sure that Dr. Janese Banks knew that he is not scheduled to actually see her on Tuesday.       dhs  Per Dr. Janese Banks, she will take a peek at patient on Tuesday even though he is not on the schedule to see her.

## 2017-11-01 NOTE — Telephone Encounter (Signed)
Patient called to report that he developed an abscess on his right buttocks. He is being treated by his PCP with ABT. He wants to know if he should still plan on chemo next Tuesday.

## 2017-11-05 ENCOUNTER — Inpatient Hospital Stay: Payer: Medicare Other

## 2017-11-05 ENCOUNTER — Other Ambulatory Visit: Payer: Self-pay | Admitting: Oncology

## 2017-11-05 ENCOUNTER — Other Ambulatory Visit: Payer: Self-pay

## 2017-11-05 VITALS — BP 168/89 | HR 63 | Temp 94.8°F | Resp 20

## 2017-11-05 DIAGNOSIS — T451X5A Adverse effect of antineoplastic and immunosuppressive drugs, initial encounter: Secondary | ICD-10-CM

## 2017-11-05 DIAGNOSIS — C155 Malignant neoplasm of lower third of esophagus: Secondary | ICD-10-CM

## 2017-11-05 DIAGNOSIS — C159 Malignant neoplasm of esophagus, unspecified: Secondary | ICD-10-CM

## 2017-11-05 DIAGNOSIS — D6481 Anemia due to antineoplastic chemotherapy: Secondary | ICD-10-CM

## 2017-11-05 LAB — IRON AND TIBC
Iron: 41 ug/dL — ABNORMAL LOW (ref 45–182)
SATURATION RATIOS: 13 % — AB (ref 17.9–39.5)
TIBC: 311 ug/dL (ref 250–450)
UIBC: 270 ug/dL

## 2017-11-05 LAB — CBC WITH DIFFERENTIAL/PLATELET
BASOS PCT: 1 %
Basophils Absolute: 0.1 10*3/uL (ref 0–0.1)
EOS ABS: 0.2 10*3/uL (ref 0–0.7)
Eosinophils Relative: 2 %
HCT: 20.8 % — ABNORMAL LOW (ref 40.0–52.0)
HEMOGLOBIN: 7.3 g/dL — AB (ref 13.0–18.0)
Lymphocytes Relative: 10 %
Lymphs Abs: 0.9 10*3/uL — ABNORMAL LOW (ref 1.0–3.6)
MCH: 35.8 pg — ABNORMAL HIGH (ref 26.0–34.0)
MCHC: 35 g/dL (ref 32.0–36.0)
MCV: 102.4 fL — ABNORMAL HIGH (ref 80.0–100.0)
Monocytes Absolute: 0.9 10*3/uL (ref 0.2–1.0)
Monocytes Relative: 10 %
NEUTROS PCT: 77 %
Neutro Abs: 6.8 10*3/uL — ABNORMAL HIGH (ref 1.4–6.5)
PLATELETS: 231 10*3/uL (ref 150–440)
RBC: 2.03 MIL/uL — AB (ref 4.40–5.90)
RDW: 15.3 % — ABNORMAL HIGH (ref 11.5–14.5)
WBC: 8.8 10*3/uL (ref 3.8–10.6)

## 2017-11-05 LAB — FOLATE: Folate: 43 ng/mL (ref >5.9)

## 2017-11-05 LAB — ABO/RH: ABO/RH(D): A POS

## 2017-11-05 LAB — VITAMIN B12: Vitamin B-12: 3352 pg/mL — ABNORMAL HIGH (ref 180–914)

## 2017-11-05 LAB — PREPARE RBC (CROSSMATCH)

## 2017-11-05 LAB — FERRITIN: Ferritin: 181 ng/mL (ref 24–336)

## 2017-11-05 MED ORDER — OXALIPLATIN CHEMO INJECTION 100 MG/20ML
65.0000 mg/m2 | Freq: Once | INTRAVENOUS | Status: DC
  Filled 2017-11-05: qty 26

## 2017-11-05 MED ORDER — ACETAMINOPHEN 325 MG PO TABS: 650.0000 mg | ORAL_TABLET | Freq: Once | ORAL | Status: DC

## 2017-11-05 MED ORDER — PALONOSETRON HCL INJECTION 0.25 MG/5ML: 0.2500 mg | Freq: Once | INTRAVENOUS | Status: DC

## 2017-11-05 MED ORDER — DEXTROSE 5 % IV SOLN
Freq: Once | INTRAVENOUS | Status: DC
  Filled 2017-11-05: qty 1000

## 2017-11-05 MED ORDER — TRASTUZUMAB CHEMO 150 MG IV SOLR: 6.0000 mg/kg | Freq: Once | INTRAVENOUS | Status: DC

## 2017-11-05 MED ORDER — ACETAMINOPHEN 325 MG PO TABS
ORAL_TABLET | ORAL | Status: AC
  Filled 2017-11-05: qty 2

## 2017-11-05 MED ORDER — DIPHENHYDRAMINE HCL 25 MG PO CAPS
50.0000 mg | ORAL_CAPSULE | Freq: Once | ORAL | Status: DC
Start: 2017-11-05 — End: 2017-11-05

## 2017-11-05 MED ORDER — SODIUM CHLORIDE 0.9 % IV SOLN
250.0000 mL | Freq: Once | INTRAVENOUS | Status: AC
  Administered 2017-11-05: 250 mL via INTRAVENOUS
  Filled 2017-11-05: qty 250

## 2017-11-05 MED ORDER — SODIUM CHLORIDE 0.9 % IV SOLN
2400.0000 mg/m2 | INTRAVENOUS | Status: DC
  Filled 2017-11-05: qty 96

## 2017-11-05 MED ORDER — LEUCOVORIN CALCIUM INJECTION 350 MG
400.0000 mg/m2 | Freq: Once | INTRAVENOUS | Status: DC
  Filled 2017-11-05: qty 40

## 2017-11-05 MED ORDER — DEXAMETHASONE SODIUM PHOSPHATE 100 MG/10ML IJ SOLN: 10.0000 mg | Freq: Once | INTRAMUSCULAR | Status: DC

## 2017-11-05 MED ORDER — FLUOROURACIL CHEMO INJECTION 2.5 GM/50ML
400.0000 mg/m2 | Freq: Once | INTRAVENOUS | Status: DC
  Filled 2017-11-05: qty 16

## 2017-11-05 MED ORDER — HEPARIN SOD (PORK) LOCK FLUSH 100 UNIT/ML IV SOLN
500.0000 [IU] | Freq: Once | INTRAVENOUS | Status: AC
  Administered 2017-11-05: 500 [IU] via INTRAVENOUS

## 2017-11-05 MED ORDER — DEXAMETHASONE SODIUM PHOSPHATE 10 MG/ML IJ SOLN: 10.0000 mg | Freq: Once | INTRAMUSCULAR | Status: DC

## 2017-11-05 MED ORDER — ACETAMINOPHEN 325 MG PO TABS
650.0000 mg | ORAL_TABLET | Freq: Once | ORAL | Status: AC
  Administered 2017-11-05: 650 mg via ORAL

## 2017-11-05 MED ORDER — SODIUM CHLORIDE 0.9% FLUSH
10.0000 mL | INTRAVENOUS | Status: DC | PRN
  Filled 2017-11-05: qty 10

## 2017-11-05 MED ORDER — SODIUM CHLORIDE 0.9 % IV SOLN
Freq: Once | INTRAVENOUS | Status: DC
  Filled 2017-11-05: qty 1000

## 2017-11-05 NOTE — Progress Notes (Signed)
hbg 7.3 today. Per Dr Janese Banks proceed with treatment, plan to give RBC's later in the week.  Patient voiced concerns over getting chemo treatment today. Dr Janese Banks at chairside to see patient. Discussed options and patient is not going to receive FOLFOX and Herceptin today. Pharmacy notified. Patient to receive 1 unit pRBCs today.

## 2017-11-06 LAB — TYPE AND SCREEN
ABO/RH(D): A POS
Antibody Screen: NEGATIVE
Unit division: 0

## 2017-11-06 LAB — BPAM RBC
Blood Product Expiration Date: 201907312359
ISSUE DATE / TIME: 201907161035
Unit Type and Rh: 6200

## 2017-11-07 ENCOUNTER — Inpatient Hospital Stay: Payer: Medicare Other

## 2017-11-26 ENCOUNTER — Inpatient Hospital Stay: Payer: Medicare Other | Attending: Oncology

## 2017-11-26 ENCOUNTER — Encounter: Payer: Self-pay | Admitting: Oncology

## 2017-11-26 ENCOUNTER — Inpatient Hospital Stay (HOSPITAL_BASED_OUTPATIENT_CLINIC_OR_DEPARTMENT_OTHER): Payer: Medicare Other | Admitting: Oncology

## 2017-11-26 ENCOUNTER — Inpatient Hospital Stay: Payer: Medicare Other

## 2017-11-26 VITALS — BP 104/64 | HR 58 | Resp 18

## 2017-11-26 VITALS — BP 133/81 | HR 84 | Temp 97.4°F | Resp 18 | Ht 71.0 in | Wt 182.3 lb

## 2017-11-26 DIAGNOSIS — Z923 Personal history of irradiation: Secondary | ICD-10-CM | POA: Diagnosis not present

## 2017-11-26 DIAGNOSIS — C771 Secondary and unspecified malignant neoplasm of intrathoracic lymph nodes: Secondary | ICD-10-CM

## 2017-11-26 DIAGNOSIS — Z5111 Encounter for antineoplastic chemotherapy: Secondary | ICD-10-CM

## 2017-11-26 DIAGNOSIS — C155 Malignant neoplasm of lower third of esophagus: Secondary | ICD-10-CM

## 2017-11-26 DIAGNOSIS — C159 Malignant neoplasm of esophagus, unspecified: Secondary | ICD-10-CM

## 2017-11-26 DIAGNOSIS — Z9221 Personal history of antineoplastic chemotherapy: Secondary | ICD-10-CM

## 2017-11-26 DIAGNOSIS — T451X5A Adverse effect of antineoplastic and immunosuppressive drugs, initial encounter: Secondary | ICD-10-CM

## 2017-11-26 DIAGNOSIS — D6481 Anemia due to antineoplastic chemotherapy: Secondary | ICD-10-CM | POA: Diagnosis not present

## 2017-11-26 LAB — CBC WITH DIFFERENTIAL/PLATELET
BASOS PCT: 1 %
Basophils Absolute: 0 10*3/uL (ref 0–0.1)
EOS ABS: 0.3 10*3/uL (ref 0–0.7)
Eosinophils Relative: 4 %
HCT: 23.9 % — ABNORMAL LOW (ref 40.0–52.0)
HEMOGLOBIN: 8.1 g/dL — AB (ref 13.0–18.0)
Lymphocytes Relative: 14 %
Lymphs Abs: 0.9 10*3/uL — ABNORMAL LOW (ref 1.0–3.6)
MCH: 32.7 pg (ref 26.0–34.0)
MCHC: 33.9 g/dL (ref 32.0–36.0)
MCV: 96.3 fL (ref 80.0–100.0)
Monocytes Absolute: 0.8 10*3/uL (ref 0.2–1.0)
Monocytes Relative: 11 %
NEUTROS PCT: 70 %
Neutro Abs: 4.8 10*3/uL (ref 1.4–6.5)
PLATELETS: 272 10*3/uL (ref 150–440)
RBC: 2.48 MIL/uL — AB (ref 4.40–5.90)
RDW: 17.7 % — ABNORMAL HIGH (ref 11.5–14.5)
WBC: 6.8 10*3/uL (ref 3.8–10.6)

## 2017-11-26 LAB — COMPREHENSIVE METABOLIC PANEL
ALK PHOS: 51 U/L (ref 38–126)
ALT: 15 U/L (ref 0–44)
AST: 29 U/L (ref 15–41)
Albumin: 3.6 g/dL (ref 3.5–5.0)
Anion gap: 9 (ref 5–15)
BILIRUBIN TOTAL: 0.4 mg/dL (ref 0.3–1.2)
BUN: 33 mg/dL — ABNORMAL HIGH (ref 8–23)
CALCIUM: 9.3 mg/dL (ref 8.9–10.3)
CO2: 23 mmol/L (ref 22–32)
CREATININE: 1.21 mg/dL (ref 0.61–1.24)
Chloride: 106 mmol/L (ref 98–111)
GFR calc non Af Amer: 54 mL/min — ABNORMAL LOW (ref >60)
Glucose, Bld: 144 mg/dL — ABNORMAL HIGH (ref 70–99)
Potassium: 4.1 mmol/L (ref 3.5–5.1)
SODIUM: 138 mmol/L (ref 135–145)
Total Protein: 6.9 g/dL (ref 6.5–8.1)

## 2017-11-26 MED ORDER — SODIUM CHLORIDE 0.9 % IV SOLN
Freq: Once | INTRAVENOUS | Status: AC
  Administered 2017-11-26: 10:00:00 via INTRAVENOUS
  Filled 2017-11-26: qty 1000

## 2017-11-26 MED ORDER — HEPARIN SOD (PORK) LOCK FLUSH 100 UNIT/ML IV SOLN
500.0000 [IU] | Freq: Once | INTRAVENOUS | Status: AC
  Administered 2017-11-26: 500 [IU] via INTRAVENOUS
  Filled 2017-11-26: qty 5

## 2017-11-26 MED ORDER — SODIUM CHLORIDE 0.9 % IV SOLN
510.0000 mg | Freq: Once | INTRAVENOUS | Status: AC
  Administered 2017-11-26: 510 mg via INTRAVENOUS
  Filled 2017-11-26: qty 17

## 2017-11-26 MED ORDER — SODIUM CHLORIDE 0.9% FLUSH
10.0000 mL | INTRAVENOUS | Status: DC | PRN
  Administered 2017-11-26: 10 mL via INTRAVENOUS
  Filled 2017-11-26: qty 10

## 2017-11-26 NOTE — Progress Notes (Signed)
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 Mass was nonobstructing, partially obstructing and not circumferential. 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 Angiolymphatic invasion was present. Fundic gland polyp was noted in the stomach which was negative for H. pylori dysplasia and malignancy.  Patient underwent PET/CT scan on 05/28/2017 which showed small hypermetabolic mass in the distal esophagus with an SUV of 10.7. Hypermetabolic left periaortic adenopathy compatible with malignancy. Larger of the 2 involved lymph nodes with a maximum SUV of 16.1. No other regions of tumor are identified.  Tumor marker testing shows he is positive for HER-2 and PDL 1 expressed  Biopsy of the left para-aortic lymph node positive for metastatic esophageal cancer  Patient completed concurrent chemo/RT on 07/24/17. He did miss his last dose of chemotherapy due to neutropenia  Repeat PET/Ct scan showed:IMPRESSION: 1. Today's study demonstrates a positive response to therapy with regression of the primary lesion in the distal esophagus and decreased hypermetabolism in retroperitoneal lymph nodes which are essentially unchanged in size. 2. There is a new  focus of hypermetabolism in the proximal stomach just distal to the gastroesophageal junction. This is of uncertain etiology and significance, and could be physiologic, but correlation with endoscopy may be considered.  MUGA scan showed normal EF   Chemotherapy has been on hold since 10/15/17 due to persistent anemia requiring blood transfusions. He only received 2 cycles thus far  Interval history- he feels fatigued. Denies other complaints. Denies nausea, vomiting or diarrhea  ECOG PS- 1 Pain scale- 0 Opioid associated constipation- no  Review of systems- Review of Systems  Constitutional: Positive for malaise/fatigue. Negative for chills, fever and weight loss.  HENT: Negative for congestion, ear discharge and nosebleeds.   Eyes: Negative for blurred vision.  Respiratory: Negative for cough, hemoptysis, sputum production, shortness of breath and wheezing.   Cardiovascular: Negative for chest pain, palpitations, orthopnea and claudication.  Gastrointestinal: Negative for abdominal pain, blood in stool, constipation, diarrhea, heartburn, melena, nausea and vomiting.  Genitourinary: Negative for dysuria, flank pain, frequency, hematuria and urgency.  Musculoskeletal: Negative for back pain, joint pain and myalgias.  Skin: Negative for rash.  Neurological: Negative for dizziness, tingling, focal weakness, seizures, weakness and headaches.  Endo/Heme/Allergies: Does not bruise/bleed easily.  Psychiatric/Behavioral: Negative for depression and suicidal ideas. The patient does not have insomnia.       Allergies  Allergen Reactions  . Morphine And Related Nausea Only     Past Medical History:  Diagnosis Date  . Anemia   . Arthritis   . Coronary artery disease   . Diverticulitis   . Dysrhythmia   . GERD (gastroesophageal reflux disease)   . Heart murmur   . History of hiatal hernia   . Hyperglycemia   . Hyperlipidemia   . Hypertension   . Hypothyroidism   . Spinal  stenosis      Past Surgical History:  Procedure Laterality Date  . BACK SURGERY    . CATARACT EXTRACTION W/PHACO Left 11/08/2014   Procedure: CATARACT EXTRACTION PHACO AND INTRAOCULAR LENS PLACEMENT (IOC);  Surgeon: Estill Cotta, MD;  Location: [facility] ORS;  Service: Ophthalmology;  Laterality: Left;  US:01:20 AP:21.8 CDE:31.09 PAC LOT: [id number]  . CHOLECYSTECTOMY    . ESOPHAGOGASTRODUODENOSCOPY (EGD) WITH PROPOFOL N/A 05/23/2017   Procedure: ESOPHAGOGASTRODUODENOSCOPY (EGD) WITH PROPOFOL;  Surgeon: Lollie Sails, MD;  Location: [facility] ENDOSCOPY;  Service: Endoscopy;  Laterality: N/A;  . EYE SURGERY    . IR FLUORO GUIDE PORT INSERTION RIGHT  06/07/2017  . JOINT REPLACEMENT Right    Knee  . ROTATOR CUFF REPAIR Right     Social History   Socioeconomic History  . Marital status: Married    Spouse name: Not on file  . Number of children: Not on file  . Years of education: Not on file  . Highest education level: Not on file  Occupational History  . Not on file  Social Needs  . Financial resource strain: Not on file  . Food insecurity:    Worry: Not on file    Inability: Not on file  . Transportation needs:    Medical: Not on file    Non-medical: Not on file  Tobacco Use  . Smoking status: Former Smoker    Last attempt to quit: 03/24/1989    Years since quitting: 28.6  . Smokeless tobacco: Never Used  Substance and Sexual Activity  . Alcohol use: No    Frequency: Never  . Drug use: No  . Sexual activity: Not on file  Lifestyle  . Physical activity:    Days per week: Not on file    Minutes per session: Not on file  . Stress: Not on file  Relationships  . Social connections:    Talks on phone: Not on file    Gets together: Not on file    Attends religious service: Not on file    Active member of club or organization: Not on file    Attends meetings of clubs or organizations: Not on file    Relationship status: Not on file  . Intimate partner violence:    Fear  of current or ex partner: Not on file    Emotionally abused: Not on file    Physically abused: Not on file    Forced sexual activity: Not on file  Other Topics Concern  . Not on file  Social History Narrative  . Not on file    Family History  Problem Relation Age of Onset  . Colon cancer Mother      Current Outpatient Medications:  .  BACILLUS COAGULANS-INULIN PO, Take by mouth., Disp: , Rfl:  .  cholecalciferol (VITAMIN D) 1000 units tablet, Take 1,000 Units by mouth daily., Disp: , Rfl:  .  co-enzyme Q-10 30 MG capsule, Take 30 mg by mouth daily., Disp: , Rfl:  .  docusate sodium (COLACE) 100 MG capsule, Take 100 mg by mouth daily., Disp: , Rfl:  .  levothyroxine (SYNTHROID, LEVOTHROID) 25 MCG tablet, Take 25 mcg by mouth daily before breakfast., Disp: , Rfl:  .  metoprolol tartrate (LOPRESSOR) 25 MG tablet, Take 25 mg by mouth daily., Disp: , Rfl:  .  Multiple Vitamin (MULTIVITAMIN) tablet, Take 1 tablet by mouth daily., Disp: , Rfl:  .  Omega-3 1000 MG CAPS, Take 340-1,000 capsules by mouth 2 (two) times daily., Disp: ,  Rfl:  .  omeprazole (PRILOSEC) 20 MG capsule, Take 20 mg by mouth daily., Disp: , Rfl:  .  pravastatin (PRAVACHOL) 20 MG tablet, Take 20 mg by mouth daily., Disp: , Rfl:  .  Probiotic Product (PROBIOTIC COMPLEX ACIDOPHILUS PO), Take by mouth., Disp: , Rfl:  .  quinapril (ACCUPRIL) 40 MG tablet, Take 40 mg by mouth 1 day or 1 dose., Disp: , Rfl:  .  tamsulosin (FLOMAX) 0.4 MG CAPS capsule, Take 0.4 mg by mouth daily., Disp: , Rfl:  .  traMADol (ULTRAM-ER) 100 MG 24 hr tablet, Take 50 mg by mouth 2 (two) times daily. With lunch and dinner , Disp: , Rfl:  .  traMADol (ULTRAM-ER) 300 MG 24 hr tablet, Take 300 mg by mouth daily. AM dose, Disp: , Rfl:  .  vitamin B-12 (CYANOCOBALAMIN) 1000 MCG tablet, Take 1,500 mcg by mouth daily. , Disp: , Rfl:  .  acetaminophen (TYLENOL) 650 MG CR tablet, Take 650 mg by mouth every 8 (eight) hours as needed for pain., Disp: , Rfl:    .  HYDROcodone-acetaminophen (NORCO) 5-325 MG tablet, Take 1 tablet by mouth every 6 (six) hours as needed for moderate pain. (Patient not taking: Reported on 09/10/2017), Disp: 20 tablet, Rfl: 0 .  predniSONE (DELTASONE) 5 MG tablet, Take 5 mg by mouth daily with breakfast., Disp: , Rfl:  No current facility-administered medications for this visit.   Facility-Administered Medications Ordered in Other Visits:  .  sodium chloride flush (NS) 0.9 % injection 10 mL, 10 mL, Intravenous, PRN, Sindy Guadeloupe, MD, 10 mL at 11/26/17 0813  Physical exam:  Vitals:   11/26/17 0827  BP: 133/81  Pulse: 84  Resp: 18  Temp: (!) 97.4 F (36.3 C)  SpO2: 97%  Weight: 182 lb 4.8 oz (82.7 kg)  Height: '5\' 11"'  (1.803 m)   Physical Exam  Constitutional: He is oriented to person, place, and time. He appears well-developed and well-nourished.  HENT:  Head: Normocephalic and atraumatic.  Eyes: Pupils are equal, round, and reactive to light. EOM are normal.  Neck: Normal range of motion.  Cardiovascular: Normal rate, regular rhythm and normal heart sounds.  Pulmonary/Chest: Effort normal and breath sounds normal.  Abdominal: Soft. Bowel sounds are normal.  Neurological: He is alert and oriented to person, place, and time.  Skin: Skin is warm and dry.     CMP Latest Ref Rng & Units 11/26/2017  Glucose 70 - 99 mg/dL 144(H)  BUN 8 - 23 mg/dL 33(H)  Creatinine 0.61 - 1.24 mg/dL 1.21  Sodium 135 - 145 mmol/L 138  Potassium 3.5 - 5.1 mmol/L 4.1  Chloride 98 - 111 mmol/L 106  CO2 22 - 32 mmol/L 23  Calcium 8.9 - 10.3 mg/dL 9.3  Total Protein 6.5 - 8.1 g/dL 6.9  Total Bilirubin 0.3 - 1.2 mg/dL 0.4  Alkaline Phos 38 - 126 U/L 51  AST 15 - 41 U/L 29  ALT 0 - 44 U/L 15   CBC Latest Ref Rng & Units 11/26/2017  WBC 3.8 - 10.6 K/uL 6.8  Hemoglobin 13.0 - 18.0 g/dL 8.1(L)  Hematocrit 40.0 - 52.0 % 23.9(L)  Platelets 150 - 440 K/uL 272      Assessment and plan- Patient is a 84 y.o. male with stage IV  esophageal adenocarcinomacTX N0M1with metastases to the para-aortic lymph nodes/p concurrent chemo/RT with carbo/taxol.  He is here for on treatment assessment prior to cycle #3 of FOLFOX Herceptin  Patient last chemotherapy which was  a second cycle of FOLFOX and Herceptin was on 03/01/202019.  Since then his chemotherapy has been hold due to significant anemia and he has required blood transfusions in the past.  His baseline hemoglobin runs around 10 and is down to 8 now with 2 cycles of chemotherapy.  Besides anemia there have been no other issues tolerating chemo.  However given his age and poor bone marrow reserve I would like to hold off on FOLFOX chemotherapy at this time given that the only site of his metastatic disease was the para-aortic lymph node.  Although recent iron studies were almost normal his iron saturation was low at 13% and serum iron was borderline low at 41.  I will therefore proceed with 2 doses of Feraheme and see if that leads to any improvement in his anemia.  Discussed risks and benefits of Feraheme including all but not limited to headache, leg swelling and possible risk of infusion reaction.  Patient understands and agrees to proceed.  Patient will get his first dose of Feraheme today and second dose next week.  He will be getting repeat CT chest abdomen and pelvis with contrast 2 weeks from now and I will see him a few days after that to discuss results of his CT scan and further management.  Check CBC CMP and CEA at that time if there is any evidence of disease progression potential treatments that could be considered at that time would be immunotherapy given that he had PDL expression on his tumor as well as Cyramza as potential chemo alternatives.     Visit Diagnosis 1. Malignant neoplasm of lower third of esophagus (HCC)   2. Antineoplastic chemotherapy induced anemia      Dr. Randa Evens, MD, MPH [facility] [phone] 11/26/2017 12:31 PM

## 2017-11-26 NOTE — Progress Notes (Signed)
No new changes noted today 

## 2017-11-27 LAB — CEA: CEA1: 0.8 ng/mL (ref 0.0–4.7)

## 2017-11-28 ENCOUNTER — Inpatient Hospital Stay: Payer: Medicare Other

## 2017-12-03 ENCOUNTER — Ambulatory Visit: Payer: Medicare Other | Admitting: Oncology

## 2017-12-03 ENCOUNTER — Inpatient Hospital Stay: Payer: Medicare Other

## 2017-12-03 ENCOUNTER — Other Ambulatory Visit: Payer: Medicare Other

## 2017-12-03 VITALS — BP 118/72 | HR 66 | Resp 20

## 2017-12-03 DIAGNOSIS — C159 Malignant neoplasm of esophagus, unspecified: Secondary | ICD-10-CM | POA: Diagnosis not present

## 2017-12-03 DIAGNOSIS — Z5111 Encounter for antineoplastic chemotherapy: Secondary | ICD-10-CM

## 2017-12-03 MED ORDER — SODIUM CHLORIDE 0.9 % IV SOLN
510.0000 mg | Freq: Once | INTRAVENOUS | Status: AC
  Administered 2017-12-03: 510 mg via INTRAVENOUS
  Filled 2017-12-03: qty 17

## 2017-12-03 MED ORDER — SODIUM CHLORIDE 0.9 % IV SOLN
Freq: Once | INTRAVENOUS | Status: AC
  Administered 2017-12-03: 14:00:00 via INTRAVENOUS
  Filled 2017-12-03: qty 1000

## 2017-12-03 MED ORDER — HEPARIN SOD (PORK) LOCK FLUSH 100 UNIT/ML IV SOLN
500.0000 [IU] | Freq: Once | INTRAVENOUS | Status: AC | PRN
  Administered 2017-12-03: 500 [IU]

## 2017-12-09 ENCOUNTER — Ambulatory Visit
Admission: RE | Admit: 2017-12-09 | Discharge: 2017-12-09 | Disposition: A | Discharge status: Home / Self Care | Payer: Medicare Other | Source: Ambulatory Visit | Attending: Oncology | Admitting: Oncology

## 2017-12-09 DIAGNOSIS — C155 Malignant neoplasm of lower third of esophagus: Secondary | ICD-10-CM

## 2017-12-09 DIAGNOSIS — J439 Emphysema, unspecified: Secondary | ICD-10-CM | POA: Insufficient documentation

## 2017-12-09 DIAGNOSIS — I700 Atherosclerosis of aorta: Secondary | ICD-10-CM | POA: Diagnosis not present

## 2017-12-09 DIAGNOSIS — I2510 Atherosclerotic heart disease of native coronary artery without angina pectoris: Secondary | ICD-10-CM | POA: Diagnosis not present

## 2017-12-09 HISTORY — DX: Malignant neoplasm of esophagus, unspecified: C15.9

## 2017-12-09 MED ORDER — IOPAMIDOL (ISOVUE-300) INJECTION 61%
100.0000 mL | Freq: Once | INTRAVENOUS | Status: AC | PRN
  Administered 2017-12-09: 100 mL via INTRAVENOUS

## 2017-12-10 ENCOUNTER — Inpatient Hospital Stay: Payer: Medicare Other

## 2017-12-10 ENCOUNTER — Inpatient Hospital Stay (HOSPITAL_BASED_OUTPATIENT_CLINIC_OR_DEPARTMENT_OTHER): Payer: Medicare Other | Admitting: Oncology

## 2017-12-10 ENCOUNTER — Encounter: Payer: Self-pay | Admitting: Oncology

## 2017-12-10 VITALS — BP 142/73 | HR 71 | Temp 97.0°F | Resp 18 | Ht 71.0 in | Wt 179.2 lb

## 2017-12-10 DIAGNOSIS — C155 Malignant neoplasm of lower third of esophagus: Secondary | ICD-10-CM

## 2017-12-10 DIAGNOSIS — C771 Secondary and unspecified malignant neoplasm of intrathoracic lymph nodes: Secondary | ICD-10-CM

## 2017-12-10 DIAGNOSIS — C159 Malignant neoplasm of esophagus, unspecified: Secondary | ICD-10-CM | POA: Diagnosis not present

## 2017-12-10 DIAGNOSIS — D6481 Anemia due to antineoplastic chemotherapy: Secondary | ICD-10-CM | POA: Diagnosis not present

## 2017-12-10 DIAGNOSIS — Z923 Personal history of irradiation: Secondary | ICD-10-CM

## 2017-12-10 DIAGNOSIS — Z9221 Personal history of antineoplastic chemotherapy: Secondary | ICD-10-CM | POA: Diagnosis not present

## 2017-12-10 DIAGNOSIS — T451X5A Adverse effect of antineoplastic and immunosuppressive drugs, initial encounter: Secondary | ICD-10-CM

## 2017-12-10 LAB — CBC WITH DIFFERENTIAL/PLATELET
BASOS ABS: 0 10*3/uL (ref 0–0.1)
BASOS PCT: 0 %
Eosinophils Absolute: 0.2 10*3/uL (ref 0–0.7)
Eosinophils Relative: 3 %
HEMATOCRIT: 29.1 % — AB (ref 40.0–52.0)
Hemoglobin: 9.6 g/dL — ABNORMAL LOW (ref 13.0–18.0)
LYMPHS PCT: 11 %
Lymphs Abs: 0.7 10*3/uL — ABNORMAL LOW (ref 1.0–3.6)
MCH: 33.1 pg (ref 26.0–34.0)
MCHC: 32.9 g/dL (ref 32.0–36.0)
MCV: 100.4 fL — AB (ref 80.0–100.0)
MONO ABS: 0.7 10*3/uL (ref 0.2–1.0)
Monocytes Relative: 11 %
NEUTROS ABS: 4.7 10*3/uL (ref 1.4–6.5)
Neutrophils Relative %: 75 %
PLATELETS: 199 10*3/uL (ref 150–440)
RBC: 2.9 MIL/uL — AB (ref 4.40–5.90)
RDW: 18 % — AB (ref 11.5–14.5)
WBC: 6.3 10*3/uL (ref 3.8–10.6)

## 2017-12-10 LAB — COMPREHENSIVE METABOLIC PANEL
ALK PHOS: 57 U/L (ref 38–126)
ALT: 29 U/L (ref 0–44)
ANION GAP: 8 (ref 5–15)
AST: 38 U/L (ref 15–41)
Albumin: 4 g/dL (ref 3.5–5.0)
BILIRUBIN TOTAL: 0.7 mg/dL (ref 0.3–1.2)
BUN: 22 mg/dL (ref 8–23)
CALCIUM: 9.5 mg/dL (ref 8.9–10.3)
CO2: 25 mmol/L (ref 22–32)
Chloride: 108 mmol/L (ref 98–111)
Creatinine, Ser: 1.14 mg/dL (ref 0.61–1.24)
GFR calc non Af Amer: 58 mL/min — ABNORMAL LOW (ref >60)
Glucose, Bld: 125 mg/dL — ABNORMAL HIGH (ref 70–99)
POTASSIUM: 5 mmol/L (ref 3.5–5.1)
Sodium: 141 mmol/L (ref 135–145)
TOTAL PROTEIN: 6.9 g/dL (ref 6.5–8.1)

## 2017-12-10 NOTE — Progress Notes (Signed)
No new changes noted today 

## 2017-12-11 LAB — CEA: CEA: 1.1 ng/mL (ref 0.0–4.7)

## 2017-12-12 NOTE — Progress Notes (Signed)
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 Mass was nonobstructing, partially obstructing and not circumferential. 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 Angiolymphatic invasion was present. Fundic gland polyp was noted in the stomach which was negative for H. pylori dysplasia and malignancy.  Patient underwent PET/CT scan on 05/28/2017 which showed small hypermetabolic mass in the distal esophagus with an SUV of 10.7. Hypermetabolic left periaortic adenopathy compatible with malignancy. Larger of the 2 involved lymph nodes with a maximum SUV of 16.1. No other regions of tumor are identified.  Tumor marker testing shows he is positive for HER-2 and PDL 1 expressed  Biopsy of the left para-aortic lymph node positive for metastatic esophageal cancer  Patient completed concurrent chemo/RT on 07/24/17. He did miss his last dose of chemotherapy due to neutropenia  Repeat PET/Ct scan showed:IMPRESSION: 1. Today's study demonstrates a positive response to therapy with regression of the primary lesion in the distal esophagus and decreased hypermetabolism in retroperitoneal lymph nodes which are essentially unchanged in size. 2. There is a new focus of  hypermetabolism in the proximal stomach just distal to the gastroesophageal junction. This is of uncertain etiology and significance, and could be physiologic, but correlation with endoscopy may be considered.  MUGA scan showed normal EF   Chemotherapy has been on hold since 10/15/17 due to persistent anemia requiring blood transfusions. He only received 2 cycles thus far  Interval history-he feels well today and reports that his energy levels are better after he received 2 doses of IV iron.  Denies any difficulty in swallowing or other complaints.  ECOG PS- 1 Pain scale- 0 Opioid associated constipation- no  Review of systems- Review of Systems  Constitutional: Positive for malaise/fatigue. Negative for chills, fever and weight loss.  HENT: Negative for congestion, ear discharge and nosebleeds.   Eyes: Negative for blurred vision.  Respiratory: Negative for cough, hemoptysis, sputum production, shortness of breath and wheezing.   Cardiovascular: Negative for chest pain, palpitations, orthopnea and claudication.  Gastrointestinal: Negative for abdominal pain, blood in stool, constipation, diarrhea, heartburn, melena, nausea and vomiting.  Genitourinary: Negative for dysuria, flank pain, frequency, hematuria and urgency.  Musculoskeletal: Negative for back pain, joint pain and myalgias.  Skin: Negative for rash.  Neurological: Negative for dizziness, tingling, focal weakness, seizures, weakness and headaches.  Endo/Heme/Allergies: Does not bruise/bleed easily.  Psychiatric/Behavioral: Negative for depression and suicidal ideas. The patient does not have insomnia.       Allergies  Allergen Reactions  . Morphine And Related Nausea Only     Past Medical History:  Diagnosis Date  . Anemia   . Arthritis   . Coronary artery disease   . Diverticulitis   . Dysrhythmia   . Esophageal cancer ([place]) 05/2017   Chemo + rad tx's.   Marland Kitchen GERD (gastroesophageal reflux disease)   . Heart  murmur   .  History of hiatal hernia   . Hyperglycemia   . Hyperlipidemia   . Hypertension   . Hypothyroidism   . Spinal stenosis      Past Surgical History:  Procedure Laterality Date  . BACK SURGERY    . CATARACT EXTRACTION W/PHACO Left 11/08/2014   Procedure: CATARACT EXTRACTION PHACO AND INTRAOCULAR LENS PLACEMENT (IOC);  Surgeon: Estill Cotta, MD;  Location: [facility] ORS;  Service: Ophthalmology;  Laterality: Left;  US:01:20 AP:21.8 CDE:31.09 PAC LOT: [id number]  . CHOLECYSTECTOMY    . ESOPHAGOGASTRODUODENOSCOPY (EGD) WITH PROPOFOL N/A 05/23/2017   Procedure: ESOPHAGOGASTRODUODENOSCOPY (EGD) WITH PROPOFOL;  Surgeon: Lollie Sails, MD;  Location: [facility] ENDOSCOPY;  Service: Endoscopy;  Laterality: N/A;  . EYE SURGERY    . IR FLUORO GUIDE PORT INSERTION RIGHT  06/07/2017  . JOINT REPLACEMENT Right    Knee  . ROTATOR CUFF REPAIR Right     Social History   Socioeconomic History  . Marital status: Married    Spouse name: Not on file  . Number of children: Not on file  . Years of education: Not on file  . Highest education level: Not on file  Occupational History  . Not on file  Social Needs  . Financial resource strain: Not on file  . Food insecurity:    Worry: Not on file    Inability: Not on file  . Transportation needs:    Medical: Not on file    Non-medical: Not on file  Tobacco Use  . Smoking status: Former Smoker    Last attempt to quit: 03/24/1989    Years since quitting: 28.7  . Smokeless tobacco: Never Used  Substance and Sexual Activity  . Alcohol use: No    Frequency: Never  . Drug use: No  . Sexual activity: Not on file  Lifestyle  . Physical activity:    Days per week: Not on file    Minutes per session: Not on file  . Stress: Not on file  Relationships  . Social connections:    Talks on phone: Not on file    Gets together: Not on file    Attends religious service: Not on file    Active member of club or organization: Not on file     Attends meetings of clubs or organizations: Not on file    Relationship status: Not on file  . Intimate partner violence:    Fear of current or ex partner: Not on file    Emotionally abused: Not on file    Physically abused: Not on file    Forced sexual activity: Not on file  Other Topics Concern  . Not on file  Social History Narrative  . Not on file    Family History  Problem Relation Age of Onset  . Colon cancer Mother      Current Outpatient Medications:  .  BACILLUS COAGULANS-INULIN PO, Take by mouth., Disp: , Rfl:  .  cholecalciferol (VITAMIN D) 1000 units tablet, Take 1,000 Units by mouth daily., Disp: , Rfl:  .  co-enzyme Q-10 30 MG capsule, Take 30 mg by mouth daily., Disp: , Rfl:  .  docusate sodium (COLACE) 100 MG capsule, Take 100 mg by mouth daily., Disp: , Rfl:  .  levothyroxine (SYNTHROID, LEVOTHROID) 25 MCG tablet, Take 25 mcg by mouth daily before breakfast., Disp: , Rfl:  .  metoprolol tartrate (LOPRESSOR) 25 MG tablet, Take 25 mg by mouth daily., Disp: , Rfl:  .  Multiple Vitamin (MULTIVITAMIN) tablet, Take 1  tablet by mouth daily., Disp: , Rfl:  .  Omega-3 1000 MG CAPS, Take 340-1,000 capsules by mouth 2 (two) times daily., Disp: , Rfl:  .  omeprazole (PRILOSEC) 20 MG capsule, Take 20 mg by mouth daily., Disp: , Rfl:  .  pravastatin (PRAVACHOL) 20 MG tablet, Take 20 mg by mouth daily., Disp: , Rfl:  .  predniSONE (DELTASONE) 5 MG tablet, Take 5 mg by mouth daily with breakfast., Disp: , Rfl:  .  Probiotic Product (PROBIOTIC COMPLEX ACIDOPHILUS PO), Take by mouth., Disp: , Rfl:  .  quinapril (ACCUPRIL) 40 MG tablet, Take 40 mg by mouth 1 day or 1 dose., Disp: , Rfl:  .  tamsulosin (FLOMAX) 0.4 MG CAPS capsule, Take 0.4 mg by mouth daily., Disp: , Rfl:  .  traMADol (ULTRAM-ER) 100 MG 24 hr tablet, Take 50 mg by mouth 2 (two) times daily. With lunch and dinner , Disp: , Rfl:  .  traMADol (ULTRAM-ER) 300 MG 24 hr tablet, Take 300 mg by mouth daily. AM dose, Disp: ,  Rfl:  .  vitamin B-12 (CYANOCOBALAMIN) 1000 MCG tablet, Take 1,500 mcg by mouth daily. , Disp: , Rfl:  .  acetaminophen (TYLENOL) 650 MG CR tablet, Take 650 mg by mouth every 8 (eight) hours as needed for pain., Disp: , Rfl:  .  HYDROcodone-acetaminophen (NORCO) 5-325 MG tablet, Take 1 tablet by mouth every 6 (six) hours as needed for moderate pain. (Patient not taking: Reported on 09/10/2017), Disp: 20 tablet, Rfl: 0  Physical exam:  Vitals:   12/10/17 1402  BP: (!) 142/73  Pulse: 71  Resp: 18  Temp: (!) 97 F (36.1 C)  TempSrc: Tympanic  SpO2: 100%  Weight: 179 lb 3.2 oz (81.3 kg)  Height: '5\' 11"'  (1.803 m)   Physical Exam  Constitutional: He is oriented to person, place, and time. He appears well-developed and well-nourished.  HENT:  Head: Normocephalic and atraumatic.  Eyes: Pupils are equal, round, and reactive to light. EOM are normal.  Neck: Normal range of motion.  Cardiovascular: Normal rate, regular rhythm and normal heart sounds.  Pulmonary/Chest: Effort normal and breath sounds normal.  Abdominal: Soft. Bowel sounds are normal.  Neurological: He is alert and oriented to person, place, and time.  Skin: Skin is warm and dry.     CMP Latest Ref Rng & Units 12/10/2017  Glucose 70 - 99 mg/dL 125(H)  BUN 8 - 23 mg/dL 22  Creatinine 0.61 - 1.24 mg/dL 1.14  Sodium 135 - 145 mmol/L 141  Potassium 3.5 - 5.1 mmol/L 5.0  Chloride 98 - 111 mmol/L 108  CO2 22 - 32 mmol/L 25  Calcium 8.9 - 10.3 mg/dL 9.5  Total Protein 6.5 - 8.1 g/dL 6.9  Total Bilirubin 0.3 - 1.2 mg/dL 0.7  Alkaline Phos 38 - 126 U/L 57  AST 15 - 41 U/L 38  ALT 0 - 44 U/L 29   CBC Latest Ref Rng & Units 12/10/2017  WBC 3.8 - 10.6 K/uL 6.3  Hemoglobin 13.0 - 18.0 g/dL 9.6(L)  Hematocrit 40.0 - 52.0 % 29.1(L)  Platelets 150 - 440 K/uL 199    No images are attached to the encounter.  Ct Chest W Contrast  Result Date: 12/09/2017 CLINICAL DATA:  Restaging esophageal cancer. Chemotherapy and radiation  therapy. EXAM: CT CHEST, ABDOMEN, AND PELVIS WITH CONTRAST TECHNIQUE: Multidetector CT imaging of the chest, abdomen and pelvis was performed following the standard protocol during bolus administration of intravenous contrast. CONTRAST:  176m ISOVUE-300  IOPAMIDOL (ISOVUE-300) INJECTION 61% COMPARISON:  PET 09/04/2017. FINDINGS: CT CHEST FINDINGS Cardiovascular: Right IJ Port-A-Cath terminates in the low SVC. Atherosclerotic calcification of the arterial vasculature, including coronary arteries. Heart size normal. No pericardial effusion. Mediastinum/Nodes: No pathologically enlarged mediastinal, hilar, periesophageal or axillary lymph nodes. Esophagus is grossly unremarkable. Lungs/Pleura: Mild centrilobular and paraseptal emphysema. Basilar subpleural reticulation and ground-glass. Lungs are otherwise clear. No pleural fluid. Airway is unremarkable. Musculoskeletal: Degenerative changes in the spine. No worrisome lytic or sclerotic lesions. CT ABDOMEN PELVIS FINDINGS Hepatobiliary: Liver is unremarkable. Biliary ductal dilatation after cholecystectomy is unchanged. Pancreas: Negative. Spleen: Negative. Adrenals/Urinary Tract: Adrenal glands and kidneys are unremarkable. There may be left renal sinus cyst. Ureters are decompressed. Right-sided bladder diverticulum. Bladder is otherwise grossly unremarkable. Stomach/Bowel: Small hiatal hernia. There might be slight wall thickening involving the proximal stomach. Stomach, small bowel, appendix and colon are otherwise unremarkable. Vascular/Lymphatic: Atherosclerotic calcification of the arterial vasculature without abdominal aortic aneurysm. Left periaortic lymph nodes measure up to 11 mm (series 2, image 73), previously 1.5 cm on 09/04/2017. Gastrohepatic ligament lymph nodes measure up to 9 mm, similar. Reproductive: Prostate is normal in size. Other: No free fluid.  Mesenteries and peritoneum are unremarkable. Musculoskeletal: Degenerative changes in the spine. No  worrisome lytic or sclerotic lesions. IMPRESSION: 1. Slight decrease in size of left periaortic retroperitoneal lymph nodes. 2. Difficult to exclude proximal gastric wall thickening. 3. Aortic atherosclerosis (ICD10-170.0). Coronary artery calcification. 4.  Emphysema (ICD10-J43.9). Electronically Signed   By: Lorin Picket M.D.   On: 12/09/2017 09:58   Ct Abdomen Pelvis W Contrast  Result Date: 12/09/2017 CLINICAL DATA:  Restaging esophageal cancer. Chemotherapy and radiation therapy. EXAM: CT CHEST, ABDOMEN, AND PELVIS WITH CONTRAST TECHNIQUE: Multidetector CT imaging of the chest, abdomen and pelvis was performed following the standard protocol during bolus administration of intravenous contrast. CONTRAST:  15m ISOVUE-300 IOPAMIDOL (ISOVUE-300) INJECTION 61% COMPARISON:  PET 09/04/2017. FINDINGS: CT CHEST FINDINGS Cardiovascular: Right IJ Port-A-Cath terminates in the low SVC. Atherosclerotic calcification of the arterial vasculature, including coronary arteries. Heart size normal. No pericardial effusion. Mediastinum/Nodes: No pathologically enlarged mediastinal, hilar, periesophageal or axillary lymph nodes. Esophagus is grossly unremarkable. Lungs/Pleura: Mild centrilobular and paraseptal emphysema. Basilar subpleural reticulation and ground-glass. Lungs are otherwise clear. No pleural fluid. Airway is unremarkable. Musculoskeletal: Degenerative changes in the spine. No worrisome lytic or sclerotic lesions. CT ABDOMEN PELVIS FINDINGS Hepatobiliary: Liver is unremarkable. Biliary ductal dilatation after cholecystectomy is unchanged. Pancreas: Negative. Spleen: Negative. Adrenals/Urinary Tract: Adrenal glands and kidneys are unremarkable. There may be left renal sinus cyst. Ureters are decompressed. Right-sided bladder diverticulum. Bladder is otherwise grossly unremarkable. Stomach/Bowel: Small hiatal hernia. There might be slight wall thickening involving the proximal stomach. Stomach, small bowel,  appendix and colon are otherwise unremarkable. Vascular/Lymphatic: Atherosclerotic calcification of the arterial vasculature without abdominal aortic aneurysm. Left periaortic lymph nodes measure up to 11 mm (series 2, image 73), previously 1.5 cm on 09/04/2017. Gastrohepatic ligament lymph nodes measure up to 9 mm, similar. Reproductive: Prostate is normal in size. Other: No free fluid.  Mesenteries and peritoneum are unremarkable. Musculoskeletal: Degenerative changes in the spine. No worrisome lytic or sclerotic lesions. IMPRESSION: 1. Slight decrease in size of left periaortic retroperitoneal lymph nodes. 2. Difficult to exclude proximal gastric wall thickening. 3. Aortic atherosclerosis (ICD10-170.0). Coronary artery calcification. 4.  Emphysema (ICD10-J43.9). Electronically Signed   By: MLorin PicketM.D.   On: 12/09/2017 09:58     Assessment and plan- Patient is a 84y.o. male with stage  IV esophageal adenocarcinomacTX N0M1with metastases to the para-aortic lymph nodes/p concurrent chemo/RT with carbo/taxol.  Patient then received 2 cycles of palliative FOLFOX chemotherapy but his chemo has been on hold since June 2019 due to chemo induced anemia requiring blood transfusions and interruption in the treatment  I have personally reviewed CT chest abdomen and pelvis images independently area his esophagus appears normal.  Gastrohepatic adenopathy is stable and the para-aortic lymph node is actually decreased in size.  There is no other evidence of metastatic disease.  Also his anemia has improved after holding chemotherapy and giving him IV iron and is back up to 9.6.  His baseline hemoglobin is around 10.  If I restart FOLFOX at this time he is likely going to get anemic again and despite giving FOLFOX every 3 to 4 weeks we have not been able to improve his anemia significantly unless we hold his chemotherapy.  Given that his scans are looking better overall I would like to hold off on further  chemotherapy at this time given his age.    I will continue to get a repeat CT chest abdomen and pelvis in 3 months time.  He will get a CBC CMP and CEA just prior to the scan and I will see him after his scans  Plan at this time is to treat at progression.  I may consider re-challenging him with FOLFOX chemotherapy at that time.  If he is unable to tolerate chemotherapy down the line immunotherapy and Cyramza are options as well  I have explained all this to patient and his family and they are in agreement with the plan   Visit Diagnosis 1. Malignant neoplasm of esophagus, unspecified location ([place])   2. Antineoplastic chemotherapy induced anemia      Dr. Randa Evens, MD, MPH [facility] at [facility] [phone] 12/12/2017 8:27 AM

## 2017-12-26 ENCOUNTER — Encounter: Payer: Self-pay | Admitting: Radiation Oncology

## 2017-12-26 ENCOUNTER — Ambulatory Visit
Admission: RE | Admit: 2017-12-26 | Discharge: 2017-12-26 | Disposition: A | Discharge status: Home / Self Care | Payer: Medicare Other | Source: Ambulatory Visit | Attending: Radiation Oncology | Admitting: Radiation Oncology

## 2017-12-26 ENCOUNTER — Other Ambulatory Visit: Payer: Self-pay

## 2017-12-26 VITALS — BP 164/81 | HR 70 | Temp 95.7°F | Resp 18 | Wt 181.5 lb

## 2017-12-26 DIAGNOSIS — Z87891 Personal history of nicotine dependence: Secondary | ICD-10-CM | POA: Insufficient documentation

## 2017-12-26 DIAGNOSIS — Z923 Personal history of irradiation: Secondary | ICD-10-CM | POA: Diagnosis not present

## 2017-12-26 DIAGNOSIS — Z9221 Personal history of antineoplastic chemotherapy: Secondary | ICD-10-CM | POA: Diagnosis not present

## 2017-12-26 DIAGNOSIS — C155 Malignant neoplasm of lower third of esophagus: Secondary | ICD-10-CM

## 2017-12-26 NOTE — Progress Notes (Signed)
Radiation Oncology Follow up Note  Name: Eddie Castaneda   Date:   12/26/2017 MRN:  [id number] DOB: [birth year]    This 84 y.o. male presents to the clinic today for 5 month follow-up status post concurrent chemotherapy radiation therapy for probable stage IV adenocarcinoma the distal esophagus.  REFERRING PROVIDER: Adin Hector, MD  HPI: patient is a 84 year old male now out close to 6 months having completed concurrent chemoradiation therapy for probable stage IV adenocarcinoma the distal esophagus. He had hypermetabolic activity initially in that area aortic lymph nodes. He is seen today in routine follow-up and is doing exceptionally well he said he is having no dysphagia..his original pathology was adenocarcinoma with focal signet rings cell morphology.his most recent CT scan shows slight decrease in size of left periaortic retroperitoneal lymph nodes and has some's possible proximal gastric wall thickening which is expected. He has received 2 cycles of palliative FOLFOX chemotherapy.  COMPLICATIONS OF TREATMENT: none  FOLLOW UP COMPLIANCE: keeps appointments   PHYSICAL EXAM:  BP (!) 164/81 (BP Location: Left Arm, Patient Position: Sitting)   Pulse 70   Temp (!) 95.7 F (35.4 C) (Tympanic)   Resp 18   Wt 181 lb 8.8 oz (82.3 kg)   BMI 25.32 kg/m  Well-developed well-nourished patient in NAD. HEENT reveals PERLA, EOMI, discs not visualized.  Oral cavity is clear. No oral mucosal lesions are identified. Neck is clear without evidence of cervical or supraclavicular adenopathy. Lungs are clear to A&P. Cardiac examination is essentially unremarkable with regular rate and rhythm without murmur rub or thrill. Abdomen is benign with no organomegaly or masses noted. Motor sensory and DTR levels are equal and symmetric in the upper and lower extremities. Cranial nerves II through XII are grossly intact. Proprioception is intact. No peripheral adenopathy or edema is identified. No motor or  sensory levels are noted. Crude visual fields are within normal range.  RADIOLOGY RESULTS: CT scan reviewed and compatible with the above-stated findings  PLAN: present time patient is doing well off of all treatment. Plan is to possibly initiate immunotherapy if signs of progression. Otherwise I'm please was overall progress I've asked to see him back in 6 months for follow-up. Patient is to call with any concerns.  I would like to take this opportunity to thank you for allowing me to participate in the care of your patient.Noreene Filbert, MD

## 2018-01-14 ENCOUNTER — Inpatient Hospital Stay: Payer: Medicare Other | Attending: Oncology

## 2018-01-14 DIAGNOSIS — C155 Malignant neoplasm of lower third of esophagus: Secondary | ICD-10-CM | POA: Insufficient documentation

## 2018-01-14 DIAGNOSIS — C159 Malignant neoplasm of esophagus, unspecified: Secondary | ICD-10-CM

## 2018-01-14 DIAGNOSIS — Z452 Encounter for adjustment and management of vascular access device: Secondary | ICD-10-CM | POA: Diagnosis not present

## 2018-01-14 MED ORDER — SODIUM CHLORIDE 0.9% FLUSH
10.0000 mL | Freq: Once | INTRAVENOUS | Status: AC
  Administered 2018-01-14: 10 mL via INTRAVENOUS
  Filled 2018-01-14: qty 10

## 2018-01-14 MED ORDER — HEPARIN SOD (PORK) LOCK FLUSH 100 UNIT/ML IV SOLN
500.0000 [IU] | Freq: Once | INTRAVENOUS | Status: AC
  Administered 2018-01-14: 500 [IU] via INTRAVENOUS

## 2018-03-04 ENCOUNTER — Inpatient Hospital Stay: Payer: Medicare Other | Attending: Oncology

## 2018-03-04 ENCOUNTER — Ambulatory Visit
Admission: RE | Admit: 2018-03-04 | Discharge: 2018-03-04 | Disposition: A | Discharge status: Home / Self Care | Payer: Medicare Other | Source: Ambulatory Visit | Attending: Oncology | Admitting: Oncology

## 2018-03-04 DIAGNOSIS — J439 Emphysema, unspecified: Secondary | ICD-10-CM | POA: Insufficient documentation

## 2018-03-04 DIAGNOSIS — N323 Diverticulum of bladder: Secondary | ICD-10-CM | POA: Diagnosis not present

## 2018-03-04 DIAGNOSIS — C155 Malignant neoplasm of lower third of esophagus: Secondary | ICD-10-CM | POA: Diagnosis present

## 2018-03-04 DIAGNOSIS — R590 Localized enlarged lymph nodes: Secondary | ICD-10-CM | POA: Diagnosis not present

## 2018-03-04 DIAGNOSIS — I700 Atherosclerosis of aorta: Secondary | ICD-10-CM | POA: Insufficient documentation

## 2018-03-04 DIAGNOSIS — M48061 Spinal stenosis, lumbar region without neurogenic claudication: Secondary | ICD-10-CM | POA: Insufficient documentation

## 2018-03-04 DIAGNOSIS — Z452 Encounter for adjustment and management of vascular access device: Secondary | ICD-10-CM | POA: Diagnosis not present

## 2018-03-04 DIAGNOSIS — C159 Malignant neoplasm of esophagus, unspecified: Secondary | ICD-10-CM | POA: Insufficient documentation

## 2018-03-04 DIAGNOSIS — Z981 Arthrodesis status: Secondary | ICD-10-CM | POA: Diagnosis not present

## 2018-03-04 DIAGNOSIS — D6481 Anemia due to antineoplastic chemotherapy: Secondary | ICD-10-CM | POA: Insufficient documentation

## 2018-03-04 DIAGNOSIS — Z5112 Encounter for antineoplastic immunotherapy: Secondary | ICD-10-CM | POA: Insufficient documentation

## 2018-03-04 DIAGNOSIS — Z5111 Encounter for antineoplastic chemotherapy: Secondary | ICD-10-CM | POA: Diagnosis present

## 2018-03-04 LAB — CBC WITH DIFFERENTIAL/PLATELET
ABS IMMATURE GRANULOCYTES: 0.03 10*3/uL (ref 0.00–0.07)
Basophils Absolute: 0 10*3/uL (ref 0.0–0.1)
Basophils Relative: 1 %
EOS ABS: 0.3 10*3/uL (ref 0.0–0.5)
Eosinophils Relative: 6 %
HCT: 30.8 % — ABNORMAL LOW (ref 39.0–52.0)
Hemoglobin: 10 g/dL — ABNORMAL LOW (ref 13.0–17.0)
Immature Granulocytes: 1 %
Lymphocytes Relative: 16 %
Lymphs Abs: 0.9 10*3/uL (ref 0.7–4.0)
MCH: 32.8 pg (ref 26.0–34.0)
MCHC: 32.5 g/dL (ref 30.0–36.0)
MCV: 101 fL — AB (ref 80.0–100.0)
MONO ABS: 0.8 10*3/uL (ref 0.1–1.0)
Monocytes Relative: 15 %
NEUTROS ABS: 3.3 10*3/uL (ref 1.7–7.7)
NEUTROS PCT: 61 %
Platelets: 194 10*3/uL (ref 150–400)
RBC: 3.05 MIL/uL — AB (ref 4.22–5.81)
RDW: 13.5 % (ref 11.5–15.5)
WBC: 5.4 10*3/uL (ref 4.0–10.5)
nRBC: 0 % (ref 0.0–0.2)

## 2018-03-04 LAB — COMPREHENSIVE METABOLIC PANEL
ALK PHOS: 41 U/L (ref 38–126)
ALT: 14 U/L (ref 0–44)
ANION GAP: 6 (ref 5–15)
AST: 24 U/L (ref 15–41)
Albumin: 4.1 g/dL (ref 3.5–5.0)
BUN: 26 mg/dL — ABNORMAL HIGH (ref 8–23)
CALCIUM: 9.2 mg/dL (ref 8.9–10.3)
CO2: 27 mmol/L (ref 22–32)
CREATININE: 1.16 mg/dL (ref 0.61–1.24)
Chloride: 103 mmol/L (ref 98–111)
GFR, EST NON AFRICAN AMERICAN: 57 mL/min — AB (ref >60)
Glucose, Bld: 101 mg/dL — ABNORMAL HIGH (ref 70–99)
Potassium: 4.1 mmol/L (ref 3.5–5.1)
Sodium: 136 mmol/L (ref 135–145)
Total Bilirubin: 0.2 mg/dL — ABNORMAL LOW (ref 0.3–1.2)
Total Protein: 7.1 g/dL (ref 6.5–8.1)

## 2018-03-04 MED ORDER — IOPAMIDOL (ISOVUE-300) INJECTION 61%
100.0000 mL | Freq: Once | INTRAVENOUS | Status: AC | PRN
  Administered 2018-03-04: 100 mL via INTRAVENOUS

## 2018-03-04 MED ORDER — SODIUM CHLORIDE 0.9% FLUSH
10.0000 mL | Freq: Once | INTRAVENOUS | Status: AC
  Administered 2018-03-04: 10 mL via INTRAVENOUS
  Filled 2018-03-04: qty 10

## 2018-03-04 MED ORDER — HEPARIN SOD (PORK) LOCK FLUSH 100 UNIT/ML IV SOLN
500.0000 [IU] | Freq: Once | INTRAVENOUS | Status: AC
  Administered 2018-03-04: 500 [IU] via INTRAVENOUS
  Filled 2018-03-04: qty 5

## 2018-03-05 LAB — CEA: CEA: 11.2 ng/mL — ABNORMAL HIGH (ref 0.0–4.7)

## 2018-03-06 ENCOUNTER — Encounter: Payer: Self-pay | Admitting: Oncology

## 2018-03-06 ENCOUNTER — Inpatient Hospital Stay (HOSPITAL_BASED_OUTPATIENT_CLINIC_OR_DEPARTMENT_OTHER): Payer: Medicare Other | Admitting: Oncology

## 2018-03-06 VITALS — BP 169/90 | HR 63 | Temp 97.2°F | Resp 18 | Ht 71.0 in | Wt 182.5 lb

## 2018-03-06 DIAGNOSIS — Z5112 Encounter for antineoplastic immunotherapy: Secondary | ICD-10-CM | POA: Diagnosis not present

## 2018-03-06 DIAGNOSIS — C155 Malignant neoplasm of lower third of esophagus: Secondary | ICD-10-CM | POA: Diagnosis not present

## 2018-03-06 DIAGNOSIS — Z7189 Other specified counseling: Secondary | ICD-10-CM

## 2018-03-06 DIAGNOSIS — D539 Nutritional anemia, unspecified: Secondary | ICD-10-CM

## 2018-03-06 DIAGNOSIS — D6481 Anemia due to antineoplastic chemotherapy: Secondary | ICD-10-CM

## 2018-03-06 DIAGNOSIS — Z5111 Encounter for antineoplastic chemotherapy: Secondary | ICD-10-CM

## 2018-03-06 NOTE — Progress Notes (Signed)
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 Mass was nonobstructing, partially obstructing and not circumferential. 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 Angiolymphatic invasion was present. Fundic gland polyp was noted in the stomach which was negative for H. pylori dysplasia and malignancy.  Patient underwent PET/CT scan on 05/28/2017 which showed small hypermetabolic mass in the distal esophagus with an SUV of 10.7. Hypermetabolic left periaortic adenopathy compatible with malignancy. Larger of the 2 involved lymph nodes with a maximum SUV of 16.1. No other regions of tumor are identified.  Tumor marker testing shows he is positive for HER-2 and PDL 1 expressed  Biopsy of the left para-aortic lymph node positive for metastatic esophageal cancer  Patient completed concurrent chemo/RT on 07/24/17. He did miss his last dose of chemotherapy due to neutropenia  Repeat PET/Ct scan showed:IMPRESSION: 1. Today's study demonstrates a positive response to therapy with regression of the primary lesion in the distal esophagus and decreased hypermetabolism in retroperitoneal lymph nodes which are essentially unchanged in size. 2. There is a new focus of  hypermetabolism in the proximal stomach just distal to the gastroesophageal junction. This is of uncertain etiology and significance, and could be physiologic, but correlation with endoscopy may be considered.  MUGA scan showed normal EF   Chemotherapy has been on hold since 10/15/17 due to persistent anemia requiring blood transfusions. He only received 2 cycles thus far   Interval history-overall he is doing well and other than mild fatigue he denies any complaints.  His appetite is good and he denies any unintentional weight loss.  Denies any pain  ECOG PS- 1 Pain scale- 0 Opioid associated constipation- no  Review of systems- Review of Systems  Constitutional: Negative for chills, fever, malaise/fatigue and weight loss.  HENT: Negative for congestion, ear discharge and nosebleeds.   Eyes: Negative for blurred vision.  Respiratory: Negative for cough, hemoptysis, sputum production, shortness of breath and wheezing.   Cardiovascular: Negative for chest pain, palpitations, orthopnea and claudication.  Gastrointestinal: Negative for abdominal pain, blood in stool, constipation, diarrhea, heartburn, melena, nausea and vomiting.  Genitourinary: Negative for dysuria, flank pain, frequency, hematuria and urgency.  Musculoskeletal: Negative for back pain, joint pain and myalgias.  Skin: Negative for rash.  Neurological: Negative for dizziness, tingling, focal weakness, seizures, weakness and headaches.  Endo/Heme/Allergies: Does not bruise/bleed easily.  Psychiatric/Behavioral: Negative for depression and suicidal ideas. The patient does not have insomnia.       Allergies  Allergen Reactions  . Morphine And Related Nausea Only     Past Medical History:  Diagnosis Date  . Anemia   . Arthritis   . Coronary artery disease   . Diverticulitis   . Dysrhythmia   . Esophageal cancer ([place]) 05/2017   Chemo + rad tx's.   Marland Kitchen GERD (gastroesophageal reflux disease)   . Heart murmur     .  History of hiatal hernia   . Hyperglycemia   . Hyperlipidemia   . Hypertension   . Hypothyroidism   . Spinal stenosis      Past Surgical History:  Procedure Laterality Date  . BACK SURGERY    . CATARACT EXTRACTION W/PHACO Left 11/08/2014   Procedure: CATARACT EXTRACTION PHACO AND INTRAOCULAR LENS PLACEMENT (IOC);  Surgeon: Estill Cotta, MD;  Location: [facility] ORS;  Service: Ophthalmology;  Laterality: Left;  US:01:20 AP:21.8 CDE:31.09 PAC LOT: [id number]  . CHOLECYSTECTOMY    . ESOPHAGOGASTRODUODENOSCOPY (EGD) WITH PROPOFOL N/A 05/23/2017   Procedure: ESOPHAGOGASTRODUODENOSCOPY (EGD) WITH PROPOFOL;  Surgeon: Lollie Sails, MD;  Location: [facility] [place] ENDOSCOPY;  Service: Endoscopy;  Laterality: N/A;  . EYE SURGERY    . IR FLUORO GUIDE PORT INSERTION RIGHT  06/07/2017  . JOINT REPLACEMENT Right    Knee  . ROTATOR CUFF REPAIR Right     Social History   Socioeconomic History  . Marital status: Married    Spouse name: Not on file  . Number of children: Not on file  . Years of education: Not on file  . Highest education level: Not on file  Occupational History  . Not on file  Social Needs  . Financial resource strain: Not on file  . Food insecurity:    Worry: Not on file    Inability: Not on file  . Transportation needs:    Medical: Not on file    Non-medical: Not on file  Tobacco Use  . Smoking status: Former Smoker    Last attempt to quit: 03/24/1989    Years since quitting: 28.9  . Smokeless tobacco: Never Used  Substance and Sexual Activity  . Alcohol use: No    Frequency: Never  . Drug use: No  . Sexual activity: Not on file  Lifestyle  . Physical activity:    Days per week: Not on file    Minutes per session: Not on file  . Stress: Not on file  Relationships  . Social connections:    Talks on phone: Not on file    Gets together: Not on file    Attends religious service: Not on file    Active member of club or organization: Not on file    Attends  meetings of clubs or organizations: Not on file    Relationship status: Not on file  . Intimate partner violence:    Fear of current or ex partner: Not on file    Emotionally abused: Not on file    Physically abused: Not on file    Forced sexual activity: Not on file  Other Topics Concern  . Not on file  Social History Narrative  . Not on file    Family History  Problem Relation Age of Onset  . Colon cancer Mother      Current Outpatient Medications:  .  acetaminophen (TYLENOL) 650 MG CR tablet, Take 650 mg by mouth every 8 (eight) hours as needed for pain., Disp: , Rfl:  .  BACILLUS COAGULANS-INULIN PO, Take by mouth., Disp: , Rfl:  .  cholecalciferol (VITAMIN D) 1000 units tablet, Take 1,000 Units by mouth daily., Disp: , Rfl:  .  co-enzyme Q-10 30 MG capsule, Take 30 mg by mouth daily., Disp: , Rfl:  .  docusate sodium (COLACE) 100 MG capsule, Take 100 mg by mouth daily., Disp: , Rfl:  .  HYDROcodone-acetaminophen (NORCO) 5-325 MG tablet, Take 1 tablet by mouth every 6 (six) hours as needed for moderate pain. (  Patient not taking: Reported on 09/10/2017), Disp: 20 tablet, Rfl: 0 .  levothyroxine (SYNTHROID, LEVOTHROID) 25 MCG tablet, Take 25 mcg by mouth daily before breakfast., Disp: , Rfl:  .  metoprolol tartrate (LOPRESSOR) 25 MG tablet, Take 25 mg by mouth daily., Disp: , Rfl:  .  Multiple Vitamin (MULTIVITAMIN) tablet, Take 1 tablet by mouth daily., Disp: , Rfl:  .  Omega-3 1000 MG CAPS, Take 340-1,000 capsules by mouth 2 (two) times daily., Disp: , Rfl:  .  omeprazole (PRILOSEC) 20 MG capsule, Take 20 mg by mouth daily., Disp: , Rfl:  .  pravastatin (PRAVACHOL) 20 MG tablet, Take 20 mg by mouth daily., Disp: , Rfl:  .  predniSONE (DELTASONE) 5 MG tablet, Take 5 mg by mouth daily with breakfast., Disp: , Rfl:  .  Probiotic Product (PROBIOTIC COMPLEX ACIDOPHILUS PO), Take by mouth., Disp: , Rfl:  .  quinapril (ACCUPRIL) 40 MG tablet, Take 40 mg by mouth 1 day or 1 dose., Disp:  , Rfl:  .  tamsulosin (FLOMAX) 0.4 MG CAPS capsule, Take 0.4 mg by mouth daily., Disp: , Rfl:  .  traMADol (ULTRAM-ER) 100 MG 24 hr tablet, Take 50 mg by mouth 2 (two) times daily. With lunch and dinner , Disp: , Rfl:  .  traMADol (ULTRAM-ER) 300 MG 24 hr tablet, Take 300 mg by mouth daily. AM dose, Disp: , Rfl:  .  vitamin B-12 (CYANOCOBALAMIN) 1000 MCG tablet, Take 1,500 mcg by mouth daily. , Disp: , Rfl:   Physical exam:  Vitals:   03/06/18 1351  BP: (!) 169/90  Pulse: 63  Resp: 18  Temp: (!) 97.2 F (36.2 C)  TempSrc: Tympanic  SpO2: 100%  Weight: 182 lb 8 oz (82.8 kg)  Height: _0  (1.803 m)   Physical Exam  Constitutional: He is oriented to person, place, and time. He appears well-developed and well-nourished.  HENT:  Head: Normocephalic and atraumatic.  Eyes: Pupils are equal, round, and reactive to light. EOM are normal.  Neck: Normal range of motion.  Cardiovascular: Normal rate, regular rhythm and normal heart sounds.  Pulmonary/Chest: Effort normal and breath sounds normal.  Abdominal: Soft. Bowel sounds are normal.  Neurological: He is alert and oriented to person, place, and time.  Skin: Skin is warm and dry.     CMP Latest Ref Rng & Units 03/04/2018  Glucose 70 - 99 mg/dL 101(H)  BUN 8 - 23 mg/dL 26(H)  Creatinine 0.61 - 1.24 mg/dL 1.16  Sodium 135 - 145 mmol/L 136  Potassium 3.5 - 5.1 mmol/L 4.1  Chloride 98 - 111 mmol/L 103  CO2 22 - 32 mmol/L 27  Calcium 8.9 - 10.3 mg/dL 9.2  Total Protein 6.5 - 8.1 g/dL 7.1  Total Bilirubin 0.3 - 1.2 mg/dL 0.2(L)  Alkaline Phos 38 - 126 U/L 41  AST 15 - 41 U/L 24  ALT 0 - 44 U/L 14   CBC Latest Ref Rng & Units 03/04/2018  WBC 4.0 - 10.5 K/uL 5.4  Hemoglobin 13.0 - 17.0 g/dL 10.0(L)  Hematocrit 39.0 - 52.0 % 30.8(L)  Platelets 150 - 400 K/uL 194    No images are attached to the encounter.  Ct Chest W Contrast  Result Date: 03/04/2018 CLINICAL DATA:  Stage IV esophageal cancer initially diagnosed in  February 2019. Post chemotherapy and radiation therapy. No acute complaints. EXAM: CT CHEST, ABDOMEN, AND PELVIS WITH CONTRAST TECHNIQUE: Multidetector CT imaging of the chest, abdomen and pelvis was performed following the standard protocol  during bolus administration of intravenous contrast. CONTRAST:  138m ISOVUE-300 IOPAMIDOL (ISOVUE-300) INJECTION 61% COMPARISON:  CT 12/09/2017.  PET-CT 09/04/2017 FINDINGS: CT CHEST FINDINGS Cardiovascular: Mild atherosclerosis of the aorta, great vessels and coronary arteries. Right IJ Port-A-Cath extends to superior cavoatrial junction. There are dense calcifications of the aortic valve. The heart size is normal. There is no pericardial effusion. Mediastinum/Nodes: There are no enlarged mediastinal, hilar or axillary lymph nodes. There are stable small mediastinal lymph nodes, including a 7 mm node in the high right tracheoesophageal groove (image 9/2). The thyroid gland and trachea appear normal. There is mild distal esophageal wall thickening without focal lesion. Lungs/Pleura: There is no pleural effusion or pneumothorax. There is mild centrilobular and paraseptal emphysema with scattered subpleural reticulation. No suspicious pulmonary nodule. Musculoskeletal/Chest wall: No chest wall mass or suspicious osseous findings. CT ABDOMEN AND PELVIS FINDINGS Hepatobiliary: The liver is normal in density without focal abnormality. No significant biliary dilatation post cholecystectomy. Pancreas: Unremarkable. No pancreatic ductal dilatation or surrounding inflammatory changes. Spleen: Normal in size without focal abnormality. Adrenals/Urinary Tract: Both adrenal glands appear normal. There is no evidence of urinary tract calculus or hydronephrosis. There is a small left renal parapelvic cysts. Both kidneys otherwise appear normal. The bladder demonstrates mild wall thickening and a right-sided Hutch diverticulum. Stomach/Bowel: There is stable mild wall thickening within the  distal esophagus and proximal stomach without focal mass lesion. There is no small or large bowel wall thickening, distention or surrounding inflammation. The appendix appears normal. There is moderate stool throughout the colon. There are mild diverticular changes of the distal colon. Vascular/Lymphatic: There is progressive adenopathy within the gastrohepatic ligament and retroperitoneum. A cluster of nodes in the gastrohepatic ligament measures 21 x 17 mm on image 58/2. There are multiple enlarging left periaortic nodes, including an 11 mm node on image 66/2, a 15 mm node on image 67/2, a 15 mm node on image 72/2 and a 13 mm node on image 73/2. There is no pelvic lymphadenopathy. There is moderate aortic and branch vessel atherosclerosis without acute vascular findings. The portal vein is patent. Reproductive: The prostate gland and seminal vesicles appear normal. Other: Intact anterior abdominal wall. No ascites or peritoneal nodularity. Musculoskeletal: No acute osseous findings or evidence of metastatic disease. Status post L3-5 interbody fusion. There is adjacent segment disease at L2-3 with posterior osteophytes and facet hypertrophy contributing to spinal stenosis. 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 IMPRESSION: 1. Significant progression of lymphadenopathy in the gastrohepatic ligament and retroperitoneum consistent with progressive metastatic esophageal cancer. No thoracic adenopathy identified. 2. Similar mild wall thickening of the distal esophagus and proximal stomach without obvious focal mucosal lesion. 3. No evidence of pulmonary, hepatic or osseous metastatic disease. 4. Aortic Atherosclerosis (ICD10-I70.0) and Emphysema (ICD10-J43.9). 5. Right-sided bladder diverticulum. 6. L2-3 adjacent segment disease post L3-5 fusion. 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 Electronically  Signed   By: WRichardean SaleM.D.   On: 03/04/2018 17:20   Ct Abdomen Pelvis W Contrast  Result Date: 03/04/2018 CLINICAL DATA:  Stage IV esophageal cancer initially diagnosed in February 2019. Post chemotherapy and radiation therapy. No acute complaints. EXAM: CT CHEST, ABDOMEN, AND PELVIS WITH CONTRAST TECHNIQUE: Multidetector CT imaging of the chest, abdomen and pelvis was performed following the standard protocol during bolus administration of intravenous contrast. CONTRAST:  1078mISOVUE-300 IOPAMIDOL (ISOVUE-300) INJECTION 61% COMPARISON:  CT 12/09/2017.  PET-CT 09/04/2017  FINDINGS: CT CHEST FINDINGS Cardiovascular: Mild atherosclerosis of the aorta, great vessels and coronary arteries. Right IJ Port-A-Cath extends to superior cavoatrial junction. There are dense calcifications of the aortic valve. The heart size is normal. There is no pericardial effusion. Mediastinum/Nodes: There are no enlarged mediastinal, hilar or axillary lymph nodes. There are stable small mediastinal lymph nodes, including a 7 mm node in the high right tracheoesophageal groove (image 9/2). The thyroid gland and trachea appear normal. There is mild distal esophageal wall thickening without focal lesion. Lungs/Pleura: There is no pleural effusion or pneumothorax. There is mild centrilobular and paraseptal emphysema with scattered subpleural reticulation. No suspicious pulmonary nodule. Musculoskeletal/Chest wall: No chest wall mass or suspicious osseous findings. CT ABDOMEN AND PELVIS FINDINGS Hepatobiliary: The liver is normal in density without focal abnormality. No significant biliary dilatation post cholecystectomy. Pancreas: Unremarkable. No pancreatic ductal dilatation or surrounding inflammatory changes. Spleen: Normal in size without focal abnormality. Adrenals/Urinary Tract: Both adrenal glands appear normal. There is no evidence of urinary tract calculus or hydronephrosis. There is a small left renal parapelvic cysts. Both  kidneys otherwise appear normal. The bladder demonstrates mild wall thickening and a right-sided Hutch diverticulum. Stomach/Bowel: There is stable mild wall thickening within the distal esophagus and proximal stomach without focal mass lesion. There is no small or large bowel wall thickening, distention or surrounding inflammation. The appendix appears normal. There is moderate stool throughout the colon. There are mild diverticular changes of the distal colon. Vascular/Lymphatic: There is progressive adenopathy within the gastrohepatic ligament and retroperitoneum. A cluster of nodes in the gastrohepatic ligament measures 21 x 17 mm on image 58/2. There are multiple enlarging left periaortic nodes, including an 11 mm node on image 66/2, a 15 mm node on image 67/2, a 15 mm node on image 72/2 and a 13 mm node on image 73/2. There is no pelvic lymphadenopathy. There is moderate aortic and branch vessel atherosclerosis without acute vascular findings. The portal vein is patent. Reproductive: The prostate gland and seminal vesicles appear normal. Other: Intact anterior abdominal wall. No ascites or peritoneal nodularity. Musculoskeletal: No acute osseous findings or evidence of metastatic disease. Status post L3-5 interbody fusion. There is adjacent segment disease at L2-3 with posterior osteophytes and facet hypertrophy contributing to spinal stenosis. 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 IMPRESSION: 1. Significant progression of lymphadenopathy in the gastrohepatic ligament and retroperitoneum consistent with progressive metastatic esophageal cancer. No thoracic adenopathy identified. 2. Similar mild wall thickening of the distal esophagus and proximal stomach without obvious focal mucosal lesion. 3. No evidence of pulmonary, hepatic or osseous metastatic disease. 4. Aortic Atherosclerosis (ICD10-I70.0) and Emphysema  (ICD10-J43.9). 5. Right-sided bladder diverticulum. 6. L2-3 adjacent segment disease post L3-5 fusion. 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 Electronically Signed   By: Richardean Sale M.D.   On: 03/04/2018 17:20     Assessment and plan- Patient is a 84 y.o. male with stage IV esophageal adenocarcinomacTX N0M1with metastases to the para-aortic lymph nodes/p concurrent chemo/RT with carbo/taxol.  Patient then received 2 cycles of palliative FOLFOX chemotherapy but his chemo has been on hold since June 2019 due to chemo induced anemia requiring blood transfusions and interruption in the treatment.  He is here to discuss CT scan results and further management  I have reviewed CT chest abdomen and pelvis images independently and discussed findings with the patient.  Patient had a chemo holiday  for 3 months as he could not tolerate FOLFOX due to significant anemia.  CT scans unfortunately show progression of adenopathy in his retroperitoneum as well as progressive adenopathy in the gastrohepatic region.  No evidence of metastatic disease elsewhere.  Also his CEA has increased to 11.2 and was 1.1 2 months ago.  Discussed that restarting chemotherapy versus immunotherapy versus Cyramza are all acceptable options but chemotherapy would likely offer the most benefit at this time.  Given that he had significant anemia with FOLFOX I will hold off on giving him 5-FU bolus as well as oxaliplatin at this time and only give him infusional 5-FU.  He will also be getting Herceptin along with that.  I anticipate that he would need chemotherapy every 3 weeks instead of every 2 weeks and due to his counts to be able to give it to him in a more consistent fashion.  He has previously tolerated FOLFOX well other than anemia.  Patient will directly proceed for his infusional 5-FU and Herceptin on 03/11/2018.  I will see him back 3 weeks from now on 03/25/2018 with CBC CMP and a CEA for possible  transfusion.  Patient has baseline normocytic/macrocytic anemia with a hemoglobin of 10 lifelong even prior to his cancer diagnosis and there may be an element of MDS to this.    Visit Diagnosis 1. Goals of care, counseling/discussion   2. Malignant neoplasm of lower third of esophagus (HCC)   3. Encounter for antineoplastic chemotherapy   4. Macrocytic anemia      Dr. Randa Evens, MD, MPH [facility] [phone] 03/06/2018 1:46 PM

## 2018-03-06 NOTE — Progress Notes (Signed)
No new changes noted today 

## 2018-03-12 ENCOUNTER — Inpatient Hospital Stay: Payer: Medicare Other

## 2018-03-12 VITALS — BP 144/77 | HR 64 | Temp 97.7°F | Resp 18 | Ht 71.0 in | Wt 182.0 lb

## 2018-03-12 DIAGNOSIS — Z5112 Encounter for antineoplastic immunotherapy: Secondary | ICD-10-CM | POA: Diagnosis not present

## 2018-03-12 DIAGNOSIS — C159 Malignant neoplasm of esophagus, unspecified: Secondary | ICD-10-CM

## 2018-03-12 DIAGNOSIS — C155 Malignant neoplasm of lower third of esophagus: Secondary | ICD-10-CM

## 2018-03-12 MED ORDER — TRASTUZUMAB CHEMO 150 MG IV SOLR
8.0000 mg/kg | Freq: Once | INTRAVENOUS | Status: AC
  Administered 2018-03-12: 630 mg via INTRAVENOUS
  Filled 2018-03-12: qty 30

## 2018-03-12 MED ORDER — DEXTROSE 5 % IV SOLN
Freq: Once | INTRAVENOUS | Status: DC
  Filled 2018-03-12: qty 250

## 2018-03-12 MED ORDER — PALONOSETRON HCL INJECTION 0.25 MG/5ML
0.2500 mg | Freq: Once | INTRAVENOUS | Status: AC
  Administered 2018-03-12: 0.25 mg via INTRAVENOUS
  Filled 2018-03-12: qty 5

## 2018-03-12 MED ORDER — LEUCOVORIN CALCIUM INJECTION 350 MG: 400.0000 mg/m2 | Freq: Once | INTRAVENOUS | Status: DC

## 2018-03-12 MED ORDER — DEXAMETHASONE SODIUM PHOSPHATE 10 MG/ML IJ SOLN
10.0000 mg | Freq: Once | INTRAMUSCULAR | Status: AC
  Administered 2018-03-12: 10 mg via INTRAVENOUS
  Filled 2018-03-12: qty 1

## 2018-03-12 MED ORDER — LEUCOVORIN CALCIUM INJECTION 100 MG
20.0000 mg/m2 | Freq: Once | INTRAMUSCULAR | Status: AC
  Administered 2018-03-12: 40 mg via INTRAVENOUS
  Filled 2018-03-12: qty 2

## 2018-03-12 MED ORDER — DIPHENHYDRAMINE HCL 25 MG PO CAPS
50.0000 mg | ORAL_CAPSULE | Freq: Once | ORAL | Status: AC
  Administered 2018-03-12: 50 mg via ORAL
  Filled 2018-03-12: qty 2

## 2018-03-12 MED ORDER — SODIUM CHLORIDE 0.9 % IV SOLN
2400.0000 mg/m2 | INTRAVENOUS | Status: AC
  Administered 2018-03-12: 4800 mg via INTRAVENOUS
  Filled 2018-03-12: qty 96

## 2018-03-12 MED ORDER — ACETAMINOPHEN 325 MG PO TABS
650.0000 mg | ORAL_TABLET | Freq: Once | ORAL | Status: AC
  Administered 2018-03-12: 650 mg via ORAL
  Filled 2018-03-12: qty 2

## 2018-03-12 MED ORDER — SODIUM CHLORIDE 0.9 % IV SOLN
Freq: Once | INTRAVENOUS | Status: AC
  Administered 2018-03-12: 13:00:00 via INTRAVENOUS
  Filled 2018-03-12: qty 250

## 2018-03-12 NOTE — Progress Notes (Signed)
Labs from 11/12 ok to use for todays treatment per Dr. Grayland Ormond.

## 2018-03-14 ENCOUNTER — Inpatient Hospital Stay: Payer: Medicare Other

## 2018-03-14 VITALS — BP 162/78 | HR 66 | Resp 18

## 2018-03-14 DIAGNOSIS — C159 Malignant neoplasm of esophagus, unspecified: Secondary | ICD-10-CM

## 2018-03-14 DIAGNOSIS — Z5112 Encounter for antineoplastic immunotherapy: Secondary | ICD-10-CM | POA: Diagnosis not present

## 2018-03-14 MED ORDER — HEPARIN SOD (PORK) LOCK FLUSH 100 UNIT/ML IV SOLN
500.0000 [IU] | Freq: Once | INTRAVENOUS | Status: AC | PRN
  Administered 2018-03-14: 500 [IU]

## 2018-03-23 ENCOUNTER — Other Ambulatory Visit: Payer: Self-pay | Admitting: *Deleted

## 2018-03-23 DIAGNOSIS — C155 Malignant neoplasm of lower third of esophagus: Secondary | ICD-10-CM

## 2018-03-23 DIAGNOSIS — D6481 Anemia due to antineoplastic chemotherapy: Secondary | ICD-10-CM

## 2018-03-23 DIAGNOSIS — T451X5A Adverse effect of antineoplastic and immunosuppressive drugs, initial encounter: Secondary | ICD-10-CM

## 2018-03-25 ENCOUNTER — Inpatient Hospital Stay (HOSPITAL_BASED_OUTPATIENT_CLINIC_OR_DEPARTMENT_OTHER): Payer: Medicare Other | Admitting: Oncology

## 2018-03-25 ENCOUNTER — Inpatient Hospital Stay: Payer: Medicare Other

## 2018-03-25 ENCOUNTER — Ambulatory Visit: Payer: Medicare Other | Admitting: Oncology

## 2018-03-25 ENCOUNTER — Encounter: Payer: Self-pay | Admitting: Oncology

## 2018-03-25 ENCOUNTER — Inpatient Hospital Stay: Payer: Medicare Other | Attending: Oncology

## 2018-03-25 VITALS — BP 159/86 | HR 58 | Temp 97.9°F | Resp 16 | Ht 71.0 in | Wt 182.1 lb

## 2018-03-25 DIAGNOSIS — T451X5A Adverse effect of antineoplastic and immunosuppressive drugs, initial encounter: Secondary | ICD-10-CM

## 2018-03-25 DIAGNOSIS — D6481 Anemia due to antineoplastic chemotherapy: Secondary | ICD-10-CM | POA: Insufficient documentation

## 2018-03-25 DIAGNOSIS — Z5189 Encounter for other specified aftercare: Secondary | ICD-10-CM | POA: Insufficient documentation

## 2018-03-25 DIAGNOSIS — Z5111 Encounter for antineoplastic chemotherapy: Secondary | ICD-10-CM | POA: Diagnosis present

## 2018-03-25 DIAGNOSIS — C155 Malignant neoplasm of lower third of esophagus: Secondary | ICD-10-CM | POA: Insufficient documentation

## 2018-03-25 DIAGNOSIS — D701 Agranulocytosis secondary to cancer chemotherapy: Secondary | ICD-10-CM | POA: Diagnosis not present

## 2018-03-25 DIAGNOSIS — Z5112 Encounter for antineoplastic immunotherapy: Secondary | ICD-10-CM | POA: Diagnosis present

## 2018-03-25 LAB — COMPREHENSIVE METABOLIC PANEL
ALT: 15 U/L (ref 0–44)
AST: 25 U/L (ref 15–41)
Albumin: 3.9 g/dL (ref 3.5–5.0)
Alkaline Phosphatase: 36 U/L — ABNORMAL LOW (ref 38–126)
Anion gap: 8 (ref 5–15)
BILIRUBIN TOTAL: 0.5 mg/dL (ref 0.3–1.2)
BUN: 32 mg/dL — ABNORMAL HIGH (ref 8–23)
CO2: 25 mmol/L (ref 22–32)
Calcium: 9.1 mg/dL (ref 8.9–10.3)
Chloride: 107 mmol/L (ref 98–111)
Creatinine, Ser: 1.15 mg/dL (ref 0.61–1.24)
GFR calc Af Amer: >60 mL/min (ref >60)
GFR calc non Af Amer: 59 mL/min — ABNORMAL LOW (ref >60)
Glucose, Bld: 142 mg/dL — ABNORMAL HIGH (ref 70–99)
POTASSIUM: 3.9 mmol/L (ref 3.5–5.1)
Sodium: 140 mmol/L (ref 135–145)
Total Protein: 6.7 g/dL (ref 6.5–8.1)

## 2018-03-25 LAB — CBC WITH DIFFERENTIAL/PLATELET
Abs Immature Granulocytes: 0.01 10*3/uL (ref 0.00–0.07)
Basophils Absolute: 0 10*3/uL (ref 0.0–0.1)
Basophils Relative: 1 %
Eosinophils Absolute: 0.2 10*3/uL (ref 0.0–0.5)
Eosinophils Relative: 6 %
HCT: 26.8 % — ABNORMAL LOW (ref 39.0–52.0)
Hemoglobin: 8.6 g/dL — ABNORMAL LOW (ref 13.0–17.0)
Immature Granulocytes: 0 %
Lymphocytes Relative: 24 %
Lymphs Abs: 0.8 10*3/uL (ref 0.7–4.0)
MCH: 32.1 pg (ref 26.0–34.0)
MCHC: 32.1 g/dL (ref 30.0–36.0)
MCV: 100 fL (ref 80.0–100.0)
MONOS PCT: 13 %
Monocytes Absolute: 0.4 10*3/uL (ref 0.1–1.0)
Neutro Abs: 1.8 10*3/uL (ref 1.7–7.7)
Neutrophils Relative %: 56 %
Platelets: 138 10*3/uL — ABNORMAL LOW (ref 150–400)
RBC: 2.68 MIL/uL — ABNORMAL LOW (ref 4.22–5.81)
RDW: 13.2 % (ref 11.5–15.5)
WBC: 3.2 10*3/uL — ABNORMAL LOW (ref 4.0–10.5)
nRBC: 0 % (ref 0.0–0.2)

## 2018-03-25 LAB — SAMPLE TO BLOOD BANK

## 2018-03-25 MED ORDER — HEPARIN SOD (PORK) LOCK FLUSH 100 UNIT/ML IV SOLN
500.0000 [IU] | Freq: Once | INTRAVENOUS | Status: AC
  Administered 2018-03-25: 500 [IU] via INTRAVENOUS

## 2018-03-25 NOTE — Addendum Note (Signed)
Addended by: Waymon Budge on: 03/25/2018 12:57 PM   Modules accepted: Orders

## 2018-03-25 NOTE — Progress Notes (Signed)
No infusion needed today per Judeen Hammans RN per Dr. Janese Banks.

## 2018-03-25 NOTE — Progress Notes (Signed)
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 Mass was nonobstructing, partially obstructing and not circumferential. 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 Angiolymphatic invasion was present. Fundic gland polyp was noted in the stomach which was negative for H. pylori dysplasia and malignancy.  Patient underwent PET/CT scan on 05/28/2017 which showed small hypermetabolic mass in the distal esophagus with an SUV of 10.7. Hypermetabolic left periaortic adenopathy compatible with malignancy. Larger of the 2 involved lymph nodes with a maximum SUV of 16.1. No other regions of tumor are identified.  Tumor marker testing shows he is positive for HER-2 and PDL 1 expressed  Biopsy of the left para-aortic lymph node positive for metastatic esophageal cancer  Patient completed concurrent chemo/RT on 07/24/17. He did miss his last dose of chemotherapy due to neutropenia  Repeat PET/Ct scan showed:IMPRESSION: 1. Today's study demonstrates a positive response to therapy with regression of the primary lesion in the distal esophagus and decreased hypermetabolism in retroperitoneal lymph nodes which are essentially unchanged in size. 2. There is  a new focus of hypermetabolism in the proximal stomach just distal to the gastroesophageal junction. This is of uncertain etiology and significance, and could be physiologic, but correlation with endoscopy may be considered.  MUGA scan showed normal EF  Chemotherapy has been on hold since 10/15/17 due to persistent anemia requiring blood transfusions. He only received 2 cycles.  Scans in November 2019 showed progression of adenopathy in the retroperitoneal and gastrohepatic region.  Infusional 5-FU and Herceptin was restarted omitting bolus 5-FU and oxaliplatin  Interval history- reports mild fatigue. Denies other complaints  ECOG PS- 1 Pain scale- 0 Opioid associated constipation- no  Review of systems- Review of Systems  Constitutional: Positive for malaise/fatigue. Negative for chills, fever and weight loss.  HENT: Negative for congestion, ear discharge and nosebleeds.   Eyes: Negative for blurred vision.  Respiratory: Negative for cough, hemoptysis, sputum production, shortness of breath and wheezing.   Cardiovascular: Negative for chest pain, palpitations, orthopnea and claudication.  Gastrointestinal: Negative for abdominal pain, blood in stool, constipation, diarrhea, heartburn, melena, nausea and vomiting.  Genitourinary: Negative for dysuria, flank pain, frequency, hematuria and urgency.  Musculoskeletal: Negative for back pain, joint pain and myalgias.  Skin: Negative for rash.  Neurological: Negative for dizziness, tingling, focal weakness, seizures, weakness and headaches.  Endo/Heme/Allergies: Does not bruise/bleed easily.  Psychiatric/Behavioral: Negative for depression and suicidal ideas. The patient does not have insomnia.        Allergies  Allergen Reactions  . Morphine And Related Nausea Only     Past Medical History:  Diagnosis Date  . Anemia   . Arthritis   . Coronary artery disease   . Diverticulitis   . Dysrhythmia   . Esophageal cancer ([place])  05/2017   Chemo + rad tx's.   Marland Kitchen GERD (gastroesophageal reflux disease)   .  Heart murmur   . History of hiatal hernia   . Hyperglycemia   . Hyperlipidemia   . Hypertension   . Hypothyroidism   . Spinal stenosis      Past Surgical History:  Procedure Laterality Date  . BACK SURGERY    . CATARACT EXTRACTION W/PHACO Left 11/08/2014   Procedure: CATARACT EXTRACTION PHACO AND INTRAOCULAR LENS PLACEMENT (IOC);  Surgeon: Estill Cotta, MD;  Location: [facility] ORS;  Service: Ophthalmology;  Laterality: Left;  US:01:20 AP:21.8 CDE:31.09 PAC LOT: [id number]  . CHOLECYSTECTOMY    . ESOPHAGOGASTRODUODENOSCOPY (EGD) WITH PROPOFOL N/A 05/23/2017   Procedure: ESOPHAGOGASTRODUODENOSCOPY (EGD) WITH PROPOFOL;  Surgeon: Lollie Sails, MD;  Location: [facility] ENDOSCOPY;  Service: Endoscopy;  Laterality: N/A;  . EYE SURGERY    . IR FLUORO GUIDE PORT INSERTION RIGHT  06/07/2017  . JOINT REPLACEMENT Right    Knee  . ROTATOR CUFF REPAIR Right     Social History   Socioeconomic History  . Marital status: Married    Spouse name: Not on file  . Number of children: Not on file  . Years of education: Not on file  . Highest education level: Not on file  Occupational History  . Not on file  Social Needs  . Financial resource strain: Not on file  . Food insecurity:    Worry: Not on file    Inability: Not on file  . Transportation needs:    Medical: Not on file    Non-medical: Not on file  Tobacco Use  . Smoking status: Former Smoker    Last attempt to quit: 03/24/1989    Years since quitting: 29.0  . Smokeless tobacco: Never Used  Substance and Sexual Activity  . Alcohol use: No    Frequency: Never  . Drug use: No  . Sexual activity: Not on file  Lifestyle  . Physical activity:    Days per week: Not on file    Minutes per session: Not on file  . Stress: Not on file  Relationships  . Social connections:    Talks on phone: Not on file    Gets together: Not on file    Attends religious  service: Not on file    Active member of club or organization: Not on file    Attends meetings of clubs or organizations: Not on file    Relationship status: Not on file  . Intimate partner violence:    Fear of current or ex partner: Not on file    Emotionally abused: Not on file    Physically abused: Not on file    Forced sexual activity: Not on file  Other Topics Concern  . Not on file  Social History Narrative  . Not on file    Family History  Problem Relation Age of Onset  . Colon cancer Mother      Current Outpatient Medications:  .  acetaminophen (TYLENOL) 650 MG CR tablet, Take 650 mg by mouth every 8 (eight) hours as needed for pain., Disp: , Rfl:  .  BACILLUS COAGULANS-INULIN PO, Take by mouth., Disp: , Rfl:  .  cholecalciferol (VITAMIN D) 1000 units tablet, Take 1,000 Units by mouth daily., Disp: , Rfl:  .  co-enzyme Q-10 30 MG capsule, Take 30 mg by mouth daily., Disp: , Rfl:  .  docusate sodium (COLACE) 100 MG capsule, Take 100 mg by mouth daily., Disp: , Rfl:  .  HYDROcodone-acetaminophen (NORCO) 5-325 MG tablet, Take 1 tablet by mouth every 6 (six) hours  as needed for moderate pain. (Patient not taking: Reported on 09/10/2017), Disp: 20 tablet, Rfl: 0 .  levothyroxine (SYNTHROID, LEVOTHROID) 25 MCG tablet, Take 25 mcg by mouth daily before breakfast., Disp: , Rfl:  .  metoprolol tartrate (LOPRESSOR) 25 MG tablet, Take 25 mg by mouth daily., Disp: , Rfl:  .  Multiple Vitamin (MULTIVITAMIN) tablet, Take 1 tablet by mouth daily., Disp: , Rfl:  .  Omega-3 1000 MG CAPS, Take 340-1,000 capsules by mouth 2 (two) times daily., Disp: , Rfl:  .  omeprazole (PRILOSEC) 20 MG capsule, Take 20 mg by mouth daily., Disp: , Rfl:  .  pravastatin (PRAVACHOL) 20 MG tablet, Take 20 mg by mouth daily., Disp: , Rfl:  .  predniSONE (DELTASONE) 5 MG tablet, Take 5 mg by mouth daily with breakfast., Disp: , Rfl:  .  Probiotic Product (PROBIOTIC COMPLEX ACIDOPHILUS PO), Take by mouth., Disp: ,  Rfl:  .  quinapril (ACCUPRIL) 40 MG tablet, Take 40 mg by mouth 1 day or 1 dose., Disp: , Rfl:  .  tamsulosin (FLOMAX) 0.4 MG CAPS capsule, Take 0.4 mg by mouth daily., Disp: , Rfl:  .  traMADol (ULTRAM-ER) 100 MG 24 hr tablet, Take 50 mg by mouth 2 (two) times daily. With lunch and dinner , Disp: , Rfl:  .  traMADol (ULTRAM-ER) 300 MG 24 hr tablet, Take 300 mg by mouth daily. AM dose, Disp: , Rfl:  .  vitamin B-12 (CYANOCOBALAMIN) 1000 MCG tablet, Take 1,500 mcg by mouth daily. , Disp: , Rfl:  No current facility-administered medications for this visit.   Facility-Administered Medications Ordered in Other Visits:  .  dextrose 5 % solution, , Intravenous, Once, Sindy Guadeloupe, MD  Physical exam:  Vitals:   03/25/18 0900  BP: (!) 159/86  Pulse: (!) 58  Resp: 16  Temp: 97.9 F (36.6 C)  TempSrc: Oral  Weight: 182 lb 1.6 oz (82.6 kg)  Height: '5\' 11"'  (1.803 m)   Physical Exam  Constitutional: He is oriented to person, place, and time. He appears well-developed and well-nourished.  HENT:  Head: Normocephalic and atraumatic.  Eyes: Pupils are equal, round, and reactive to light. EOM are normal.  Neck: Normal range of motion.  Cardiovascular: Normal rate, regular rhythm and normal heart sounds.  Pulmonary/Chest: Effort normal and breath sounds normal.  Abdominal: Soft. Bowel sounds are normal.  Neurological: He is alert and oriented to person, place, and time.  Skin: Skin is warm and dry.     CMP Latest Ref Rng & Units 03/25/2018  Glucose 70 - 99 mg/dL 142(H)  BUN 8 - 23 mg/dL 32(H)  Creatinine 0.61 - 1.24 mg/dL 1.15  Sodium 135 - 145 mmol/L 140  Potassium 3.5 - 5.1 mmol/L 3.9  Chloride 98 - 111 mmol/L 107  CO2 22 - 32 mmol/L 25  Calcium 8.9 - 10.3 mg/dL 9.1  Total Protein 6.5 - 8.1 g/dL 6.7  Total Bilirubin 0.3 - 1.2 mg/dL 0.5  Alkaline Phos 38 - 126 U/L 36(L)  AST 15 - 41 U/L 25  ALT 0 - 44 U/L 15   CBC Latest Ref Rng & Units 03/25/2018  WBC 4.0 - 10.5 K/uL 3.2(L)    Hemoglobin 13.0 - 17.0 g/dL 8.6(L)  Hematocrit 39.0 - 52.0 % 26.8(L)  Platelets 150 - 400 K/uL 138(L)    No images are attached to the encounter.  Ct Chest W Contrast  Result Date: 03/04/2018 CLINICAL DATA:  Stage IV esophageal cancer initially diagnosed in February 2019.  Post chemotherapy and radiation therapy. No acute complaints. EXAM: CT CHEST, ABDOMEN, AND PELVIS WITH CONTRAST TECHNIQUE: Multidetector CT imaging of the chest, abdomen and pelvis was performed following the standard protocol during bolus administration of intravenous contrast. CONTRAST:  160m ISOVUE-300 IOPAMIDOL (ISOVUE-300) INJECTION 61% COMPARISON:  CT 12/09/2017.  PET-CT 09/04/2017 FINDINGS: CT CHEST FINDINGS Cardiovascular: Mild atherosclerosis of the aorta, great vessels and coronary arteries. Right IJ Port-A-Cath extends to superior cavoatrial junction. There are dense calcifications of the aortic valve. The heart size is normal. There is no pericardial effusion. Mediastinum/Nodes: There are no enlarged mediastinal, hilar or axillary lymph nodes. There are stable small mediastinal lymph nodes, including a 7 mm node in the high right tracheoesophageal groove (image 9/2). The thyroid gland and trachea appear normal. There is mild distal esophageal wall thickening without focal lesion. Lungs/Pleura: There is no pleural effusion or pneumothorax. There is mild centrilobular and paraseptal emphysema with scattered subpleural reticulation. No suspicious pulmonary nodule. Musculoskeletal/Chest wall: No chest wall mass or suspicious osseous findings. CT ABDOMEN AND PELVIS FINDINGS Hepatobiliary: The liver is normal in density without focal abnormality. No significant biliary dilatation post cholecystectomy. Pancreas: Unremarkable. No pancreatic ductal dilatation or surrounding inflammatory changes. Spleen: Normal in size without focal abnormality. Adrenals/Urinary Tract: Both adrenal glands appear normal. There is no evidence of  urinary tract calculus or hydronephrosis. There is a small left renal parapelvic cysts. Both kidneys otherwise appear normal. The bladder demonstrates mild wall thickening and a right-sided Hutch diverticulum. Stomach/Bowel: There is stable mild wall thickening within the distal esophagus and proximal stomach without focal mass lesion. There is no small or large bowel wall thickening, distention or surrounding inflammation. The appendix appears normal. There is moderate stool throughout the colon. There are mild diverticular changes of the distal colon. Vascular/Lymphatic: There is progressive adenopathy within the gastrohepatic ligament and retroperitoneum. A cluster of nodes in the gastrohepatic ligament measures 21 x 17 mm on image 58/2. There are multiple enlarging left periaortic nodes, including an 11 mm node on image 66/2, a 15 mm node on image 67/2, a 15 mm node on image 72/2 and a 13 mm node on image 73/2. There is no pelvic lymphadenopathy. There is moderate aortic and branch vessel atherosclerosis without acute vascular findings. The portal vein is patent. Reproductive: The prostate gland and seminal vesicles appear normal. Other: Intact anterior abdominal wall. No ascites or peritoneal nodularity. Musculoskeletal: No acute osseous findings or evidence of metastatic disease. Status post L3-5 interbody fusion. There is adjacent segment disease at L2-3 with posterior osteophytes and facet hypertrophy contributing to spinal stenosis. 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 IMPRESSION: 1. Significant progression of lymphadenopathy in the gastrohepatic ligament and retroperitoneum consistent with progressive metastatic esophageal cancer. No thoracic adenopathy identified. 2. Similar mild wall thickening of the distal esophagus and proximal stomach without obvious focal mucosal lesion. 3. No evidence of pulmonary, hepatic  or osseous metastatic disease. 4. Aortic Atherosclerosis (ICD10-I70.0) and Emphysema (ICD10-J43.9). 5. Right-sided bladder diverticulum. 6. L2-3 adjacent segment disease post L3-5 fusion. 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 Electronically Signed   By: WRichardean SaleM.D.   On: 03/04/2018 17:20   Ct Abdomen Pelvis W Contrast  Result Date: 03/04/2018 CLINICAL DATA:  Stage IV esophageal cancer initially diagnosed in February 2019. Post chemotherapy and radiation therapy. No acute complaints. EXAM: CT CHEST, ABDOMEN, AND PELVIS WITH CONTRAST TECHNIQUE: Multidetector CT imaging of  the chest, abdomen and pelvis was performed following the standard protocol during bolus administration of intravenous contrast. CONTRAST:  141m ISOVUE-300 IOPAMIDOL (ISOVUE-300) INJECTION 61% COMPARISON:  CT 12/09/2017.  PET-CT 09/04/2017 FINDINGS: CT CHEST FINDINGS Cardiovascular: Mild atherosclerosis of the aorta, great vessels and coronary arteries. Right IJ Port-A-Cath extends to superior cavoatrial junction. There are dense calcifications of the aortic valve. The heart size is normal. There is no pericardial effusion. Mediastinum/Nodes: There are no enlarged mediastinal, hilar or axillary lymph nodes. There are stable small mediastinal lymph nodes, including a 7 mm node in the high right tracheoesophageal groove (image 9/2). The thyroid gland and trachea appear normal. There is mild distal esophageal wall thickening without focal lesion. Lungs/Pleura: There is no pleural effusion or pneumothorax. There is mild centrilobular and paraseptal emphysema with scattered subpleural reticulation. No suspicious pulmonary nodule. Musculoskeletal/Chest wall: No chest wall mass or suspicious osseous findings. CT ABDOMEN AND PELVIS FINDINGS Hepatobiliary: The liver is normal in density without focal abnormality. No significant biliary dilatation post cholecystectomy. Pancreas: Unremarkable. No pancreatic ductal  dilatation or surrounding inflammatory changes. Spleen: Normal in size without focal abnormality. Adrenals/Urinary Tract: Both adrenal glands appear normal. There is no evidence of urinary tract calculus or hydronephrosis. There is a small left renal parapelvic cysts. Both kidneys otherwise appear normal. The bladder demonstrates mild wall thickening and a right-sided Hutch diverticulum. Stomach/Bowel: There is stable mild wall thickening within the distal esophagus and proximal stomach without focal mass lesion. There is no small or large bowel wall thickening, distention or surrounding inflammation. The appendix appears normal. There is moderate stool throughout the colon. There are mild diverticular changes of the distal colon. Vascular/Lymphatic: There is progressive adenopathy within the gastrohepatic ligament and retroperitoneum. A cluster of nodes in the gastrohepatic ligament measures 21 x 17 mm on image 58/2. There are multiple enlarging left periaortic nodes, including an 11 mm node on image 66/2, a 15 mm node on image 67/2, a 15 mm node on image 72/2 and a 13 mm node on image 73/2. There is no pelvic lymphadenopathy. There is moderate aortic and branch vessel atherosclerosis without acute vascular findings. The portal vein is patent. Reproductive: The prostate gland and seminal vesicles appear normal. Other: Intact anterior abdominal wall. No ascites or peritoneal nodularity. Musculoskeletal: No acute osseous findings or evidence of metastatic disease. Status post L3-5 interbody fusion. There is adjacent segment disease at L2-3 with posterior osteophytes and facet hypertrophy contributing to spinal stenosis. 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 IMPRESSION: 1. Significant progression of lymphadenopathy in the gastrohepatic ligament and retroperitoneum consistent with progressive metastatic esophageal cancer. No  thoracic adenopathy identified. 2. Similar mild wall thickening of the distal esophagus and proximal stomach without obvious focal mucosal lesion. 3. No evidence of pulmonary, hepatic or osseous metastatic disease. 4. Aortic Atherosclerosis (ICD10-I70.0) and Emphysema (ICD10-J43.9). 5. Right-sided bladder diverticulum. 6. L2-3 adjacent segment disease post L3-5 fusion. 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 Electronically Signed   By: WRichardean SaleM.D.   On: 03/04/2018 17:20     Assessment and plan- Patient is a 84y.o. male with stage IV esophageal adenocarcinomacTX N0M1with metastases to the para-aortic lymph nodes/p concurrent chemo/RT with carbo/taxol.Patient then received 2 cycles of palliative FOLFOX chemotherapy but his chemo has been on hold since June 2019 due to chemo induced anemia requiring blood transfusions and interruption in the treatment. 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  It is down down to 1 cycle of infusional 5-FU.  I would like to hold off on giving Procrit for anemia of malignancy and chemotherapy.  I would like to support him with intermittent blood transfusions of his hemoglobin goes to less than 7.   he he will directly proceed for cycle 2 of infusional 5-FU chemotherapy next week.  We have already dropped his 5-FU bolus and oxaliplatin due to his ongoing anemia.  We will repeat CBC about 2 weeks from now for possible transfusion if his hemoglobin is less than 7 on 04/11/2018.  He will be seen on 04/22/2018 with CBC CMP and CEA by covering provider for cycle 3 of infusional 5-FU leucovorin and Herceptin.  He does get udenyca on day 3 of pump disconnect   Visit Diagnosis 1. Malignant neoplasm of lower third of esophagus (HCC)   2. Antineoplastic chemotherapy induced anemia      Dr. Randa Evens, MD, MPH [facility] at  [facility] [phone] 03/25/2018 12:43 PM

## 2018-03-26 LAB — CEA: CEA: 8.6 ng/mL — ABNORMAL HIGH (ref 0.0–4.7)

## 2018-03-30 ENCOUNTER — Other Ambulatory Visit: Payer: Self-pay | Admitting: *Deleted

## 2018-03-30 DIAGNOSIS — C155 Malignant neoplasm of lower third of esophagus: Secondary | ICD-10-CM

## 2018-04-01 ENCOUNTER — Encounter: Payer: Self-pay | Admitting: Oncology

## 2018-04-01 ENCOUNTER — Inpatient Hospital Stay: Payer: Medicare Other

## 2018-04-01 ENCOUNTER — Inpatient Hospital Stay (HOSPITAL_BASED_OUTPATIENT_CLINIC_OR_DEPARTMENT_OTHER): Payer: Medicare Other | Admitting: Oncology

## 2018-04-01 VITALS — BP 146/69 | HR 81 | Temp 98.7°F | Resp 18 | Ht 71.0 in | Wt 183.5 lb

## 2018-04-01 VITALS — BP 163/74 | HR 59 | Temp 95.0°F | Resp 20

## 2018-04-01 DIAGNOSIS — C155 Malignant neoplasm of lower third of esophagus: Secondary | ICD-10-CM

## 2018-04-01 DIAGNOSIS — C159 Malignant neoplasm of esophagus, unspecified: Secondary | ICD-10-CM

## 2018-04-01 DIAGNOSIS — Z5112 Encounter for antineoplastic immunotherapy: Secondary | ICD-10-CM | POA: Diagnosis not present

## 2018-04-01 DIAGNOSIS — Z5111 Encounter for antineoplastic chemotherapy: Secondary | ICD-10-CM

## 2018-04-01 DIAGNOSIS — D6481 Anemia due to antineoplastic chemotherapy: Secondary | ICD-10-CM | POA: Diagnosis not present

## 2018-04-01 DIAGNOSIS — D701 Agranulocytosis secondary to cancer chemotherapy: Secondary | ICD-10-CM | POA: Diagnosis not present

## 2018-04-01 DIAGNOSIS — D649 Anemia, unspecified: Secondary | ICD-10-CM

## 2018-04-01 DIAGNOSIS — T451X5A Adverse effect of antineoplastic and immunosuppressive drugs, initial encounter: Secondary | ICD-10-CM

## 2018-04-01 LAB — CBC WITH DIFFERENTIAL/PLATELET
ABS IMMATURE GRANULOCYTES: 0.05 10*3/uL (ref 0.00–0.07)
Basophils Absolute: 0 10*3/uL (ref 0.0–0.1)
Basophils Relative: 1 %
Eosinophils Absolute: 0.2 10*3/uL (ref 0.0–0.5)
Eosinophils Relative: 7 %
HCT: 23.2 % — ABNORMAL LOW (ref 39.0–52.0)
HEMOGLOBIN: 7.5 g/dL — AB (ref 13.0–17.0)
Immature Granulocytes: 2 %
Lymphocytes Relative: 35 %
Lymphs Abs: 1 10*3/uL (ref 0.7–4.0)
MCH: 32.9 pg (ref 26.0–34.0)
MCHC: 32.3 g/dL (ref 30.0–36.0)
MCV: 101.8 fL — ABNORMAL HIGH (ref 80.0–100.0)
MONOS PCT: 21 %
Monocytes Absolute: 0.6 10*3/uL (ref 0.1–1.0)
Neutro Abs: 1 10*3/uL — ABNORMAL LOW (ref 1.7–7.7)
Neutrophils Relative %: 34 %
Platelets: 181 10*3/uL (ref 150–400)
RBC: 2.28 MIL/uL — ABNORMAL LOW (ref 4.22–5.81)
RDW: 13.9 % (ref 11.5–15.5)
WBC: 2.9 10*3/uL — ABNORMAL LOW (ref 4.0–10.5)
nRBC: 0 % (ref 0.0–0.2)

## 2018-04-01 LAB — COMPREHENSIVE METABOLIC PANEL
ALK PHOS: 39 U/L (ref 38–126)
ALT: 14 U/L (ref 0–44)
AST: 27 U/L (ref 15–41)
Albumin: 3.9 g/dL (ref 3.5–5.0)
Anion gap: 6 (ref 5–15)
BUN: 35 mg/dL — AB (ref 8–23)
CO2: 26 mmol/L (ref 22–32)
CREATININE: 1.19 mg/dL (ref 0.61–1.24)
Calcium: 9.3 mg/dL (ref 8.9–10.3)
Chloride: 107 mmol/L (ref 98–111)
GFR calc Af Amer: >60 mL/min (ref >60)
GFR calc non Af Amer: 57 mL/min — ABNORMAL LOW (ref >60)
Glucose, Bld: 116 mg/dL — ABNORMAL HIGH (ref 70–99)
Potassium: 3.9 mmol/L (ref 3.5–5.1)
Sodium: 139 mmol/L (ref 135–145)
Total Bilirubin: 0.4 mg/dL (ref 0.3–1.2)
Total Protein: 6.5 g/dL (ref 6.5–8.1)

## 2018-04-01 LAB — PREPARE RBC (CROSSMATCH)

## 2018-04-01 MED ORDER — HEPARIN SOD (PORK) LOCK FLUSH 100 UNIT/ML IV SOLN
500.0000 [IU] | Freq: Once | INTRAVENOUS | Status: AC
  Administered 2018-04-01: 500 [IU] via INTRAVENOUS
  Filled 2018-04-01: qty 5

## 2018-04-01 MED ORDER — ACETAMINOPHEN 325 MG PO TABS
650.0000 mg | ORAL_TABLET | Freq: Once | ORAL | Status: AC
  Administered 2018-04-01: 650 mg via ORAL
  Filled 2018-04-01: qty 2

## 2018-04-01 MED ORDER — TBO-FILGRASTIM 480 MCG/0.8ML ~~LOC~~ SOSY
480.0000 ug | PREFILLED_SYRINGE | Freq: Every day | SUBCUTANEOUS | Status: DC
  Administered 2018-04-01: 480 ug via SUBCUTANEOUS
  Filled 2018-04-01: qty 0.8

## 2018-04-01 MED ORDER — SODIUM CHLORIDE 0.9% FLUSH
10.0000 mL | INTRAVENOUS | Status: DC | PRN
  Administered 2018-04-01: 10 mL via INTRAVENOUS
  Filled 2018-04-01: qty 10

## 2018-04-01 MED ORDER — HEPARIN SOD (PORK) LOCK FLUSH 100 UNIT/ML IV SOLN: 500.0000 [IU] | Freq: Every day | INTRAVENOUS | Status: DC | PRN

## 2018-04-01 MED ORDER — SODIUM CHLORIDE 0.9% IV SOLUTION
250.0000 mL | Freq: Once | INTRAVENOUS | Status: AC
  Administered 2018-04-01: 250 mL via INTRAVENOUS
  Filled 2018-04-01: qty 250

## 2018-04-01 NOTE — Progress Notes (Signed)
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 Mass was nonobstructing, partially obstructing and not circumferential. 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 Angiolymphatic invasion was present. Fundic gland polyp was noted in the stomach which was negative for H. pylori dysplasia and malignancy.  Patient underwent PET/CT scan on 05/28/2017 which showed small hypermetabolic mass in the distal esophagus with an SUV of 10.7. Hypermetabolic left periaortic adenopathy compatible with malignancy. Larger of the 2 involved lymph nodes with a maximum SUV of 16.1. No other regions of tumor are identified.  Tumor marker testing shows he is positive for HER-2 and PDL 1 expressed  Biopsy of the left para-aortic lymph node positive for metastatic esophageal cancer  Patient completed concurrent chemo/RT on 07/24/17. He did miss his last dose of chemotherapy due to neutropenia  Repeat PET/Ct scan showed:IMPRESSION: 1. Today's study demonstrates a positive response to therapy with regression of the primary lesion in the distal esophagus and decreased hypermetabolism in retroperitoneal lymph nodes which are essentially unchanged  in size. 2. There is a new focus of hypermetabolism in the proximal stomach just distal to the gastroesophageal junction. This is of uncertain etiology and significance, and could be physiologic, but correlation with endoscopy may be considered.  MUGA scan showed normal EF  Chemotherapy has been on hold since 10/15/17 due to persistent anemia requiring blood transfusions. He only received 2 cycles.  Scans in November 2019 showed progression of adenopathy in the retroperitoneal and gastrohepatic region.  Infusional 5-FU and Herceptin was restarted omitting bolus 5-FU and oxaliplatin   Interval history-overall he feels well and denies any significant complaints other than mild fatigue.  ECOG PS- 1 Pain scale- 0 Opioid associated constipation- no  Review of systems- Review of Systems  Constitutional: Positive for malaise/fatigue. Negative for chills, fever and weight loss.  HENT: Negative for congestion, ear discharge and nosebleeds.   Eyes: Negative for blurred vision.  Respiratory: Negative for cough, hemoptysis, sputum production, shortness of breath and wheezing.   Cardiovascular: Negative for chest pain, palpitations, orthopnea and claudication.  Gastrointestinal: Negative for abdominal pain, blood in stool, constipation, diarrhea, heartburn, melena, nausea and vomiting.  Genitourinary: Negative for dysuria, flank pain, frequency, hematuria and urgency.  Musculoskeletal: Negative for back pain, joint pain and myalgias.  Skin: Negative for rash.  Neurological: Negative for dizziness, tingling, focal weakness, seizures, weakness and headaches.  Endo/Heme/Allergies: Does not bruise/bleed easily.  Psychiatric/Behavioral: Negative for depression and suicidal ideas. The patient does not have insomnia.        Allergies  Allergen Reactions  . Morphine And Related Nausea Only     Past Medical History:  Diagnosis Date  . Anemia   . Arthritis   . Coronary artery disease   .  Diverticulitis   . Dysrhythmia   . Esophageal cancer ([place]) 05/2017   Chemo +  rad tx's.   Marland Kitchen GERD (gastroesophageal reflux disease)   . Heart murmur   . History of hiatal hernia   . Hyperglycemia   . Hyperlipidemia   . Hypertension   . Hypothyroidism   . Spinal stenosis      Past Surgical History:  Procedure Laterality Date  . BACK SURGERY    . CATARACT EXTRACTION W/PHACO Left 11/08/2014   Procedure: CATARACT EXTRACTION PHACO AND INTRAOCULAR LENS PLACEMENT (IOC);  Surgeon: Estill Cotta, MD;  Location: [facility] ORS;  Service: Ophthalmology;  Laterality: Left;  US:01:20 AP:21.8 CDE:31.09 PAC LOT: [id number]  . CHOLECYSTECTOMY    . ESOPHAGOGASTRODUODENOSCOPY (EGD) WITH PROPOFOL N/A 05/23/2017   Procedure: ESOPHAGOGASTRODUODENOSCOPY (EGD) WITH PROPOFOL;  Surgeon: Lollie Sails, MD;  Location: [facility] ENDOSCOPY;  Service: Endoscopy;  Laterality: N/A;  . EYE SURGERY    . IR FLUORO GUIDE PORT INSERTION RIGHT  06/07/2017  . JOINT REPLACEMENT Right    Knee  . ROTATOR CUFF REPAIR Right     Social History   Socioeconomic History  . Marital status: Married    Spouse name: Not on file  . Number of children: Not on file  . Years of education: Not on file  . Highest education level: Not on file  Occupational History  . Not on file  Social Needs  . Financial resource strain: Not on file  . Food insecurity:    Worry: Not on file    Inability: Not on file  . Transportation needs:    Medical: Not on file    Non-medical: Not on file  Tobacco Use  . Smoking status: Former Smoker    Last attempt to quit: 03/24/1989    Years since quitting: 29.0  . Smokeless tobacco: Never Used  Substance and Sexual Activity  . Alcohol use: No    Frequency: Never  . Drug use: No  . Sexual activity: Not on file  Lifestyle  . Physical activity:    Days per week: Not on file    Minutes per session: Not on file  . Stress: Not on file  Relationships  . Social connections:    Talks on phone: Not on  file    Gets together: Not on file    Attends religious service: Not on file    Active member of club or organization: Not on file    Attends meetings of clubs or organizations: Not on file    Relationship status: Not on file  . Intimate partner violence:    Fear of current or ex partner: Not on file    Emotionally abused: Not on file    Physically abused: Not on file    Forced sexual activity: Not on file  Other Topics Concern  . Not on file  Social History Narrative  . Not on file    Family History  Problem Relation Age of Onset  . Colon cancer Mother      Current Outpatient Medications:  .  acetaminophen (TYLENOL) 650 MG CR tablet, Take 650 mg by mouth every 8 (eight) hours as needed for pain., Disp: , Rfl:  .  ascorbic acid (VITAMIN C) 1000 MG tablet, Take 1,000 mg by mouth daily., Disp: , Rfl:  .  docusate sodium (COLACE) 100 MG capsule, Take 100 mg by mouth daily., Disp: , Rfl:  .  levothyroxine (SYNTHROID, LEVOTHROID) 25 MCG tablet, Take 25 mcg by mouth daily before breakfast., Disp: , Rfl:  .  metoprolol tartrate (LOPRESSOR) 50 MG tablet, Take 50 mg by  mouth 2 (two) times daily., Disp: , Rfl:  .  omeprazole (PRILOSEC) 20 MG capsule, Take 20 mg by mouth daily., Disp: , Rfl:  .  pravastatin (PRAVACHOL) 20 MG tablet, Take 20 mg by mouth daily., Disp: , Rfl:  .  predniSONE (DELTASONE) 5 MG tablet, Take 5 mg by mouth daily with breakfast., Disp: , Rfl:  .  Probiotic Product (PROBIOTIC COMPLEX ACIDOPHILUS PO), Take by mouth., Disp: , Rfl:  .  quinapril (ACCUPRIL) 40 MG tablet, Take 40 mg by mouth 1 day or 1 dose., Disp: , Rfl:  .  tamsulosin (FLOMAX) 0.4 MG CAPS capsule, Take 0.4 mg by mouth daily., Disp: , Rfl:  .  traMADol (ULTRAM-ER) 100 MG 24 hr tablet, Take 50 mg by mouth 2 (two) times daily. With lunch and dinner , Disp: , Rfl:  .  traMADol (ULTRAM-ER) 300 MG 24 hr tablet, Take 300 mg by mouth daily. AM dose, Disp: , Rfl:  No current facility-administered medications for  this visit.   Facility-Administered Medications Ordered in Other Visits:  .  dextrose 5 % solution, , Intravenous, Once, Sindy Guadeloupe, MD .  heparin lock flush 100 unit/mL, 500 Units, Intravenous, Once, Sindy Guadeloupe, MD .  sodium chloride flush (NS) 0.9 % injection 10 mL, 10 mL, Intravenous, PRN, Sindy Guadeloupe, MD, 10 mL at 04/01/18 0840  Physical exam:  Vitals:   04/01/18 0855  BP: (!) 146/69  Pulse: 81  Resp: 18  Temp: 98.7 F (37.1 C)  TempSrc: Tympanic  SpO2: 99%  Weight: 183 lb 8 oz (83.2 kg)  Height: '5\' 11"'  (1.803 m)   Physical Exam  Constitutional: He is oriented to person, place, and time. He appears well-developed and well-nourished.  HENT:  Head: Normocephalic and atraumatic.  Eyes: Pupils are equal, round, and reactive to light. EOM are normal.  Neck: Normal range of motion.  Cardiovascular: Normal rate, regular rhythm and normal heart sounds.  Pulmonary/Chest: Effort normal and breath sounds normal.  Abdominal: Soft. Bowel sounds are normal.  Neurological: He is alert and oriented to person, place, and time.  Skin: Skin is warm and dry.     CMP Latest Ref Rng & Units 04/01/2018  Glucose 70 - 99 mg/dL 116(H)  BUN 8 - 23 mg/dL 35(H)  Creatinine 0.61 - 1.24 mg/dL 1.19  Sodium 135 - 145 mmol/L 139  Potassium 3.5 - 5.1 mmol/L 3.9  Chloride 98 - 111 mmol/L 107  CO2 22 - 32 mmol/L 26  Calcium 8.9 - 10.3 mg/dL 9.3  Total Protein 6.5 - 8.1 g/dL 6.5  Total Bilirubin 0.3 - 1.2 mg/dL 0.4  Alkaline Phos 38 - 126 U/L 39  AST 15 - 41 U/L 27  ALT 0 - 44 U/L 14   CBC Latest Ref Rng & Units 04/01/2018  WBC 4.0 - 10.5 K/uL 2.9(L)  Hemoglobin 13.0 - 17.0 g/dL 7.5(L)  Hematocrit 39.0 - 52.0 % 23.2(L)  Platelets 150 - 400 K/uL 181    No images are attached to the encounter.  Ct Chest W Contrast  Result Date: 03/04/2018 CLINICAL DATA:  Stage IV esophageal cancer initially diagnosed in February 2019. Post chemotherapy and radiation therapy. No acute complaints.  EXAM: CT CHEST, ABDOMEN, AND PELVIS WITH CONTRAST TECHNIQUE: Multidetector CT imaging of the chest, abdomen and pelvis was performed following the standard protocol during bolus administration of intravenous contrast. CONTRAST:  155m ISOVUE-300 IOPAMIDOL (ISOVUE-300) INJECTION 61% COMPARISON:  CT 12/09/2017.  PET-CT 09/04/2017 FINDINGS: CT CHEST FINDINGS Cardiovascular: Mild  atherosclerosis of the aorta, great vessels and coronary arteries. Right IJ Port-A-Cath extends to superior cavoatrial junction. There are dense calcifications of the aortic valve. The heart size is normal. There is no pericardial effusion. Mediastinum/Nodes: There are no enlarged mediastinal, hilar or axillary lymph nodes. There are stable small mediastinal lymph nodes, including a 7 mm node in the high right tracheoesophageal groove (image 9/2). The thyroid gland and trachea appear normal. There is mild distal esophageal wall thickening without focal lesion. Lungs/Pleura: There is no pleural effusion or pneumothorax. There is mild centrilobular and paraseptal emphysema with scattered subpleural reticulation. No suspicious pulmonary nodule. Musculoskeletal/Chest wall: No chest wall mass or suspicious osseous findings. CT ABDOMEN AND PELVIS FINDINGS Hepatobiliary: The liver is normal in density without focal abnormality. No significant biliary dilatation post cholecystectomy. Pancreas: Unremarkable. No pancreatic ductal dilatation or surrounding inflammatory changes. Spleen: Normal in size without focal abnormality. Adrenals/Urinary Tract: Both adrenal glands appear normal. There is no evidence of urinary tract calculus or hydronephrosis. There is a small left renal parapelvic cysts. Both kidneys otherwise appear normal. The bladder demonstrates mild wall thickening and a right-sided Hutch diverticulum. Stomach/Bowel: There is stable mild wall thickening within the distal esophagus and proximal stomach without focal mass lesion. There is no  small or large bowel wall thickening, distention or surrounding inflammation. The appendix appears normal. There is moderate stool throughout the colon. There are mild diverticular changes of the distal colon. Vascular/Lymphatic: There is progressive adenopathy within the gastrohepatic ligament and retroperitoneum. A cluster of nodes in the gastrohepatic ligament measures 21 x 17 mm on image 58/2. There are multiple enlarging left periaortic nodes, including an 11 mm node on image 66/2, a 15 mm node on image 67/2, a 15 mm node on image 72/2 and a 13 mm node on image 73/2. There is no pelvic lymphadenopathy. There is moderate aortic and branch vessel atherosclerosis without acute vascular findings. The portal vein is patent. Reproductive: The prostate gland and seminal vesicles appear normal. Other: Intact anterior abdominal wall. No ascites or peritoneal nodularity. Musculoskeletal: No acute osseous findings or evidence of metastatic disease. Status post L3-5 interbody fusion. There is adjacent segment disease at L2-3 with posterior osteophytes and facet hypertrophy contributing to spinal stenosis. 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 IMPRESSION: 1. Significant progression of lymphadenopathy in the gastrohepatic ligament and retroperitoneum consistent with progressive metastatic esophageal cancer. No thoracic adenopathy identified. 2. Similar mild wall thickening of the distal esophagus and proximal stomach without obvious focal mucosal lesion. 3. No evidence of pulmonary, hepatic or osseous metastatic disease. 4. Aortic Atherosclerosis (ICD10-I70.0) and Emphysema (ICD10-J43.9). 5. Right-sided bladder diverticulum. 6. L2-3 adjacent segment disease post L3-5 fusion. 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 Electronically Signed   By: Richardean Sale M.D.   On: 03/04/2018 17:20   Ct Abdomen Pelvis W  Contrast  Result Date: 03/04/2018 CLINICAL DATA:  Stage IV esophageal cancer initially diagnosed in February 2019. Post chemotherapy and radiation therapy. No acute complaints. EXAM: CT CHEST, ABDOMEN, AND PELVIS WITH CONTRAST TECHNIQUE: Multidetector CT imaging of the chest, abdomen and pelvis was performed following the standard protocol during bolus administration of intravenous contrast. CONTRAST:  157m ISOVUE-300 IOPAMIDOL (ISOVUE-300) INJECTION 61% COMPARISON:  CT 12/09/2017.  PET-CT 09/04/2017 FINDINGS: CT CHEST FINDINGS Cardiovascular: Mild atherosclerosis of the aorta, great vessels and coronary arteries. Right IJ Port-A-Cath extends to superior cavoatrial junction. There are dense calcifications  of the aortic valve. The heart size is normal. There is no pericardial effusion. Mediastinum/Nodes: There are no enlarged mediastinal, hilar or axillary lymph nodes. There are stable small mediastinal lymph nodes, including a 7 mm node in the high right tracheoesophageal groove (image 9/2). The thyroid gland and trachea appear normal. There is mild distal esophageal wall thickening without focal lesion. Lungs/Pleura: There is no pleural effusion or pneumothorax. There is mild centrilobular and paraseptal emphysema with scattered subpleural reticulation. No suspicious pulmonary nodule. Musculoskeletal/Chest wall: No chest wall mass or suspicious osseous findings. CT ABDOMEN AND PELVIS FINDINGS Hepatobiliary: The liver is normal in density without focal abnormality. No significant biliary dilatation post cholecystectomy. Pancreas: Unremarkable. No pancreatic ductal dilatation or surrounding inflammatory changes. Spleen: Normal in size without focal abnormality. Adrenals/Urinary Tract: Both adrenal glands appear normal. There is no evidence of urinary tract calculus or hydronephrosis. There is a small left renal parapelvic cysts. Both kidneys otherwise appear normal. The bladder demonstrates mild wall thickening  and a right-sided Hutch diverticulum. Stomach/Bowel: There is stable mild wall thickening within the distal esophagus and proximal stomach without focal mass lesion. There is no small or large bowel wall thickening, distention or surrounding inflammation. The appendix appears normal. There is moderate stool throughout the colon. There are mild diverticular changes of the distal colon. Vascular/Lymphatic: There is progressive adenopathy within the gastrohepatic ligament and retroperitoneum. A cluster of nodes in the gastrohepatic ligament measures 21 x 17 mm on image 58/2. There are multiple enlarging left periaortic nodes, including an 11 mm node on image 66/2, a 15 mm node on image 67/2, a 15 mm node on image 72/2 and a 13 mm node on image 73/2. There is no pelvic lymphadenopathy. There is moderate aortic and branch vessel atherosclerosis without acute vascular findings. The portal vein is patent. Reproductive: The prostate gland and seminal vesicles appear normal. Other: Intact anterior abdominal wall. No ascites or peritoneal nodularity. Musculoskeletal: No acute osseous findings or evidence of metastatic disease. Status post L3-5 interbody fusion. There is adjacent segment disease at L2-3 with posterior osteophytes and facet hypertrophy contributing to spinal stenosis. 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 IMPRESSION: 1. Significant progression of lymphadenopathy in the gastrohepatic ligament and retroperitoneum consistent with progressive metastatic esophageal cancer. No thoracic adenopathy identified. 2. Similar mild wall thickening of the distal esophagus and proximal stomach without obvious focal mucosal lesion. 3. No evidence of pulmonary, hepatic or osseous metastatic disease. 4. Aortic Atherosclerosis (ICD10-I70.0) and Emphysema (ICD10-J43.9). 5. Right-sided bladder diverticulum. 6. L2-3 adjacent segment disease  post L3-5 fusion. 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 Electronically Signed   By: Richardean Sale M.D.   On: 03/04/2018 17:20     Assessment and plan- Patient is a 84 y.o. male with stage IV esophageal adenocarcinomacTX N0M1with metastases to the para-aortic lymph nodes/p concurrent chemo/RT with carbo/taxol.Patient then received 2 cycles of palliative FOLFOX chemotherapy but his chemo was on hold since June 2019 due to chemo induced anemia requiring blood transfusions and interruption in the treatment and was restarted in November 29 when he had evidence of disease progression.  He is significantly anemic today with a hemoglobin of 7.5.  His white count is down to 2.9 with an [place] of 1.  I will therefore hold his chemotherapy today and give him 1 unit of PRBC transfusion.  He will also receive 1 dose of Neupogen today and 1  dose of Neupogen tomorrow.  We will repeat his CBC with differential on 04/03/2018 to see if he needs another dose of Neupogen on that day.  I will see him back in 1 week's time for cycle 2 of infusional 5-FU chemotherapy and I may have to cut down his infusional 5-FU dose to 2000 mg/m given his neutropenia and anemia.  He will also receive Neulasta on day 3 of pump disconnect at that time   Visit Diagnosis 1. Symptomatic anemia   2. Malignant neoplasm of lower third of esophagus (HCC)   3. Antineoplastic chemotherapy induced anemia   4. Chemotherapy induced neutropenia (HCC)      Dr. Randa Evens, MD, MPH [facility] [phone] 04/01/2018 1:09 PM

## 2018-04-01 NOTE — Progress Notes (Signed)
No new changes noted today 

## 2018-04-02 ENCOUNTER — Inpatient Hospital Stay: Payer: Medicare Other

## 2018-04-02 DIAGNOSIS — C159 Malignant neoplasm of esophagus, unspecified: Secondary | ICD-10-CM

## 2018-04-02 DIAGNOSIS — Z5111 Encounter for antineoplastic chemotherapy: Secondary | ICD-10-CM

## 2018-04-02 DIAGNOSIS — Z5112 Encounter for antineoplastic immunotherapy: Secondary | ICD-10-CM | POA: Diagnosis not present

## 2018-04-02 MED ORDER — TBO-FILGRASTIM 480 MCG/0.8ML ~~LOC~~ SOSY
480.0000 ug | PREFILLED_SYRINGE | Freq: Every day | SUBCUTANEOUS | Status: DC
  Administered 2018-04-02: 480 ug via SUBCUTANEOUS

## 2018-04-03 ENCOUNTER — Inpatient Hospital Stay: Payer: Medicare Other

## 2018-04-03 ENCOUNTER — Other Ambulatory Visit: Payer: Self-pay

## 2018-04-03 DIAGNOSIS — Z5112 Encounter for antineoplastic immunotherapy: Secondary | ICD-10-CM | POA: Diagnosis not present

## 2018-04-03 DIAGNOSIS — C155 Malignant neoplasm of lower third of esophagus: Secondary | ICD-10-CM

## 2018-04-03 LAB — CBC WITH DIFFERENTIAL/PLATELET
Abs Immature Granulocytes: 2.09 10*3/uL — ABNORMAL HIGH (ref 0.00–0.07)
Basophils Absolute: 0.1 10*3/uL (ref 0.0–0.1)
Basophils Relative: 1 %
Eosinophils Absolute: 0.4 10*3/uL (ref 0.0–0.5)
Eosinophils Relative: 2 %
HCT: 26.8 % — ABNORMAL LOW (ref 39.0–52.0)
Hemoglobin: 8.5 g/dL — ABNORMAL LOW (ref 13.0–17.0)
Immature Granulocytes: 11 %
LYMPHS ABS: 1.5 10*3/uL (ref 0.7–4.0)
Lymphocytes Relative: 8 %
MCH: 31.7 pg (ref 26.0–34.0)
MCHC: 31.7 g/dL (ref 30.0–36.0)
MCV: 100 fL (ref 80.0–100.0)
Monocytes Absolute: 2.1 10*3/uL — ABNORMAL HIGH (ref 0.1–1.0)
Monocytes Relative: 11 %
NRBC: 0.2 % (ref 0.0–0.2)
Neutro Abs: 12.8 10*3/uL — ABNORMAL HIGH (ref 1.7–7.7)
Neutrophils Relative %: 67 %
Platelets: 223 10*3/uL (ref 150–400)
RBC: 2.68 MIL/uL — ABNORMAL LOW (ref 4.22–5.81)
RDW: 16.9 % — ABNORMAL HIGH (ref 11.5–15.5)
WBC: 18.9 10*3/uL — ABNORMAL HIGH (ref 4.0–10.5)

## 2018-04-07 ENCOUNTER — Inpatient Hospital Stay: Payer: Medicare Other

## 2018-04-07 ENCOUNTER — Other Ambulatory Visit: Payer: Self-pay | Admitting: Oncology

## 2018-04-07 VITALS — BP 170/67 | HR 66 | Temp 96.0°F | Resp 20

## 2018-04-07 DIAGNOSIS — Z5112 Encounter for antineoplastic immunotherapy: Secondary | ICD-10-CM | POA: Diagnosis not present

## 2018-04-07 DIAGNOSIS — C159 Malignant neoplasm of esophagus, unspecified: Secondary | ICD-10-CM

## 2018-04-07 DIAGNOSIS — C155 Malignant neoplasm of lower third of esophagus: Secondary | ICD-10-CM

## 2018-04-07 LAB — TYPE AND SCREEN
ABO/RH(D): A POS
Antibody Screen: POSITIVE
Unit division: 0

## 2018-04-07 LAB — BPAM RBC
Blood Product Expiration Date: 201912302359
ISSUE DATE / TIME: 201912101441
Unit Type and Rh: 6200

## 2018-04-07 MED ORDER — LEUCOVORIN CALCIUM INJECTION 100 MG
20.0000 mg/m2 | Freq: Once | INTRAMUSCULAR | Status: AC
  Administered 2018-04-07: 40 mg via INTRAVENOUS
  Filled 2018-04-07: qty 2

## 2018-04-07 MED ORDER — SODIUM CHLORIDE 0.9 % IV SOLN
Freq: Once | INTRAVENOUS | Status: AC
  Administered 2018-04-07: 11:00:00 via INTRAVENOUS
  Filled 2018-04-07: qty 250

## 2018-04-07 MED ORDER — PALONOSETRON HCL INJECTION 0.25 MG/5ML
0.2500 mg | Freq: Once | INTRAVENOUS | Status: AC
  Administered 2018-04-07: 0.25 mg via INTRAVENOUS
  Filled 2018-04-07: qty 5

## 2018-04-07 MED ORDER — ACETAMINOPHEN 325 MG PO TABS
650.0000 mg | ORAL_TABLET | Freq: Once | ORAL | Status: AC
  Administered 2018-04-07: 650 mg via ORAL
  Filled 2018-04-07: qty 2

## 2018-04-07 MED ORDER — DEXAMETHASONE SODIUM PHOSPHATE 10 MG/ML IJ SOLN
10.0000 mg | Freq: Once | INTRAMUSCULAR | Status: AC
  Administered 2018-04-07: 10 mg via INTRAVENOUS
  Filled 2018-04-07: qty 1

## 2018-04-07 MED ORDER — DIPHENHYDRAMINE HCL 25 MG PO CAPS
50.0000 mg | ORAL_CAPSULE | Freq: Once | ORAL | Status: AC
  Administered 2018-04-07: 50 mg via ORAL
  Filled 2018-04-07: qty 2

## 2018-04-07 MED ORDER — SODIUM CHLORIDE 0.9 % IV SOLN
2000.0000 mg/m2 | INTRAVENOUS | Status: DC
  Administered 2018-04-07: 4000 mg via INTRAVENOUS
  Filled 2018-04-07: qty 80

## 2018-04-07 MED ORDER — TRASTUZUMAB CHEMO 150 MG IV SOLR
6.0000 mg/kg | Freq: Once | INTRAVENOUS | Status: AC
  Administered 2018-04-07: 483 mg via INTRAVENOUS
  Filled 2018-04-07: qty 23

## 2018-04-07 NOTE — Progress Notes (Signed)
Per Dr. Janese Banks, no labs needed for today, patient is getting herceptin, leucovorin and 5FU pump today.

## 2018-04-09 ENCOUNTER — Inpatient Hospital Stay: Payer: Medicare Other

## 2018-04-09 DIAGNOSIS — C159 Malignant neoplasm of esophagus, unspecified: Secondary | ICD-10-CM

## 2018-04-09 DIAGNOSIS — Z5112 Encounter for antineoplastic immunotherapy: Secondary | ICD-10-CM | POA: Diagnosis not present

## 2018-04-09 MED ORDER — SODIUM CHLORIDE 0.9% FLUSH
10.0000 mL | INTRAVENOUS | Status: DC | PRN
  Administered 2018-04-09: 10 mL via INTRAVENOUS
  Filled 2018-04-09: qty 10

## 2018-04-09 MED ORDER — HEPARIN SOD (PORK) LOCK FLUSH 100 UNIT/ML IV SOLN
500.0000 [IU] | Freq: Once | INTRAVENOUS | Status: AC
  Administered 2018-04-09: 500 [IU] via INTRAVENOUS
  Filled 2018-04-09: qty 5

## 2018-04-09 MED ORDER — PEGFILGRASTIM-CBQV 6 MG/0.6ML ~~LOC~~ SOSY
6.0000 mg | PREFILLED_SYRINGE | Freq: Once | SUBCUTANEOUS | Status: AC
  Administered 2018-04-09: 6 mg via SUBCUTANEOUS
  Filled 2018-04-09: qty 0.6

## 2018-04-11 ENCOUNTER — Ambulatory Visit: Payer: Medicare Other

## 2018-04-11 ENCOUNTER — Other Ambulatory Visit: Payer: Medicare Other

## 2018-04-22 ENCOUNTER — Ambulatory Visit: Payer: Medicare Other

## 2018-04-22 ENCOUNTER — Ambulatory Visit: Payer: Medicare Other | Admitting: Nurse Practitioner

## 2018-04-22 ENCOUNTER — Other Ambulatory Visit: Payer: Medicare Other

## 2018-04-24 ENCOUNTER — Other Ambulatory Visit: Payer: Self-pay | Admitting: Oncology

## 2018-04-24 ENCOUNTER — Other Ambulatory Visit: Payer: Self-pay

## 2018-04-24 ENCOUNTER — Inpatient Hospital Stay: Payer: Medicare Other | Attending: Oncology

## 2018-04-24 ENCOUNTER — Other Ambulatory Visit: Payer: Self-pay | Admitting: *Deleted

## 2018-04-24 ENCOUNTER — Telehealth: Payer: Self-pay | Admitting: *Deleted

## 2018-04-24 DIAGNOSIS — C155 Malignant neoplasm of lower third of esophagus: Secondary | ICD-10-CM

## 2018-04-24 DIAGNOSIS — D649 Anemia, unspecified: Secondary | ICD-10-CM

## 2018-04-24 DIAGNOSIS — E039 Hypothyroidism, unspecified: Secondary | ICD-10-CM | POA: Insufficient documentation

## 2018-04-24 DIAGNOSIS — D6481 Anemia due to antineoplastic chemotherapy: Secondary | ICD-10-CM | POA: Insufficient documentation

## 2018-04-24 DIAGNOSIS — Z5189 Encounter for other specified aftercare: Secondary | ICD-10-CM | POA: Insufficient documentation

## 2018-04-24 DIAGNOSIS — Z5111 Encounter for antineoplastic chemotherapy: Secondary | ICD-10-CM | POA: Insufficient documentation

## 2018-04-24 DIAGNOSIS — Z452 Encounter for adjustment and management of vascular access device: Secondary | ICD-10-CM | POA: Diagnosis not present

## 2018-04-24 DIAGNOSIS — Z5112 Encounter for antineoplastic immunotherapy: Secondary | ICD-10-CM | POA: Insufficient documentation

## 2018-04-24 LAB — CBC WITH DIFFERENTIAL/PLATELET
Abs Immature Granulocytes: 0.1 10*3/uL — ABNORMAL HIGH (ref 0.00–0.07)
Basophils Absolute: 0 10*3/uL (ref 0.0–0.1)
Basophils Relative: 0 %
Eosinophils Absolute: 0.2 10*3/uL (ref 0.0–0.5)
Eosinophils Relative: 3 %
HCT: 24.1 % — ABNORMAL LOW (ref 39.0–52.0)
Hemoglobin: 7.6 g/dL — ABNORMAL LOW (ref 13.0–17.0)
Immature Granulocytes: 1 %
LYMPHS PCT: 13 %
Lymphs Abs: 0.9 10*3/uL (ref 0.7–4.0)
MCH: 31.5 pg (ref 26.0–34.0)
MCHC: 31.5 g/dL (ref 30.0–36.0)
MCV: 100 fL (ref 80.0–100.0)
Monocytes Absolute: 0.6 10*3/uL (ref 0.1–1.0)
Monocytes Relative: 8 %
Neutro Abs: 5.2 10*3/uL (ref 1.7–7.7)
Neutrophils Relative %: 75 %
Platelets: 252 10*3/uL (ref 150–400)
RBC: 2.41 MIL/uL — ABNORMAL LOW (ref 4.22–5.81)
RDW: 15.2 % (ref 11.5–15.5)
WBC: 7.1 10*3/uL (ref 4.0–10.5)
nRBC: 0 % (ref 0.0–0.2)

## 2018-04-24 LAB — SAMPLE TO BLOOD BANK

## 2018-04-24 LAB — PREPARE RBC (CROSSMATCH)

## 2018-04-24 NOTE — Telephone Encounter (Signed)
Patient's house and got the busy signal x3.  Called his voicemail and left a message that his hemoglobin is 7.6 today and we have entered orders for blood transfusion tomorrow so he will need to keep his appointment tomorrow for blood

## 2018-04-25 ENCOUNTER — Other Ambulatory Visit: Payer: Medicare Other

## 2018-04-25 ENCOUNTER — Inpatient Hospital Stay: Payer: Medicare Other

## 2018-04-25 DIAGNOSIS — Z5112 Encounter for antineoplastic immunotherapy: Secondary | ICD-10-CM | POA: Diagnosis not present

## 2018-04-25 DIAGNOSIS — C155 Malignant neoplasm of lower third of esophagus: Secondary | ICD-10-CM

## 2018-04-25 DIAGNOSIS — D649 Anemia, unspecified: Secondary | ICD-10-CM

## 2018-04-25 MED ORDER — HEPARIN SOD (PORK) LOCK FLUSH 100 UNIT/ML IV SOLN
500.0000 [IU] | Freq: Every day | INTRAVENOUS | Status: AC | PRN
  Administered 2018-04-25: 500 [IU]
  Filled 2018-04-25: qty 5

## 2018-04-25 MED ORDER — ACETAMINOPHEN 325 MG PO TABS
650.0000 mg | ORAL_TABLET | Freq: Once | ORAL | Status: AC
  Administered 2018-04-25: 650 mg via ORAL
  Filled 2018-04-25: qty 2

## 2018-04-25 MED ORDER — SODIUM CHLORIDE 0.9% IV SOLUTION
250.0000 mL | Freq: Once | INTRAVENOUS | Status: AC
  Administered 2018-04-25: 250 mL via INTRAVENOUS
  Filled 2018-04-25: qty 250

## 2018-04-26 LAB — BPAM RBC
Blood Product Expiration Date: 202001232359
ISSUE DATE / TIME: 202001030927
Unit Type and Rh: 6200

## 2018-04-26 LAB — TYPE AND SCREEN
ABO/RH(D): A POS
Antibody Screen: NEGATIVE
Unit division: 0

## 2018-04-28 ENCOUNTER — Inpatient Hospital Stay (HOSPITAL_BASED_OUTPATIENT_CLINIC_OR_DEPARTMENT_OTHER): Payer: Medicare Other | Admitting: Oncology

## 2018-04-28 ENCOUNTER — Inpatient Hospital Stay: Payer: Medicare Other

## 2018-04-28 ENCOUNTER — Encounter: Payer: Self-pay | Admitting: Oncology

## 2018-04-28 VITALS — BP 181/69 | HR 67 | Temp 96.7°F | Resp 18 | Ht 71.0 in | Wt 183.4 lb

## 2018-04-28 VITALS — BP 171/69 | HR 71 | Resp 18

## 2018-04-28 DIAGNOSIS — C159 Malignant neoplasm of esophagus, unspecified: Secondary | ICD-10-CM

## 2018-04-28 DIAGNOSIS — T451X5A Adverse effect of antineoplastic and immunosuppressive drugs, initial encounter: Secondary | ICD-10-CM

## 2018-04-28 DIAGNOSIS — E039 Hypothyroidism, unspecified: Secondary | ICD-10-CM | POA: Diagnosis not present

## 2018-04-28 DIAGNOSIS — Z5111 Encounter for antineoplastic chemotherapy: Secondary | ICD-10-CM

## 2018-04-28 DIAGNOSIS — D649 Anemia, unspecified: Secondary | ICD-10-CM

## 2018-04-28 DIAGNOSIS — Z5112 Encounter for antineoplastic immunotherapy: Secondary | ICD-10-CM | POA: Diagnosis not present

## 2018-04-28 DIAGNOSIS — C155 Malignant neoplasm of lower third of esophagus: Secondary | ICD-10-CM

## 2018-04-28 DIAGNOSIS — D6481 Anemia due to antineoplastic chemotherapy: Secondary | ICD-10-CM

## 2018-04-28 LAB — COMPREHENSIVE METABOLIC PANEL
ALT: 12 U/L (ref 0–44)
AST: 26 U/L (ref 15–41)
Albumin: 3.7 g/dL (ref 3.5–5.0)
Alkaline Phosphatase: 56 U/L (ref 38–126)
Anion gap: 8 (ref 5–15)
BUN: 25 mg/dL — ABNORMAL HIGH (ref 8–23)
CO2: 26 mmol/L (ref 22–32)
Calcium: 9.1 mg/dL (ref 8.9–10.3)
Chloride: 107 mmol/L (ref 98–111)
Creatinine, Ser: 1.27 mg/dL — ABNORMAL HIGH (ref 0.61–1.24)
GFR calc Af Amer: >60 mL/min (ref >60)
GFR calc non Af Amer: 52 mL/min — ABNORMAL LOW (ref >60)
Glucose, Bld: 156 mg/dL — ABNORMAL HIGH (ref 70–99)
POTASSIUM: 3.9 mmol/L (ref 3.5–5.1)
Sodium: 141 mmol/L (ref 135–145)
Total Bilirubin: 0.4 mg/dL (ref 0.3–1.2)
Total Protein: 7 g/dL (ref 6.5–8.1)

## 2018-04-28 LAB — CBC WITH DIFFERENTIAL/PLATELET
Abs Immature Granulocytes: 0.06 10*3/uL (ref 0.00–0.07)
Basophils Absolute: 0 10*3/uL (ref 0.0–0.1)
Basophils Relative: 1 %
Eosinophils Absolute: 0.2 10*3/uL (ref 0.0–0.5)
Eosinophils Relative: 4 %
HCT: 28.8 % — ABNORMAL LOW (ref 39.0–52.0)
Hemoglobin: 9 g/dL — ABNORMAL LOW (ref 13.0–17.0)
Immature Granulocytes: 1 %
LYMPHS PCT: 20 %
Lymphs Abs: 1.1 10*3/uL (ref 0.7–4.0)
MCH: 30.5 pg (ref 26.0–34.0)
MCHC: 31.3 g/dL (ref 30.0–36.0)
MCV: 97.6 fL (ref 80.0–100.0)
Monocytes Absolute: 0.5 10*3/uL (ref 0.1–1.0)
Monocytes Relative: 10 %
NEUTROS ABS: 3.4 10*3/uL (ref 1.7–7.7)
Neutrophils Relative %: 64 %
Platelets: 286 10*3/uL (ref 150–400)
RBC: 2.95 MIL/uL — AB (ref 4.22–5.81)
RDW: 15.5 % (ref 11.5–15.5)
WBC: 5.3 10*3/uL (ref 4.0–10.5)
nRBC: 0 % (ref 0.0–0.2)

## 2018-04-28 MED ORDER — SODIUM CHLORIDE 0.9 % IV SOLN
2000.0000 mg/m2 | INTRAVENOUS | Status: DC
  Administered 2018-04-28: 4000 mg via INTRAVENOUS
  Filled 2018-04-28: qty 80

## 2018-04-28 MED ORDER — DIPHENHYDRAMINE HCL 25 MG PO CAPS
50.0000 mg | ORAL_CAPSULE | Freq: Once | ORAL | Status: AC
  Administered 2018-04-28: 50 mg via ORAL
  Filled 2018-04-28: qty 2

## 2018-04-28 MED ORDER — LEUCOVORIN CALCIUM INJECTION 100 MG
20.0000 mg/m2 | Freq: Once | INTRAMUSCULAR | Status: AC
  Administered 2018-04-28: 40 mg via INTRAVENOUS
  Filled 2018-04-28: qty 2

## 2018-04-28 MED ORDER — ACETAMINOPHEN 325 MG PO TABS
650.0000 mg | ORAL_TABLET | Freq: Once | ORAL | Status: AC
  Administered 2018-04-28: 650 mg via ORAL
  Filled 2018-04-28: qty 2

## 2018-04-28 MED ORDER — DEXAMETHASONE SODIUM PHOSPHATE 10 MG/ML IJ SOLN
10.0000 mg | Freq: Once | INTRAMUSCULAR | Status: AC
  Administered 2018-04-28: 10 mg via INTRAVENOUS
  Filled 2018-04-28: qty 1

## 2018-04-28 MED ORDER — SODIUM CHLORIDE 0.9 % IV SOLN
Freq: Once | INTRAVENOUS | Status: AC
  Administered 2018-04-28: 10:00:00 via INTRAVENOUS
  Filled 2018-04-28: qty 250

## 2018-04-28 MED ORDER — TRASTUZUMAB CHEMO 150 MG IV SOLR
6.0000 mg/kg | Freq: Once | INTRAVENOUS | Status: AC
  Administered 2018-04-28: 483 mg via INTRAVENOUS
  Filled 2018-04-28: qty 23

## 2018-04-28 MED ORDER — PALONOSETRON HCL INJECTION 0.25 MG/5ML
0.2500 mg | Freq: Once | INTRAVENOUS | Status: AC
  Administered 2018-04-28: 0.25 mg via INTRAVENOUS
  Filled 2018-04-28: qty 5

## 2018-04-28 NOTE — Progress Notes (Signed)
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 Mass was nonobstructing, partially obstructing and not circumferential. 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 Angiolymphatic invasion was present. Fundic gland polyp was noted in the stomach which was negative for H. pylori dysplasia and malignancy.  Patient underwent PET/CT scan on 05/28/2017 which showed small hypermetabolic mass in the distal esophagus with an SUV of 10.7. Hypermetabolic left periaortic adenopathy compatible with malignancy. Larger of the 2 involved lymph nodes with a maximum SUV of 16.1. No other regions of tumor are identified.  Tumor marker testing shows he is positive for HER-2 and PDL 1 expressed  Biopsy of the left para-aortic lymph node positive for metastatic esophageal cancer  Patient completed concurrent chemo/RT on 07/24/17. He did miss his last dose of chemotherapy due to neutropenia  Repeat PET/Ct scan showed:IMPRESSION: 1. Today's study demonstrates a positive response to therapy with regression of the primary lesion in the distal esophagus and decreased hypermetabolism in retroperitoneal lymph nodes which are essentially unchanged in  size. 2. There is a new focus of hypermetabolism in the proximal stomach just distal to the gastroesophageal junction. This is of uncertain etiology and significance, and could be physiologic, but correlation with endoscopy may be considered.  MUGA scan showed normal EF  Chemotherapy has been on hold since 10/15/17 due to persistent anemia requiring blood transfusions. He only received 2 cycles.Scans in November 2019 showed progression of adenopathy in the retroperitoneal and gastrohepatic region. Infusional 5-FU and Herceptin was restarted omitting bolus 5-FU and oxaliplatin.  He has still been requiring intermittent blood transfusions due to his baseline anemia with a hemoglobin of 10 and a probable component of MDS given his age   Interval history-patient received 1 unit of PRBC 3 days ago.  Overall he feels well and denies any complaints today.  He does have mild chronic fatigue.  Appetite is good and he denies any unintentional weight loss.  Denies any nausea or vomiting  ECOG PS- 1 Pain scale- 0 Opioid associated constipation- no  Review of systems- Review of Systems  Constitutional: Positive for malaise/fatigue. Negative for chills, fever and weight loss.  HENT: Negative for congestion, ear discharge and nosebleeds.   Eyes: Negative for blurred vision.  Respiratory: Negative for cough, hemoptysis, sputum production, shortness of breath and wheezing.   Cardiovascular: Negative for chest pain, palpitations, orthopnea and claudication.  Gastrointestinal: Negative for abdominal pain, blood in stool, constipation, diarrhea, heartburn, melena, nausea and vomiting.  Genitourinary: Negative for dysuria, flank pain, frequency, hematuria and urgency.  Musculoskeletal: Negative for back pain, joint pain and myalgias.  Skin: Negative for rash.  Neurological: Negative for dizziness, tingling, focal weakness, seizures, weakness and headaches.  Endo/Heme/Allergies: Does not bruise/bleed  easily.  Psychiatric/Behavioral: Negative for depression and suicidal ideas. The patient does not have insomnia.  Allergies  Allergen Reactions  . Morphine And Related Nausea Only     Past Medical History:  Diagnosis Date  . Anemia   . Arthritis   . Coronary artery disease   . Diverticulitis   . Dysrhythmia   . Esophageal cancer ([place]) 05/2017   Chemo + rad tx's.   Marland Kitchen GERD (gastroesophageal reflux disease)   . Heart murmur   . History of hiatal hernia   . Hyperglycemia   . Hyperlipidemia   . Hypertension   . Hypothyroidism   . Spinal stenosis      Past Surgical History:  Procedure Laterality Date  . BACK SURGERY    . CATARACT EXTRACTION W/PHACO Left 11/08/2014   Procedure: CATARACT EXTRACTION PHACO AND INTRAOCULAR LENS PLACEMENT (IOC);  Surgeon: Estill Cotta, MD;  Location: [facility] ORS;  Service: Ophthalmology;  Laterality: Left;  US:01:20 AP:21.8 CDE:31.09 PAC LOT: [id number]  . CHOLECYSTECTOMY    . ESOPHAGOGASTRODUODENOSCOPY (EGD) WITH PROPOFOL N/A 05/23/2017   Procedure: ESOPHAGOGASTRODUODENOSCOPY (EGD) WITH PROPOFOL;  Surgeon: Lollie Sails, MD;  [facility] ENDOSCOPY;  Service: Endoscopy;  Laterality: N/A;  . EYE SURGERY    . IR FLUORO GUIDE PORT INSERTION RIGHT  06/07/2017  . JOINT REPLACEMENT Right    Knee  . ROTATOR CUFF REPAIR Right     Social History   Socioeconomic History  . Marital status: Married    Spouse name: Not on file  . Number of children: Not on file  . Years of education: Not on file  . Highest education level: Not on file  Occupational History  . Not on file  Social Needs  . Financial resource strain: Not on file  . Food insecurity:    Worry: Not on file    Inability: Not on file  . Transportation needs:    Medical: Not on file    Non-medical: Not on file  Tobacco Use  . Smoking status: Former Smoker    Last attempt to quit: 03/24/1989    Years since quitting: 29.1  . Smokeless tobacco: Never Used  Substance and  Sexual Activity  . Alcohol use: No    Frequency: Never  . Drug use: No  . Sexual activity: Not Currently  Lifestyle  . Physical activity:    Days per week: Not on file    Minutes per session: Not on file  . Stress: Not on file  Relationships  . Social connections:    Talks on phone: Not on file    Gets together: Not on file    Attends religious service: Not on file    Active member of club or organization: Not on file    Attends meetings of clubs or organizations: Not on file    Relationship status: Not on file  . Intimate partner violence:    Fear of current or ex partner: Not on file    Emotionally abused: Not on file    Physically abused: Not on file    Forced sexual activity: Not on file  Other Topics Concern  . Not on file  Social History Narrative  . Not on file    Family History  Problem Relation Age of Onset  . Colon cancer Mother      Current Outpatient Medications:  .  acetaminophen (TYLENOL) 650 MG CR tablet, Take 650 mg by mouth every 8 (eight) hours as needed for pain., Disp: , Rfl:  .  ascorbic acid (VITAMIN C) 1000 MG tablet, Take 1,000 mg by mouth daily., Disp: ,  Rfl:  .  docusate sodium (COLACE) 100 MG capsule, Take 100 mg by mouth daily., Disp: , Rfl:  .  levothyroxine (SYNTHROID, LEVOTHROID) 25 MCG tablet, Take 25 mcg by mouth daily before breakfast., Disp: , Rfl:  .  metoprolol tartrate (LOPRESSOR) 50 MG tablet, Take 50 mg by mouth 2 (two) times daily., Disp: , Rfl:  .  omeprazole (PRILOSEC) 20 MG capsule, Take 20 mg by mouth daily., Disp: , Rfl:  .  pravastatin (PRAVACHOL) 20 MG tablet, Take 20 mg by mouth daily., Disp: , Rfl:  .  predniSONE (DELTASONE) 5 MG tablet, Take 5 mg by mouth daily with breakfast., Disp: , Rfl:  .  Probiotic Product (PROBIOTIC COMPLEX ACIDOPHILUS PO), Take by mouth., Disp: , Rfl:  .  quinapril (ACCUPRIL) 40 MG tablet, Take 40 mg by mouth 1 day or 1 dose., Disp: , Rfl:  .  tamsulosin (FLOMAX) 0.4 MG CAPS capsule, Take 0.4 mg  by mouth daily., Disp: , Rfl:  .  traMADol (ULTRAM-ER) 100 MG 24 hr tablet, Take 50 mg by mouth 2 (two) times daily. With lunch and dinner , Disp: , Rfl:  .  traMADol (ULTRAM-ER) 300 MG 24 hr tablet, Take 300 mg by mouth daily. AM dose, Disp: , Rfl:  No current facility-administered medications for this visit.   Facility-Administered Medications Ordered in Other Visits:  .  dextrose 5 % solution, , Intravenous, Once, Sindy Guadeloupe, MD .  fluorouracil (ADRUCIL) 4,000 mg in sodium chloride 0.9 % 70 mL chemo infusion, 2,000 mg/m2 (Treatment Plan Recorded), Intravenous, 1 day or 1 dose, Sindy Guadeloupe, MD .  leucovorin injection 40 mg, 20 mg/m2 (Treatment Plan Recorded), Intravenous, Once, Sindy Guadeloupe, MD .  trastuzumab (HERCEPTIN) 483 mg in sodium chloride 0.9 % 250 mL chemo infusion, 6 mg/kg (Treatment Plan Recorded), Intravenous, Once, Sindy Guadeloupe, MD  Physical exam:  Vitals:   04/28/18 0858  BP: (!) 181/69  Pulse: 67  Resp: 18  Temp: (!) 96.7 F (35.9 C)  TempSrc: Tympanic  Weight: 183 lb 6.4 oz (83.2 kg)  Height: _0  (1.803 m)   Physical Exam Constitutional:      General: He is not in acute distress. HENT:     Head: Normocephalic and atraumatic.  Eyes:     Pupils: Pupils are equal, round, and reactive to light.  Neck:     Musculoskeletal: Normal range of motion.  Cardiovascular:     Rate and Rhythm: Normal rate and regular rhythm.     Heart sounds: Normal heart sounds.  Pulmonary:     Effort: Pulmonary effort is normal.     Breath sounds: Normal breath sounds.  Abdominal:     General: Bowel sounds are normal.     Palpations: Abdomen is soft.  Skin:    General: Skin is warm and dry.  Neurological:     Mental Status: He is alert and oriented to person, place, and time.      CMP Latest Ref Rng & Units 04/28/2018  Glucose 70 - 99 mg/dL 156(H)  BUN 8 - 23 mg/dL 25(H)  Creatinine 0.61 - 1.24 mg/dL 1.27(H)  Sodium 135 - 145 mmol/L 141  Potassium 3.5 - 5.1 mmol/L  3.9  Chloride 98 - 111 mmol/L 107  CO2 22 - 32 mmol/L 26  Calcium 8.9 - 10.3 mg/dL 9.1  Total Protein 6.5 - 8.1 g/dL 7.0  Total Bilirubin 0.3 - 1.2 mg/dL 0.4  Alkaline Phos 38 - 126 U/L 56  AST  15 - 41 U/L 26  ALT 0 - 44 U/L 12   CBC Latest Ref Rng & Units 04/28/2018  WBC 4.0 - 10.5 K/uL 5.3  Hemoglobin 13.0 - 17.0 g/dL 9.0(L)  Hematocrit 39.0 - 52.0 % 28.8(L)  Platelets 150 - 400 K/uL 286    No images are attached to the encounter.  No results found.   Assessment and plan- Patient is a 85 y.o. male with stage IV esophageal adenocarcinomacTX N0M1with metastases to the para-aortic lymph nodes/p concurrent chemo/RT with carbo/taxol.Patient then received 2 cycles of palliative FOLFOX chemotherapy but his chemo was on hold since June 2019 due to chemo induced anemia requiring blood transfusions and interruption in the treatment and was restarted in November 29 when he had evidence of disease progression.  He is here for on treatment assessment prior to cycle 4 of infusional 5-FU chemotherapy along with Herceptin  Counts are okay to proceed with cycle 4 of infusional 5-FU at 2000 mg/m along with Herceptin today.  He will come back on day 3 for pump disconnect and receive Neulasta on that day I will see him back in 3 weeks time for cycle 5 along with CBC CMP and possible blood transfusion.  Repeat CBC and hold tube in 10 days for possible blood transfusion.   He will also be getting Herceptin today and plan is to repeat an echocardiogram after the next dose of Herceptin.  Plan for repeat scans in February 2020.  Patient has a baseline hemoglobin of 10 and given his age he probably has a component of MDS and his counts are very susceptible to chemotherapy and he does become significantly anemic requiring blood transfusions with every cycle of chemotherapy despite dose reductions    Visit Diagnosis 1. Malignant neoplasm of lower third of esophagus (HCC)   2. Symptomatic anemia       Dr. Randa Evens, MD, MPH [facility] at [facility] [phone] 04/28/2018 10:57 AM

## 2018-04-28 NOTE — Progress Notes (Signed)
Blood return noted before, during and after Leucovorin push.  

## 2018-04-28 NOTE — Progress Notes (Signed)
No cancer concerns today

## 2018-04-29 LAB — CEA: CEA1: 4.8 ng/mL — AB (ref 0.0–4.7)

## 2018-04-30 ENCOUNTER — Inpatient Hospital Stay: Payer: Medicare Other

## 2018-04-30 VITALS — BP 139/58 | HR 71 | Temp 98.2°F | Resp 20

## 2018-04-30 DIAGNOSIS — C159 Malignant neoplasm of esophagus, unspecified: Secondary | ICD-10-CM

## 2018-04-30 DIAGNOSIS — Z5112 Encounter for antineoplastic immunotherapy: Secondary | ICD-10-CM | POA: Diagnosis not present

## 2018-04-30 MED ORDER — HEPARIN SOD (PORK) LOCK FLUSH 100 UNIT/ML IV SOLN
500.0000 [IU] | Freq: Once | INTRAVENOUS | Status: AC | PRN
  Administered 2018-04-30: 500 [IU]
  Filled 2018-04-30: qty 5

## 2018-04-30 MED ORDER — PEGFILGRASTIM-CBQV 6 MG/0.6ML ~~LOC~~ SOSY
6.0000 mg | PREFILLED_SYRINGE | Freq: Once | SUBCUTANEOUS | Status: AC
  Administered 2018-04-30: 6 mg via SUBCUTANEOUS
  Filled 2018-04-30: qty 0.6

## 2018-05-08 ENCOUNTER — Inpatient Hospital Stay: Payer: Medicare Other

## 2018-05-08 DIAGNOSIS — C155 Malignant neoplasm of lower third of esophagus: Secondary | ICD-10-CM

## 2018-05-08 DIAGNOSIS — Z5112 Encounter for antineoplastic immunotherapy: Secondary | ICD-10-CM | POA: Diagnosis not present

## 2018-05-08 DIAGNOSIS — D649 Anemia, unspecified: Secondary | ICD-10-CM

## 2018-05-08 LAB — SAMPLE TO BLOOD BANK

## 2018-05-08 LAB — CBC WITH DIFFERENTIAL/PLATELET
Abs Immature Granulocytes: 0.35 10*3/uL — ABNORMAL HIGH (ref 0.00–0.07)
Basophils Absolute: 0.1 10*3/uL (ref 0.0–0.1)
Basophils Relative: 0 %
Eosinophils Absolute: 0.2 10*3/uL (ref 0.0–0.5)
Eosinophils Relative: 1 %
HCT: 27.9 % — ABNORMAL LOW (ref 39.0–52.0)
Hemoglobin: 8.7 g/dL — ABNORMAL LOW (ref 13.0–17.0)
Immature Granulocytes: 2 %
Lymphocytes Relative: 10 %
Lymphs Abs: 1.6 10*3/uL (ref 0.7–4.0)
MCH: 31.6 pg (ref 26.0–34.0)
MCHC: 31.2 g/dL (ref 30.0–36.0)
MCV: 101.5 fL — ABNORMAL HIGH (ref 80.0–100.0)
Monocytes Absolute: 1.4 10*3/uL — ABNORMAL HIGH (ref 0.1–1.0)
Monocytes Relative: 9 %
Neutro Abs: 12.7 10*3/uL — ABNORMAL HIGH (ref 1.7–7.7)
Neutrophils Relative %: 78 %
Platelets: 201 10*3/uL (ref 150–400)
RBC: 2.75 MIL/uL — ABNORMAL LOW (ref 4.22–5.81)
RDW: 16.3 % — ABNORMAL HIGH (ref 11.5–15.5)
WBC: 16.3 10*3/uL — ABNORMAL HIGH (ref 4.0–10.5)
nRBC: 0 % (ref 0.0–0.2)

## 2018-05-09 ENCOUNTER — Other Ambulatory Visit: Payer: Medicare Other

## 2018-05-09 ENCOUNTER — Telehealth: Payer: Self-pay | Admitting: *Deleted

## 2018-05-09 ENCOUNTER — Inpatient Hospital Stay: Payer: Medicare Other

## 2018-05-09 ENCOUNTER — Ambulatory Visit: Payer: Medicare Other

## 2018-05-09 NOTE — Telephone Encounter (Signed)
Called patient to let him know that his hemoglobin is 8.7 today and he does not need a blood transfusion.  Asked patient if the next time he has an appointment to have the blood work to determine about a blood transfusion the next day if he could just call me and I gave him my direct number but he already had it.  I asked him if he will just call the afternoon of the same day that he has his blood work done and that way I would let him know if he has to come the next day for the blood transfusion, he is agreeable to the plan.

## 2018-05-13 ENCOUNTER — Ambulatory Visit: Payer: Medicare Other

## 2018-05-13 ENCOUNTER — Other Ambulatory Visit: Payer: Medicare Other

## 2018-05-13 ENCOUNTER — Ambulatory Visit: Payer: Medicare Other | Admitting: Oncology

## 2018-05-19 ENCOUNTER — Other Ambulatory Visit: Payer: Self-pay

## 2018-05-19 ENCOUNTER — Other Ambulatory Visit: Payer: Self-pay | Admitting: *Deleted

## 2018-05-19 ENCOUNTER — Encounter: Payer: Self-pay | Admitting: Oncology

## 2018-05-19 ENCOUNTER — Inpatient Hospital Stay: Payer: Medicare Other

## 2018-05-19 ENCOUNTER — Inpatient Hospital Stay (HOSPITAL_BASED_OUTPATIENT_CLINIC_OR_DEPARTMENT_OTHER): Payer: Medicare Other | Admitting: Oncology

## 2018-05-19 VITALS — BP 158/92 | HR 58 | Temp 97.0°F | Resp 16 | Ht 71.0 in | Wt 184.2 lb

## 2018-05-19 DIAGNOSIS — Z79899 Other long term (current) drug therapy: Secondary | ICD-10-CM

## 2018-05-19 DIAGNOSIS — Z5112 Encounter for antineoplastic immunotherapy: Secondary | ICD-10-CM | POA: Diagnosis not present

## 2018-05-19 DIAGNOSIS — C155 Malignant neoplasm of lower third of esophagus: Secondary | ICD-10-CM

## 2018-05-19 DIAGNOSIS — C159 Malignant neoplasm of esophagus, unspecified: Secondary | ICD-10-CM

## 2018-05-19 DIAGNOSIS — D6481 Anemia due to antineoplastic chemotherapy: Secondary | ICD-10-CM

## 2018-05-19 DIAGNOSIS — E039 Hypothyroidism, unspecified: Secondary | ICD-10-CM | POA: Diagnosis not present

## 2018-05-19 DIAGNOSIS — D649 Anemia, unspecified: Secondary | ICD-10-CM

## 2018-05-19 DIAGNOSIS — Z5111 Encounter for antineoplastic chemotherapy: Secondary | ICD-10-CM

## 2018-05-19 DIAGNOSIS — T451X5A Adverse effect of antineoplastic and immunosuppressive drugs, initial encounter: Principal | ICD-10-CM

## 2018-05-19 LAB — CBC WITH DIFFERENTIAL/PLATELET
ABS IMMATURE GRANULOCYTES: 0.03 10*3/uL (ref 0.00–0.07)
Basophils Absolute: 0 10*3/uL (ref 0.0–0.1)
Basophils Relative: 1 %
Eosinophils Absolute: 0.2 10*3/uL (ref 0.0–0.5)
Eosinophils Relative: 3 %
HCT: 26.6 % — ABNORMAL LOW (ref 39.0–52.0)
Hemoglobin: 8.5 g/dL — ABNORMAL LOW (ref 13.0–17.0)
Immature Granulocytes: 1 %
Lymphocytes Relative: 17 %
Lymphs Abs: 1.1 10*3/uL (ref 0.7–4.0)
MCH: 31.7 pg (ref 26.0–34.0)
MCHC: 32 g/dL (ref 30.0–36.0)
MCV: 99.3 fL (ref 80.0–100.0)
Monocytes Absolute: 0.7 10*3/uL (ref 0.1–1.0)
Monocytes Relative: 11 %
NEUTROS ABS: 4.5 10*3/uL (ref 1.7–7.7)
Neutrophils Relative %: 67 %
Platelets: 220 10*3/uL (ref 150–400)
RBC: 2.68 MIL/uL — ABNORMAL LOW (ref 4.22–5.81)
RDW: 15.9 % — ABNORMAL HIGH (ref 11.5–15.5)
WBC: 6.6 10*3/uL (ref 4.0–10.5)
nRBC: 0 % (ref 0.0–0.2)

## 2018-05-19 LAB — COMPREHENSIVE METABOLIC PANEL
ALT: 13 U/L (ref 0–44)
AST: 25 U/L (ref 15–41)
Albumin: 4 g/dL (ref 3.5–5.0)
Alkaline Phosphatase: 60 U/L (ref 38–126)
Anion gap: 9 (ref 5–15)
BUN: 31 mg/dL — AB (ref 8–23)
CO2: 24 mmol/L (ref 22–32)
Calcium: 9.1 mg/dL (ref 8.9–10.3)
Chloride: 106 mmol/L (ref 98–111)
Creatinine, Ser: 1.13 mg/dL (ref 0.61–1.24)
GFR calc Af Amer: >60 mL/min (ref >60)
GFR calc non Af Amer: >60 mL/min (ref >60)
Glucose, Bld: 110 mg/dL — ABNORMAL HIGH (ref 70–99)
POTASSIUM: 3.9 mmol/L (ref 3.5–5.1)
Sodium: 139 mmol/L (ref 135–145)
Total Bilirubin: 0.4 mg/dL (ref 0.3–1.2)
Total Protein: 7.2 g/dL (ref 6.5–8.1)

## 2018-05-19 MED ORDER — SODIUM CHLORIDE 0.9% FLUSH
10.0000 mL | INTRAVENOUS | Status: DC | PRN
  Administered 2018-05-19: 10 mL
  Filled 2018-05-19: qty 10

## 2018-05-19 MED ORDER — DIPHENHYDRAMINE HCL 25 MG PO CAPS
50.0000 mg | ORAL_CAPSULE | Freq: Once | ORAL | Status: AC
  Administered 2018-05-19: 50 mg via ORAL
  Filled 2018-05-19: qty 2

## 2018-05-19 MED ORDER — SODIUM CHLORIDE 0.9 % IV SOLN
2000.0000 mg/m2 | INTRAVENOUS | Status: DC
  Administered 2018-05-19: 4000 mg via INTRAVENOUS
  Filled 2018-05-19: qty 80

## 2018-05-19 MED ORDER — DEXTROSE 5 % IV SOLN
Freq: Once | INTRAVENOUS | Status: DC
  Filled 2018-05-19: qty 250

## 2018-05-19 MED ORDER — DEXAMETHASONE SODIUM PHOSPHATE 10 MG/ML IJ SOLN
10.0000 mg | Freq: Once | INTRAMUSCULAR | Status: AC
  Administered 2018-05-19: 10 mg via INTRAVENOUS
  Filled 2018-05-19: qty 1

## 2018-05-19 MED ORDER — ACETAMINOPHEN 325 MG PO TABS
650.0000 mg | ORAL_TABLET | Freq: Once | ORAL | Status: AC
  Administered 2018-05-19: 650 mg via ORAL
  Filled 2018-05-19: qty 2

## 2018-05-19 MED ORDER — TRASTUZUMAB CHEMO 150 MG IV SOLR
6.0000 mg/kg | Freq: Once | INTRAVENOUS | Status: AC
  Administered 2018-05-19: 483 mg via INTRAVENOUS
  Filled 2018-05-19: qty 23

## 2018-05-19 MED ORDER — PALONOSETRON HCL INJECTION 0.25 MG/5ML
0.2500 mg | Freq: Once | INTRAVENOUS | Status: AC
  Administered 2018-05-19: 0.25 mg via INTRAVENOUS
  Filled 2018-05-19: qty 5

## 2018-05-19 MED ORDER — SODIUM CHLORIDE 0.9 % IV SOLN
Freq: Once | INTRAVENOUS | Status: AC
  Administered 2018-05-19: 09:00:00 via INTRAVENOUS
  Filled 2018-05-19: qty 250

## 2018-05-19 MED ORDER — LEUCOVORIN CALCIUM INJECTION 100 MG
20.0000 mg/m2 | Freq: Once | INTRAMUSCULAR | Status: AC
  Administered 2018-05-19: 40 mg via INTRAVENOUS
  Filled 2018-05-19: qty 2

## 2018-05-19 NOTE — Progress Notes (Signed)
Patient here for follow up. He denies symptoms. He did have a fall last week but did not sustain injury.

## 2018-05-20 NOTE — Progress Notes (Signed)
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 Mass was nonobstructing, partially obstructing and not circumferential. 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 Angiolymphatic invasion was present. Fundic gland polyp was noted in the stomach which was negative for H. pylori dysplasia and malignancy.  Patient underwent PET/CT scan on 05/28/2017 which showed small hypermetabolic mass in the distal esophagus with an SUV of 10.7. Hypermetabolic left periaortic adenopathy compatible with malignancy. Larger of the 2 involved lymph nodes with a maximum SUV of 16.1. No other regions of tumor are identified.  Tumor marker testing shows he is positive for HER-2 and PDL 1 expressed  Biopsy of the left para-aortic lymph node positive for metastatic esophageal cancer  Patient completed concurrent chemo/RT on 07/24/17. He did miss his last dose of chemotherapy due to neutropenia  Repeat PET/Ct scan showed:IMPRESSION: 1. Today's study demonstrates a positive response to therapy with regression of the primary lesion in the distal esophagus and decreased hypermetabolism in retroperitoneal lymph nodes which are essentially unchanged in  size. 2. There is a new focus of hypermetabolism in the proximal stomach just distal to the gastroesophageal junction. This is of uncertain etiology and significance, and could be physiologic, but correlation with endoscopy may be considered.  MUGA scan showed normal EF  Chemotherapy has been on hold since 10/15/17 due to persistent anemia requiring blood transfusions. He only received 2 cycles.Scans in November 2019 showed progression of adenopathy in the retroperitoneal and gastrohepatic region. Infusional 5-FU and Herceptin was restarted omitting bolus 5-FU and oxaliplatin.  He has still been requiring intermittent blood transfusions due to his baseline anemia with a hemoglobin of 10 and a probable component of MDS given his age  Interval history-he is doing well with chemo and other than mild fatigue does not report any significant complaints.  His appetite is good denies any nausea vomiting or pain  ECOG PS- 1 Pain scale- 0 Opioid associated constipation- no  Review of systems- Review of Systems  Constitutional: Positive for malaise/fatigue. Negative for chills, fever and weight loss.  HENT: Negative for congestion, ear discharge and nosebleeds.   Eyes: Negative for blurred vision.  Respiratory: Negative for cough, hemoptysis, sputum production, shortness of breath and wheezing.   Cardiovascular: Negative for chest pain, palpitations, orthopnea and claudication.  Gastrointestinal: Negative for abdominal pain, blood in stool, constipation, diarrhea, heartburn, melena, nausea and vomiting.  Genitourinary: Negative for dysuria, flank pain, frequency, hematuria and urgency.  Musculoskeletal: Negative for back pain, joint pain and myalgias.  Skin: Negative for rash.  Neurological: Negative for dizziness, tingling, focal weakness, seizures, weakness and headaches.  Endo/Heme/Allergies: Does not bruise/bleed easily.  Psychiatric/Behavioral: Negative for depression and suicidal ideas.  The patient does not have insomnia.       Allergies  Allergen Reactions  . Morphine And Related Nausea Only  Past Medical History:  Diagnosis Date  . Anemia   . Arthritis   . Coronary artery disease   . Diverticulitis   . Dysrhythmia   . Esophageal cancer ([place]) 05/2017   Chemo + rad tx's.   Marland Kitchen GERD (gastroesophageal reflux disease)   . Heart murmur   . History of hiatal hernia   . Hyperglycemia   . Hyperlipidemia   . Hypertension   . Hypothyroidism   . Spinal stenosis      Past Surgical History:  Procedure Laterality Date  . BACK SURGERY    . CATARACT EXTRACTION W/PHACO Left 11/08/2014   Procedure: CATARACT EXTRACTION PHACO AND INTRAOCULAR LENS PLACEMENT (IOC);  Surgeon: Estill Cotta, MD;  Location: [facility] ORS;  Service: Ophthalmology;  Laterality: Left;  US:01:20 AP:21.8 CDE:31.09 PAC LOT: [id number] H  . CHOLECYSTECTOMY    . ESOPHAGOGASTRODUODENOSCOPY (EGD) WITH PROPOFOL N/A 05/23/2017   Procedure: ESOPHAGOGASTRODUODENOSCOPY (EGD) WITH PROPOFOL;  Surgeon: Lollie Sails, MD;  Location: [facility] ENDOSCOPY;  Service: Endoscopy;  Laterality: N/A;  . EYE SURGERY    . IR FLUORO GUIDE PORT INSERTION RIGHT  06/07/2017  . JOINT REPLACEMENT Right    Knee  . ROTATOR CUFF REPAIR Right     Social History   Socioeconomic History  . Marital status: Married    Spouse name: Not on file  . Number of children: Not on file  . Years of education: Not on file  . Highest education level: Not on file  Occupational History  . Not on file  Social Needs  . Financial resource strain: Not on file  . Food insecurity:    Worry: Not on file    Inability: Not on file  . Transportation needs:    Medical: Not on file    Non-medical: Not on file  Tobacco Use  . Smoking status: Former Smoker    Last attempt to quit: 03/24/1989    Years since quitting: 29.1  . Smokeless tobacco: Never Used  Substance and Sexual Activity  . Alcohol use: No    Frequency: Never  . Drug use: No  .  Sexual activity: Not Currently  Lifestyle  . Physical activity:    Days per week: Not on file    Minutes per session: Not on file  . Stress: Not on file  Relationships  . Social connections:    Talks on phone: Not on file    Gets together: Not on file    Attends religious service: Not on file    Active member of club or organization: Not on file    Attends meetings of clubs or organizations: Not on file    Relationship status: Not on file  . Intimate partner violence:    Fear of current or ex partner: Not on file    Emotionally abused: Not on file    Physically abused: Not on file    Forced sexual activity: Not on file  Other Topics Concern  . Not on file  Social History Narrative  . Not on file    Family History  Problem Relation Age of Onset  . Colon cancer Mother      Current Outpatient Medications:  .  acetaminophen (TYLENOL) 650 MG CR tablet, Take 650 mg by mouth every 8 (eight) hours as needed for pain., Disp: , Rfl:  .  ascorbic acid (VITAMIN C) 1000 MG tablet, Take 1,000 mg by mouth daily., Disp: , Rfl:  .  docusate sodium (COLACE) 100 MG capsule, Take 100 mg by mouth  daily., Disp: , Rfl:  .  levothyroxine (SYNTHROID, LEVOTHROID) 25 MCG tablet, Take 25 mcg by mouth daily before breakfast., Disp: , Rfl:  .  metoprolol tartrate (LOPRESSOR) 50 MG tablet, Take 50 mg by mouth 2 (two) times daily., Disp: , Rfl:  .  omeprazole (PRILOSEC) 20 MG capsule, Take 20 mg by mouth daily., Disp: , Rfl:  .  pravastatin (PRAVACHOL) 20 MG tablet, Take 20 mg by mouth daily., Disp: , Rfl:  .  predniSONE (DELTASONE) 5 MG tablet, Take 5 mg by mouth daily with breakfast., Disp: , Rfl:  .  Probiotic Product (PROBIOTIC COMPLEX ACIDOPHILUS PO), Take by mouth., Disp: , Rfl:  .  quinapril (ACCUPRIL) 40 MG tablet, Take 40 mg by mouth 1 day or 1 dose., Disp: , Rfl:  .  tamsulosin (FLOMAX) 0.4 MG CAPS capsule, Take 0.4 mg by mouth daily., Disp: , Rfl:  .  traMADol (ULTRAM-ER) 100 MG 24 hr tablet,  Take 50 mg by mouth 2 (two) times daily. With lunch and dinner , Disp: , Rfl:  .  traMADol (ULTRAM-ER) 300 MG 24 hr tablet, Take 300 mg by mouth daily. AM dose, Disp: , Rfl:  No current facility-administered medications for this visit.   Facility-Administered Medications Ordered in Other Visits:  .  dextrose 5 % solution, , Intravenous, Once, Sindy Guadeloupe, MD  Physical exam:  Vitals:   05/19/18 0833 05/19/18 0840  BP:  (!) 158/92  Pulse:  (!) 58  Resp: 16   Temp:  (!) 97 F (36.1 C)  TempSrc:  Tympanic  Weight: 184 lb 3.2 oz (83.6 kg)   Height: _0  (1.803 m)    Physical Exam HENT:     Head: Normocephalic and atraumatic.  Eyes:     Pupils: Pupils are equal, round, and reactive to light.  Neck:     Musculoskeletal: Normal range of motion.  Cardiovascular:     Rate and Rhythm: Normal rate and regular rhythm.     Heart sounds: Murmur present.  Pulmonary:     Effort: Pulmonary effort is normal.     Breath sounds: Normal breath sounds.  Abdominal:     General: Bowel sounds are normal.     Palpations: Abdomen is soft.  Skin:    General: Skin is warm and dry.  Neurological:     Mental Status: He is alert and oriented to person, place, and time.      CMP Latest Ref Rng & Units 05/19/2018  Glucose 70 - 99 mg/dL 110(H)  BUN 8 - 23 mg/dL 31(H)  Creatinine 0.61 - 1.24 mg/dL 1.13  Sodium 135 - 145 mmol/L 139  Potassium 3.5 - 5.1 mmol/L 3.9  Chloride 98 - 111 mmol/L 106  CO2 22 - 32 mmol/L 24  Calcium 8.9 - 10.3 mg/dL 9.1  Total Protein 6.5 - 8.1 g/dL 7.2  Total Bilirubin 0.3 - 1.2 mg/dL 0.4  Alkaline Phos 38 - 126 U/L 60  AST 15 - 41 U/L 25  ALT 0 - 44 U/L 13   CBC Latest Ref Rng & Units 05/19/2018  WBC 4.0 - 10.5 K/uL 6.6  Hemoglobin 13.0 - 17.0 g/dL 8.5(L)  Hematocrit 39.0 - 52.0 % 26.6(L)  Platelets 150 - 400 K/uL 220    Assessment and plan- Patient is a 85 y.o. male with stage IV esophageal adenocarcinomacTX N0M1with metastases to the para-aortic lymph  nodes/p concurrent chemo/RT with carbo/taxol.Patient then received 2 cycles of palliative FOLFOX chemotherapy but his chemo wason hold since June  2019 due to chemo induced anemia requiring blood transfusions and interruption in the treatmentand was restarted in November 29 when he had evidence of disease progression.    He is here for on treatment assessment prior to cycle 5 of infusional 5-FU chemotherapy along with Herceptin  Counts okay to proceed with infusional 5-FU chemotherapy cycle 5 today along with Herceptin today.  He will come for pump disconnect on day 3 and will receive udencya on that day.  CBC hold tube for possible transfusion in 10 days  I will see him back in 3 weeks time with CBC CMP and CEA prior to cycle #6 of infusional 5-FU and Herceptin  Repeat echocardiogram prior to next cycle  Patient has baseline moderate anemia with a hemoglobin of 10 probably secondary to MDS and he does tend to get quite anemic with chemotherapy requiring transfusions approximately once a month.  I am holding off on Procrit at this time   Visit Diagnosis 1. Malignant neoplasm of esophagus, unspecified location ([place])   2. High risk medication use   3. Encounter for antineoplastic chemotherapy   4. Encounter for monoclonal antibody treatment for malignancy      Dr. Randa Evens, MD, MPH [facility] at [facility] [phone] 05/20/2018 11:19 AM

## 2018-05-21 ENCOUNTER — Inpatient Hospital Stay: Payer: Medicare Other

## 2018-05-21 VITALS — BP 138/84 | HR 90 | Resp 18

## 2018-05-21 DIAGNOSIS — C159 Malignant neoplasm of esophagus, unspecified: Secondary | ICD-10-CM

## 2018-05-21 DIAGNOSIS — Z5112 Encounter for antineoplastic immunotherapy: Secondary | ICD-10-CM | POA: Diagnosis not present

## 2018-05-21 MED ORDER — HEPARIN SOD (PORK) LOCK FLUSH 100 UNIT/ML IV SOLN
500.0000 [IU] | Freq: Once | INTRAVENOUS | Status: AC | PRN
  Administered 2018-05-21: 500 [IU]

## 2018-05-21 MED ORDER — PEGFILGRASTIM-CBQV 6 MG/0.6ML ~~LOC~~ SOSY
6.0000 mg | PREFILLED_SYRINGE | Freq: Once | SUBCUTANEOUS | Status: AC
  Administered 2018-05-21: 6 mg via SUBCUTANEOUS
  Filled 2018-05-21: qty 0.6

## 2018-05-29 ENCOUNTER — Inpatient Hospital Stay: Payer: Medicare Other | Attending: Oncology

## 2018-05-29 DIAGNOSIS — Z5189 Encounter for other specified aftercare: Secondary | ICD-10-CM | POA: Insufficient documentation

## 2018-05-29 DIAGNOSIS — Z452 Encounter for adjustment and management of vascular access device: Secondary | ICD-10-CM | POA: Insufficient documentation

## 2018-05-29 DIAGNOSIS — D6481 Anemia due to antineoplastic chemotherapy: Secondary | ICD-10-CM | POA: Diagnosis not present

## 2018-05-29 DIAGNOSIS — Z5111 Encounter for antineoplastic chemotherapy: Secondary | ICD-10-CM | POA: Diagnosis present

## 2018-05-29 DIAGNOSIS — C155 Malignant neoplasm of lower third of esophagus: Secondary | ICD-10-CM | POA: Diagnosis present

## 2018-05-29 DIAGNOSIS — T451X5A Adverse effect of antineoplastic and immunosuppressive drugs, initial encounter: Secondary | ICD-10-CM

## 2018-05-29 DIAGNOSIS — Z5112 Encounter for antineoplastic immunotherapy: Secondary | ICD-10-CM | POA: Insufficient documentation

## 2018-05-29 LAB — CBC WITH DIFFERENTIAL/PLATELET
Abs Immature Granulocytes: 0.47 10*3/uL — ABNORMAL HIGH (ref 0.00–0.07)
Basophils Absolute: 0.1 10*3/uL (ref 0.0–0.1)
Basophils Relative: 0 %
EOS PCT: 3 %
Eosinophils Absolute: 0.4 10*3/uL (ref 0.0–0.5)
HCT: 27.6 % — ABNORMAL LOW (ref 39.0–52.0)
Hemoglobin: 8.5 g/dL — ABNORMAL LOW (ref 13.0–17.0)
Immature Granulocytes: 3 %
Lymphocytes Relative: 9 %
Lymphs Abs: 1.5 10*3/uL (ref 0.7–4.0)
MCH: 31.6 pg (ref 26.0–34.0)
MCHC: 30.8 g/dL (ref 30.0–36.0)
MCV: 102.6 fL — ABNORMAL HIGH (ref 80.0–100.0)
Monocytes Absolute: 1.5 10*3/uL — ABNORMAL HIGH (ref 0.1–1.0)
Monocytes Relative: 10 %
Neutro Abs: 11.8 10*3/uL — ABNORMAL HIGH (ref 1.7–7.7)
Neutrophils Relative %: 75 %
Platelets: 209 10*3/uL (ref 150–400)
RBC: 2.69 MIL/uL — ABNORMAL LOW (ref 4.22–5.81)
RDW: 17.2 % — ABNORMAL HIGH (ref 11.5–15.5)
WBC: 15.7 10*3/uL — ABNORMAL HIGH (ref 4.0–10.5)
nRBC: 0 % (ref 0.0–0.2)

## 2018-05-29 LAB — SAMPLE TO BLOOD BANK

## 2018-05-30 ENCOUNTER — Inpatient Hospital Stay: Payer: Medicare Other

## 2018-06-03 ENCOUNTER — Ambulatory Visit
Admission: RE | Admit: 2018-06-03 | Discharge: 2018-06-03 | Disposition: A | Discharge status: Home / Self Care | Payer: Medicare Other | Source: Ambulatory Visit | Attending: Oncology | Admitting: Oncology

## 2018-06-03 DIAGNOSIS — C159 Malignant neoplasm of esophagus, unspecified: Secondary | ICD-10-CM | POA: Insufficient documentation

## 2018-06-03 DIAGNOSIS — I080 Rheumatic disorders of both mitral and aortic valves: Secondary | ICD-10-CM | POA: Diagnosis not present

## 2018-06-03 DIAGNOSIS — Z79899 Other long term (current) drug therapy: Secondary | ICD-10-CM | POA: Diagnosis not present

## 2018-06-03 NOTE — Progress Notes (Signed)
*  PRELIMINARY RESULTS* Echocardiogram 2D Echocardiogram has been performed.  Eddie Castaneda 06/03/2018, 11:42 AM

## 2018-06-09 ENCOUNTER — Inpatient Hospital Stay (HOSPITAL_BASED_OUTPATIENT_CLINIC_OR_DEPARTMENT_OTHER): Payer: Medicare Other | Admitting: Oncology

## 2018-06-09 ENCOUNTER — Encounter: Payer: Self-pay | Admitting: Oncology

## 2018-06-09 ENCOUNTER — Other Ambulatory Visit: Payer: Self-pay

## 2018-06-09 ENCOUNTER — Inpatient Hospital Stay: Payer: Medicare Other

## 2018-06-09 VITALS — BP 156/78 | HR 64 | Temp 96.0°F | Resp 18 | Wt 183.8 lb

## 2018-06-09 DIAGNOSIS — C159 Malignant neoplasm of esophagus, unspecified: Secondary | ICD-10-CM

## 2018-06-09 DIAGNOSIS — D6481 Anemia due to antineoplastic chemotherapy: Secondary | ICD-10-CM

## 2018-06-09 DIAGNOSIS — T451X5A Adverse effect of antineoplastic and immunosuppressive drugs, initial encounter: Secondary | ICD-10-CM

## 2018-06-09 DIAGNOSIS — C155 Malignant neoplasm of lower third of esophagus: Secondary | ICD-10-CM

## 2018-06-09 DIAGNOSIS — Z5112 Encounter for antineoplastic immunotherapy: Secondary | ICD-10-CM | POA: Diagnosis not present

## 2018-06-09 DIAGNOSIS — Z5111 Encounter for antineoplastic chemotherapy: Secondary | ICD-10-CM

## 2018-06-09 LAB — COMPREHENSIVE METABOLIC PANEL
ALT: 13 U/L (ref 0–44)
AST: 25 U/L (ref 15–41)
Albumin: 3.8 g/dL (ref 3.5–5.0)
Alkaline Phosphatase: 58 U/L (ref 38–126)
Anion gap: 9 (ref 5–15)
BUN: 25 mg/dL — ABNORMAL HIGH (ref 8–23)
CHLORIDE: 105 mmol/L (ref 98–111)
CO2: 24 mmol/L (ref 22–32)
CREATININE: 1.14 mg/dL (ref 0.61–1.24)
Calcium: 8.9 mg/dL (ref 8.9–10.3)
GFR calc Af Amer: >60 mL/min (ref >60)
GFR, EST NON AFRICAN AMERICAN: 60 mL/min — AB (ref >60)
Glucose, Bld: 143 mg/dL — ABNORMAL HIGH (ref 70–99)
Potassium: 4.1 mmol/L (ref 3.5–5.1)
Sodium: 138 mmol/L (ref 135–145)
Total Bilirubin: 0.3 mg/dL (ref 0.3–1.2)
Total Protein: 6.7 g/dL (ref 6.5–8.1)

## 2018-06-09 LAB — CBC WITH DIFFERENTIAL/PLATELET
Abs Immature Granulocytes: 0.03 10*3/uL (ref 0.00–0.07)
Basophils Absolute: 0 10*3/uL (ref 0.0–0.1)
Basophils Relative: 1 %
Eosinophils Absolute: 0.2 10*3/uL (ref 0.0–0.5)
Eosinophils Relative: 3 %
HCT: 26 % — ABNORMAL LOW (ref 39.0–52.0)
HEMOGLOBIN: 8.3 g/dL — AB (ref 13.0–17.0)
Immature Granulocytes: 1 %
Lymphocytes Relative: 21 %
Lymphs Abs: 1.2 10*3/uL (ref 0.7–4.0)
MCH: 32.4 pg (ref 26.0–34.0)
MCHC: 31.9 g/dL (ref 30.0–36.0)
MCV: 101.6 fL — ABNORMAL HIGH (ref 80.0–100.0)
Monocytes Absolute: 0.6 10*3/uL (ref 0.1–1.0)
Monocytes Relative: 10 %
Neutro Abs: 3.7 10*3/uL (ref 1.7–7.7)
Neutrophils Relative %: 64 %
Platelets: 267 10*3/uL (ref 150–400)
RBC: 2.56 MIL/uL — ABNORMAL LOW (ref 4.22–5.81)
RDW: 16.5 % — ABNORMAL HIGH (ref 11.5–15.5)
WBC: 5.7 10*3/uL (ref 4.0–10.5)
nRBC: 0 % (ref 0.0–0.2)

## 2018-06-09 MED ORDER — ACETAMINOPHEN 325 MG PO TABS
650.0000 mg | ORAL_TABLET | Freq: Once | ORAL | Status: AC
  Administered 2018-06-09: 650 mg via ORAL
  Filled 2018-06-09: qty 2

## 2018-06-09 MED ORDER — HEPARIN SOD (PORK) LOCK FLUSH 100 UNIT/ML IV SOLN: 500.0000 [IU] | Freq: Once | INTRAVENOUS | Status: DC

## 2018-06-09 MED ORDER — LEUCOVORIN CALCIUM INJECTION 100 MG
20.0000 mg/m2 | Freq: Once | INTRAMUSCULAR | Status: AC
  Administered 2018-06-09: 40 mg via INTRAVENOUS
  Filled 2018-06-09: qty 2

## 2018-06-09 MED ORDER — SODIUM CHLORIDE 0.9 % IV SOLN
2000.0000 mg/m2 | INTRAVENOUS | Status: DC
  Administered 2018-06-09: 4000 mg via INTRAVENOUS
  Filled 2018-06-09: qty 80

## 2018-06-09 MED ORDER — DEXTROSE 5 % IV SOLN
Freq: Once | INTRAVENOUS | Status: DC
  Filled 2018-06-09: qty 250

## 2018-06-09 MED ORDER — SODIUM CHLORIDE 0.9 % IV SOLN
Freq: Once | INTRAVENOUS | Status: AC
  Administered 2018-06-09: 10:00:00 via INTRAVENOUS
  Filled 2018-06-09: qty 250

## 2018-06-09 MED ORDER — PALONOSETRON HCL INJECTION 0.25 MG/5ML
0.2500 mg | Freq: Once | INTRAVENOUS | Status: AC
  Administered 2018-06-09: 0.25 mg via INTRAVENOUS
  Filled 2018-06-09: qty 5

## 2018-06-09 MED ORDER — DEXAMETHASONE SODIUM PHOSPHATE 10 MG/ML IJ SOLN
10.0000 mg | Freq: Once | INTRAMUSCULAR | Status: AC
  Administered 2018-06-09: 10 mg via INTRAVENOUS
  Filled 2018-06-09: qty 1

## 2018-06-09 MED ORDER — SODIUM CHLORIDE 0.9% FLUSH
10.0000 mL | Freq: Once | INTRAVENOUS | Status: AC
  Administered 2018-06-09: 10 mL via INTRAVENOUS
  Filled 2018-06-09: qty 10

## 2018-06-09 MED ORDER — DIPHENHYDRAMINE HCL 25 MG PO CAPS
50.0000 mg | ORAL_CAPSULE | Freq: Once | ORAL | Status: AC
  Administered 2018-06-09: 50 mg via ORAL
  Filled 2018-06-09: qty 2

## 2018-06-09 MED ORDER — TRASTUZUMAB CHEMO 150 MG IV SOLR
6.0000 mg/kg | Freq: Once | INTRAVENOUS | Status: AC
  Administered 2018-06-09: 483 mg via INTRAVENOUS
  Filled 2018-06-09: qty 23

## 2018-06-09 NOTE — Progress Notes (Signed)
Here for follow up. Over all per pt-" doing ok" tired most of the time per pt.

## 2018-06-09 NOTE — Progress Notes (Signed)
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 Mass was nonobstructing, partially obstructing and not circumferential. 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 Angiolymphatic invasion was present. Fundic gland polyp was noted in the stomach which was negative for H. pylori dysplasia and malignancy.  Patient underwent PET/CT scan on 05/28/2017 which showed small hypermetabolic mass in the distal esophagus with an SUV of 10.7. Hypermetabolic left periaortic adenopathy compatible with malignancy. Larger of the 2 involved lymph nodes with a maximum SUV of 16.1. No other regions of tumor are identified.  Tumor marker testing shows he is positive for HER-2 and PDL 1 expressed  Biopsy of the left para-aortic lymph node positive for metastatic esophageal cancer  Patient completed concurrent chemo/RT on 07/24/17. He did miss his last dose of chemotherapy due to neutropenia  Repeat PET/Ct scan showed:IMPRESSION: 1. Today's study demonstrates a positive response to therapy with regression of the primary lesion in the distal esophagus and decreased hypermetabolism in retroperitoneal lymph nodes which are essentially unchanged in  size. 2. There is a new focus of hypermetabolism in the proximal stomach just distal to the gastroesophageal junction. This is of uncertain etiology and significance, and could be physiologic, but correlation with endoscopy may be considered.  MUGA scan showed normal EF  Chemotherapy has been on hold since 10/15/17 due to persistent anemia requiring blood transfusions. He only received 2 cycles.Scans in November 2019 showed progression of adenopathy in the retroperitoneal and gastrohepatic region. Infusional 5-FU and Herceptin was restarted omitting bolus 5-FU and oxaliplatin.He has still been requiring intermittent blood transfusions due to his baseline anemia with a hemoglobin of 10 and a probable component of MDS given his age  Interval history-he is doing well and denies any fatigue nausea or vomiting.  His appetite is good and he denies any unintentional weight loss.  He is still maintaining a good quality of life  ECOG PS- 1 Pain scale- 0   Review of systems- Review of Systems  Constitutional: Negative for chills, fever, malaise/fatigue and weight loss.  HENT: Negative for congestion, ear discharge and nosebleeds.   Eyes: Negative for blurred vision.  Respiratory: Negative for cough, hemoptysis, sputum production, shortness of breath and wheezing.   Cardiovascular: Negative for chest pain, palpitations, orthopnea and claudication.  Gastrointestinal: Negative for abdominal pain, blood in stool, constipation, diarrhea, heartburn, melena, nausea and vomiting.  Genitourinary: Negative for dysuria, flank pain, frequency, hematuria and urgency.  Musculoskeletal: Negative for back pain, joint pain and myalgias.  Skin: Negative for rash.  Neurological: Negative for dizziness, tingling, focal weakness, seizures, weakness and headaches.  Endo/Heme/Allergies: Does not bruise/bleed easily.  Psychiatric/Behavioral: Negative for depression and suicidal ideas. The patient does not have  insomnia.       Allergies  Allergen Reactions  . Morphine And Related Nausea Only     Past Medical History:  Diagnosis Date  . Anemia   . Arthritis   . Coronary artery disease   . Diverticulitis   . Dysrhythmia   . Esophageal cancer ([place]) 05/2017   Chemo + rad tx's.   Marland Kitchen GERD (gastroesophageal reflux disease)   . Heart murmur   . History of hiatal hernia   . Hyperglycemia   . Hyperlipidemia   . Hypertension   . Hypothyroidism   . Spinal stenosis      Past Surgical History:  Procedure Laterality Date  . BACK SURGERY    . CATARACT EXTRACTION W/PHACO Left 11/08/2014   Procedure: CATARACT EXTRACTION PHACO AND INTRAOCULAR LENS PLACEMENT (IOC);  Surgeon: Estill Cotta, MD;  Location: [facility] ORS;  Service: Ophthalmology;  Laterality: Left;  US:01:20 AP:21.8 CDE:31.09 PAC LOT: [id number]  . CHOLECYSTECTOMY    . ESOPHAGOGASTRODUODENOSCOPY (EGD) WITH PROPOFOL N/A 05/23/2017   Procedure: ESOPHAGOGASTRODUODENOSCOPY (EGD) WITH PROPOFOL;  Surgeon: Lollie Sails, MD;  Location: [facility] ENDOSCOPY;  Service: Endoscopy;  Laterality: N/A;  . EYE SURGERY    . IR FLUORO GUIDE PORT INSERTION RIGHT  06/07/2017  . JOINT REPLACEMENT Right    Knee  . ROTATOR CUFF REPAIR Right     Social History   Socioeconomic History  . Marital status: Married    Spouse name: Not on file  . Number of children: Not on file  . Years of education: Not on file  . Highest education level: Not on file  Occupational History  . Not on file  Social Needs  . Financial resource strain: Not on file  . Food insecurity:    Worry: Not on file    Inability: Not on file  . Transportation needs:    Medical: Not on file    Non-medical: Not on file  Tobacco Use  . Smoking status: Former Smoker    Last attempt to quit: 03/24/1989    Years since quitting: 29.2  . Smokeless tobacco: Never Used  Substance and Sexual Activity  . Alcohol use: No    Frequency: Never  . Drug use: No  . Sexual activity: Not  Currently  Lifestyle  . Physical activity:    Days per week: Not on file    Minutes per session: Not on file  . Stress: Not on file  Relationships  . Social connections:    Talks on phone: Not on file    Gets together: Not on file    Attends religious service: Not on file    Active member of club or organization: Not on file    Attends meetings of clubs or organizations: Not on file    Relationship status: Not on file  . Intimate partner violence:    Fear of current or ex partner: Not on file    Emotionally abused: Not on file    Physically abused: Not on file    Forced sexual activity: Not on file  Other Topics Concern  . Not on file  Social History Narrative  . Not on file    Family History  Problem Relation Age of Onset  . Colon cancer Mother      Current Outpatient Medications:  .  acetaminophen (TYLENOL) 650 MG CR tablet, Take 650 mg by mouth every 8 (eight) hours as needed for pain., Disp: , Rfl:  .  ascorbic acid (VITAMIN C) 1000 MG tablet, Take 1,000 mg by mouth daily., Disp: , Rfl:  .  docusate sodium (COLACE) 100 MG capsule, Take 100 mg by mouth daily., Disp: , Rfl:  .  levothyroxine (SYNTHROID, LEVOTHROID) 25 MCG tablet, Take 25 mcg by mouth daily before breakfast., Disp: , Rfl:  .  metoprolol tartrate (LOPRESSOR) 50 MG tablet, Take 50 mg by mouth 2 (two) times daily., Disp: , Rfl:  .  omeprazole (PRILOSEC) 20 MG capsule, Take 20 mg by mouth daily., Disp: , Rfl:  .  pravastatin (PRAVACHOL) 20 MG tablet, Take 20 mg by mouth daily., Disp: , Rfl:  .  predniSONE (DELTASONE) 5 MG tablet, Take 5 mg by mouth daily with breakfast., Disp: , Rfl:  .  Probiotic Product (PROBIOTIC COMPLEX ACIDOPHILUS PO), Take by mouth., Disp: , Rfl:  .  quinapril (ACCUPRIL) 40 MG tablet, Take 40 mg by mouth 1 day or 1 dose., Disp: , Rfl:  .  tamsulosin (FLOMAX) 0.4 MG CAPS capsule, Take 0.4 mg by mouth daily., Disp: , Rfl:  .  traMADol (ULTRAM-ER) 100 MG 24 hr tablet, Take 50 mg by mouth 2  (two) times daily. With lunch and dinner , Disp: , Rfl:  .  traMADol (ULTRAM-ER) 300 MG 24 hr tablet, Take 300 mg by mouth daily. AM dose, Disp: , Rfl:  No current facility-administered medications for this visit.   Facility-Administered Medications Ordered in Other Visits:  .  dextrose 5 % solution, , Intravenous, Once, Randa Evens C, MD .  heparin lock flush 100 unit/mL, 500 Units, Intravenous, Once, Sindy Guadeloupe, MD  Physical exam:  Vitals:   06/09/18 0831  BP: (!) 156/78  Pulse: 64  Resp: 18  Temp: (!) 96 F (35.6 C)  TempSrc: Tympanic  Weight: 183 lb 12.8 oz (83.4 kg)   Physical Exam HENT:     Head: Normocephalic and atraumatic.  Eyes:     Pupils: Pupils are equal, round, and reactive to light.  Neck:     Musculoskeletal: Normal range of motion.  Cardiovascular:     Rate and Rhythm: Normal rate and regular rhythm.     Heart sounds: Murmur present.  Pulmonary:     Effort: Pulmonary effort is normal.     Breath sounds: Normal breath sounds.  Abdominal:     General: Bowel sounds are normal.     Palpations: Abdomen is soft.  Skin:    General: Skin is warm and dry.  Neurological:     Mental Status: He is alert and oriented to person, place, and time.      CMP Latest Ref Rng & Units 06/09/2018  Glucose 70 - 99 mg/dL 143(H)  BUN 8 - 23 mg/dL 25(H)  Creatinine 0.61 - 1.24 mg/dL 1.14  Sodium 135 - 145 mmol/L 138  Potassium 3.5 - 5.1 mmol/L 4.1  Chloride 98 - 111 mmol/L 105  CO2 22 - 32 mmol/L 24  Calcium 8.9 - 10.3 mg/dL 8.9  Total Protein 6.5 - 8.1 g/dL 6.7  Total Bilirubin 0.3 - 1.2 mg/dL 0.3  Alkaline Phos 38 - 126 U/L 58  AST 15 - 41 U/L 25  ALT 0 - 44 U/L 13   CBC Latest Ref Rng & Units 06/09/2018  WBC 4.0 - 10.5 K/uL 5.7  Hemoglobin 13.0 - 17.0 g/dL 8.3(L)  Hematocrit 39.0 - 52.0 % 26.0(L)  Platelets 150 - 400 K/uL 267     Assessment and plan- Patient is a 85 y.o. male  with stage IV esophageal adenocarcinomacTX N0M1with metastases to the  para-aortic lymph nodes/p concurrent chemo/RT with carbo/taxol.Patient then received 2 cycles of palliative FOLFOX chemotherapy but his chemo wason hold since June 2019 due to chemo induced anemia  requiring blood transfusions and interruption in the treatmentand was restarted in November 29 when he had evidence of disease progression. He is here for on treatment assessment prior to cycle 6 of infusional 5-FU chemotherapy along with Herceptin  Constipated proceed with cycle 6 of infusional 5-FU chemotherapy and Herceptin today.  He will be receiving udencya on day 3 of pump disconnect.  I have reviewed his recent echocardiogram which shows a normal EF.  CBC and hold tube for possible transfusion in 10 days.  I will see him back in 3 weeks time prior to cycle 7 of 5-FU chemotherapy and Herceptin.  He is due for repeat CT chest abdomen and pelvis with contrast on 06/23/2018.  Overall his CEA is trending down and CEA from today is currently pending  Normocytic anemia: Secondary to chemotherapy and likely some component of pre-existing MDS.  He does not require transfusion today.  Continue to monitor   Visit Diagnosis 1. Encounter for antineoplastic chemotherapy   2. Malignant neoplasm of lower third of esophagus (HCC)   3. Antineoplastic chemotherapy induced anemia      Dr. Randa Evens, MD, MPH [facility] [phone] 06/09/2018 9:00 AM

## 2018-06-09 NOTE — Addendum Note (Signed)
Addended by: Sindy Guadeloupe on: 06/09/2018 09:34 AM   Modules accepted: Orders

## 2018-06-10 LAB — CEA: CEA: 8.4 ng/mL — ABNORMAL HIGH (ref 0.0–4.7)

## 2018-06-11 ENCOUNTER — Inpatient Hospital Stay: Payer: Medicare Other

## 2018-06-11 DIAGNOSIS — C159 Malignant neoplasm of esophagus, unspecified: Secondary | ICD-10-CM

## 2018-06-11 DIAGNOSIS — Z5112 Encounter for antineoplastic immunotherapy: Secondary | ICD-10-CM | POA: Diagnosis not present

## 2018-06-11 MED ORDER — HEPARIN SOD (PORK) LOCK FLUSH 100 UNIT/ML IV SOLN
500.0000 [IU] | Freq: Once | INTRAVENOUS | Status: AC | PRN
  Administered 2018-06-11: 500 [IU]
  Filled 2018-06-11: qty 5

## 2018-06-11 MED ORDER — SODIUM CHLORIDE 0.9% FLUSH
10.0000 mL | INTRAVENOUS | Status: DC | PRN
  Administered 2018-06-11: 10 mL
  Filled 2018-06-11: qty 10

## 2018-06-11 MED ORDER — PEGFILGRASTIM-CBQV 6 MG/0.6ML ~~LOC~~ SOSY
6.0000 mg | PREFILLED_SYRINGE | Freq: Once | SUBCUTANEOUS | Status: AC
  Administered 2018-06-11: 6 mg via SUBCUTANEOUS
  Filled 2018-06-11: qty 0.6

## 2018-06-19 ENCOUNTER — Inpatient Hospital Stay: Payer: Medicare Other

## 2018-06-19 DIAGNOSIS — D6481 Anemia due to antineoplastic chemotherapy: Secondary | ICD-10-CM

## 2018-06-19 DIAGNOSIS — T451X5A Adverse effect of antineoplastic and immunosuppressive drugs, initial encounter: Secondary | ICD-10-CM

## 2018-06-19 DIAGNOSIS — C155 Malignant neoplasm of lower third of esophagus: Secondary | ICD-10-CM

## 2018-06-19 DIAGNOSIS — Z5112 Encounter for antineoplastic immunotherapy: Secondary | ICD-10-CM | POA: Diagnosis not present

## 2018-06-19 LAB — CBC WITH DIFFERENTIAL/PLATELET
Abs Immature Granulocytes: 0.41 10*3/uL — ABNORMAL HIGH (ref 0.00–0.07)
Basophils Absolute: 0.1 10*3/uL (ref 0.0–0.1)
Basophils Relative: 0 %
EOS ABS: 0.4 10*3/uL (ref 0.0–0.5)
Eosinophils Relative: 2 %
HEMATOCRIT: 25.5 % — AB (ref 39.0–52.0)
Hemoglobin: 8.1 g/dL — ABNORMAL LOW (ref 13.0–17.0)
Immature Granulocytes: 3 %
Lymphocytes Relative: 9 %
Lymphs Abs: 1.4 10*3/uL (ref 0.7–4.0)
MCH: 33.2 pg (ref 26.0–34.0)
MCHC: 31.8 g/dL (ref 30.0–36.0)
MCV: 104.5 fL — ABNORMAL HIGH (ref 80.0–100.0)
Monocytes Absolute: 1.4 10*3/uL — ABNORMAL HIGH (ref 0.1–1.0)
Monocytes Relative: 9 %
Neutro Abs: 12.5 10*3/uL — ABNORMAL HIGH (ref 1.7–7.7)
Neutrophils Relative %: 77 %
Platelets: 203 10*3/uL (ref 150–400)
RBC: 2.44 MIL/uL — ABNORMAL LOW (ref 4.22–5.81)
RDW: 17.1 % — ABNORMAL HIGH (ref 11.5–15.5)
WBC: 16.2 10*3/uL — ABNORMAL HIGH (ref 4.0–10.5)
nRBC: 0 % (ref 0.0–0.2)

## 2018-06-19 LAB — SAMPLE TO BLOOD BANK

## 2018-06-20 ENCOUNTER — Inpatient Hospital Stay: Payer: Medicare Other

## 2018-06-23 ENCOUNTER — Ambulatory Visit
Admission: RE | Admit: 2018-06-23 | Discharge: 2018-06-23 | Disposition: A | Discharge status: Home / Self Care | Payer: Medicare Other | Source: Ambulatory Visit | Attending: Oncology | Admitting: Oncology

## 2018-06-23 DIAGNOSIS — C159 Malignant neoplasm of esophagus, unspecified: Secondary | ICD-10-CM | POA: Diagnosis not present

## 2018-06-23 MED ORDER — IOHEXOL 300 MG/ML  SOLN
100.0000 mL | Freq: Once | INTRAMUSCULAR | Status: AC | PRN
  Administered 2018-06-23: 100 mL via INTRAVENOUS

## 2018-06-30 ENCOUNTER — Encounter: Payer: Self-pay | Admitting: Oncology

## 2018-06-30 ENCOUNTER — Other Ambulatory Visit: Payer: Self-pay

## 2018-06-30 ENCOUNTER — Inpatient Hospital Stay: Payer: Medicare Other | Attending: Oncology

## 2018-06-30 ENCOUNTER — Other Ambulatory Visit: Payer: Self-pay | Admitting: *Deleted

## 2018-06-30 ENCOUNTER — Inpatient Hospital Stay (HOSPITAL_BASED_OUTPATIENT_CLINIC_OR_DEPARTMENT_OTHER): Payer: Medicare Other | Admitting: Oncology

## 2018-06-30 ENCOUNTER — Inpatient Hospital Stay: Payer: Medicare Other

## 2018-06-30 VITALS — BP 156/91 | HR 62 | Temp 98.0°F | Resp 16 | Ht 71.0 in | Wt 181.7 lb

## 2018-06-30 VITALS — BP 141/65 | HR 60 | Resp 18

## 2018-06-30 DIAGNOSIS — C155 Malignant neoplasm of lower third of esophagus: Secondary | ICD-10-CM

## 2018-06-30 DIAGNOSIS — C159 Malignant neoplasm of esophagus, unspecified: Secondary | ICD-10-CM

## 2018-06-30 DIAGNOSIS — D649 Anemia, unspecified: Secondary | ICD-10-CM

## 2018-06-30 DIAGNOSIS — E039 Hypothyroidism, unspecified: Secondary | ICD-10-CM

## 2018-06-30 DIAGNOSIS — Z5112 Encounter for antineoplastic immunotherapy: Secondary | ICD-10-CM | POA: Insufficient documentation

## 2018-06-30 DIAGNOSIS — Z7189 Other specified counseling: Secondary | ICD-10-CM

## 2018-06-30 DIAGNOSIS — D6481 Anemia due to antineoplastic chemotherapy: Secondary | ICD-10-CM

## 2018-06-30 DIAGNOSIS — C771 Secondary and unspecified malignant neoplasm of intrathoracic lymph nodes: Secondary | ICD-10-CM | POA: Insufficient documentation

## 2018-06-30 DIAGNOSIS — D709 Neutropenia, unspecified: Secondary | ICD-10-CM | POA: Diagnosis not present

## 2018-06-30 DIAGNOSIS — T451X5A Adverse effect of antineoplastic and immunosuppressive drugs, initial encounter: Secondary | ICD-10-CM

## 2018-06-30 LAB — COMPREHENSIVE METABOLIC PANEL
ALBUMIN: 3.9 g/dL (ref 3.5–5.0)
ALT: 14 U/L (ref 0–44)
AST: 21 U/L (ref 15–41)
Alkaline Phosphatase: 60 U/L (ref 38–126)
Anion gap: 7 (ref 5–15)
BUN: 33 mg/dL — ABNORMAL HIGH (ref 8–23)
CO2: 23 mmol/L (ref 22–32)
Calcium: 8.7 mg/dL — ABNORMAL LOW (ref 8.9–10.3)
Chloride: 106 mmol/L (ref 98–111)
Creatinine, Ser: 1.23 mg/dL (ref 0.61–1.24)
GFR calc Af Amer: >60 mL/min (ref >60)
GFR calc non Af Amer: 54 mL/min — ABNORMAL LOW (ref >60)
GLUCOSE: 133 mg/dL — AB (ref 70–99)
Potassium: 4.1 mmol/L (ref 3.5–5.1)
SODIUM: 136 mmol/L (ref 135–145)
Total Bilirubin: 0.4 mg/dL (ref 0.3–1.2)
Total Protein: 6.9 g/dL (ref 6.5–8.1)

## 2018-06-30 LAB — CBC WITH DIFFERENTIAL/PLATELET
ABS IMMATURE GRANULOCYTES: 0.04 10*3/uL (ref 0.00–0.07)
BASOS PCT: 0 %
Basophils Absolute: 0 10*3/uL (ref 0.0–0.1)
Eosinophils Absolute: 0.1 10*3/uL (ref 0.0–0.5)
Eosinophils Relative: 2 %
HCT: 24.4 % — ABNORMAL LOW (ref 39.0–52.0)
Hemoglobin: 7.7 g/dL — ABNORMAL LOW (ref 13.0–17.0)
Immature Granulocytes: 1 %
LYMPHS PCT: 15 %
Lymphs Abs: 1 10*3/uL (ref 0.7–4.0)
MCH: 32.6 pg (ref 26.0–34.0)
MCHC: 31.6 g/dL (ref 30.0–36.0)
MCV: 103.4 fL — ABNORMAL HIGH (ref 80.0–100.0)
Monocytes Absolute: 0.7 10*3/uL (ref 0.1–1.0)
Monocytes Relative: 11 %
NEUTROS ABS: 4.7 10*3/uL (ref 1.7–7.7)
Neutrophils Relative %: 71 %
PLATELETS: 236 10*3/uL (ref 150–400)
RBC: 2.36 MIL/uL — ABNORMAL LOW (ref 4.22–5.81)
RDW: 16.4 % — ABNORMAL HIGH (ref 11.5–15.5)
WBC: 6.7 10*3/uL (ref 4.0–10.5)
nRBC: 0 % (ref 0.0–0.2)

## 2018-06-30 LAB — TSH: TSH: 3.932 u[IU]/mL (ref 0.350–4.500)

## 2018-06-30 MED ORDER — SODIUM CHLORIDE 0.9 % IV SOLN
Freq: Once | INTRAVENOUS | Status: AC
  Administered 2018-06-30: 10:00:00 via INTRAVENOUS
  Filled 2018-06-30: qty 250

## 2018-06-30 MED ORDER — LIDOCAINE-PRILOCAINE 2.5-2.5 % EX CREA: TOPICAL_CREAM | CUTANEOUS | 3 refills | Status: AC

## 2018-06-30 MED ORDER — HEPARIN SOD (PORK) LOCK FLUSH 100 UNIT/ML IV SOLN
500.0000 [IU] | Freq: Once | INTRAVENOUS | Status: AC
  Administered 2018-06-30: 500 [IU] via INTRAVENOUS
  Filled 2018-06-30: qty 5

## 2018-06-30 MED ORDER — SODIUM CHLORIDE 0.9 % IV SOLN
200.0000 mg | Freq: Once | INTRAVENOUS | Status: AC
  Administered 2018-06-30: 200 mg via INTRAVENOUS
  Filled 2018-06-30: qty 8

## 2018-06-30 MED ORDER — SODIUM CHLORIDE 0.9% FLUSH
10.0000 mL | Freq: Once | INTRAVENOUS | Status: AC
  Administered 2018-06-30: 10 mL via INTRAVENOUS
  Filled 2018-06-30: qty 10

## 2018-06-30 NOTE — Progress Notes (Signed)
hgb 7.7, ok to proceed per md

## 2018-06-30 NOTE — Progress Notes (Signed)
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 Mass was nonobstructing, partially obstructing and not circumferential. 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 Angiolymphatic invasion was present. Fundic gland polyp was noted in the stomach which was negative for H. pylori dysplasia and malignancy.  Patient underwent PET/CT scan on 05/28/2017 which showed small hypermetabolic mass in the distal esophagus with an SUV of 10.7. Hypermetabolic left periaortic adenopathy compatible with malignancy. Larger of the 2 involved lymph nodes with a maximum SUV of 16.1. No other regions of tumor are identified.  Tumor marker testing shows he is positive for HER-2 and PDL 1 expressed  Biopsy of the left para-aortic lymph node positive for metastatic esophageal cancer  Patient completed concurrent chemo/RT on 07/24/17. He did miss his last dose of chemotherapy due to neutropenia  Repeat PET/Ct scan showed:IMPRESSION: 1. Today's study demonstrates a positive response to therapy with regression of the primary lesion in the distal esophagus and decreased hypermetabolism in retroperitoneal lymph nodes which are essentially unchanged in size. 2. There is a new focus of  hypermetabolism in the proximal stomach just distal to the gastroesophageal junction. This is of uncertain etiology and significance, and could be physiologic, but correlation with endoscopy may be considered.  MUGA scan showed normal EF  Chemotherapy has been on hold since 10/15/17 due to persistent anemia requiring blood transfusions. He only received 2 cycles.Scans in November 2019 showed progression of adenopathy in the retroperitoneal and gastrohepatic region. Infusional 5-FU and Herceptin was restarted omitting bolus 5-FU and oxaliplatin.He has still been requiring intermittent blood transfusions due to his baseline anemia with a hemoglobin of 10 and a probable component of MDS given his age   Interval history-he reports mild fatigue but otherwise is doing well and denies any specific complaints at this time  ECOG PS- 1 Pain scale- 0   Review of systems- Review of Systems  Constitutional: Positive for malaise/fatigue. Negative for chills, fever and weight loss.  HENT: Negative for congestion, ear discharge and nosebleeds.   Eyes: Negative for blurred vision.  Respiratory: Negative for cough, hemoptysis, sputum production, shortness of breath and wheezing.   Cardiovascular: Negative for chest pain, palpitations, orthopnea and claudication.  Gastrointestinal: Negative for abdominal pain, blood in stool, constipation, diarrhea, heartburn, melena, nausea and vomiting.  Genitourinary: Negative for dysuria, flank pain, frequency, hematuria and urgency.  Musculoskeletal: Negative for back pain, joint pain and myalgias.  Skin: Negative for rash.  Neurological: Negative for dizziness, tingling, focal weakness, seizures, weakness and headaches.  Endo/Heme/Allergies: Does not bruise/bleed easily.  Psychiatric/Behavioral: Negative for depression and suicidal ideas. The patient does not have insomnia.       Allergies  Allergen Reactions  . Morphine And Related Nausea Only      Past Medical History:  Diagnosis Date  . Anemia   . Arthritis   . Coronary artery disease   .  Diverticulitis   . Dysrhythmia   . Esophageal cancer ([place]) 05/2017   Chemo + rad tx's.   Marland Kitchen GERD (gastroesophageal reflux disease)   . Heart murmur   . History of hiatal hernia   . Hyperglycemia   . Hyperlipidemia   . Hypertension   . Hypothyroidism   . Spinal stenosis      Past Surgical History:  Procedure Laterality Date  . BACK SURGERY    . CATARACT EXTRACTION W/PHACO Left 11/08/2014   Procedure: CATARACT EXTRACTION PHACO AND INTRAOCULAR LENS PLACEMENT (IOC);  Surgeon: Estill Cotta, MD;  Location: [facility] ORS;  Service: Ophthalmology;  Laterality: Left;  US:01:20 AP:21.8 CDE:31.09 PAC LOT: [id number]  . CHOLECYSTECTOMY    . ESOPHAGOGASTRODUODENOSCOPY (EGD) WITH PROPOFOL N/A 05/23/2017   Procedure: ESOPHAGOGASTRODUODENOSCOPY (EGD) WITH PROPOFOL;  Surgeon: Lollie Sails, MD;  Location: [facility] ENDOSCOPY;  Service: Endoscopy;  Laterality: N/A;  . EYE SURGERY    . IR FLUORO GUIDE PORT INSERTION RIGHT  06/07/2017  . JOINT REPLACEMENT Right    Knee  . ROTATOR CUFF REPAIR Right     Social History   Socioeconomic History  . Marital status: Married    Spouse name: Not on file  . Number of children: Not on file  . Years of education: Not on file  . Highest education level: Not on file  Occupational History  . Not on file  Social Needs  . Financial resource strain: Not on file  . Food insecurity:    Worry: Not on file    Inability: Not on file  . Transportation needs:    Medical: Not on file    Non-medical: Not on file  Tobacco Use  . Smoking status: Former Smoker    Last attempt to quit: 03/24/1989    Years since quitting: 29.2  . Smokeless tobacco: Never Used  Substance and Sexual Activity  . Alcohol use: No    Frequency: Never  . Drug use: No  . Sexual activity: Not Currently  Lifestyle  . Physical activity:    Days per week: Not on file    Minutes per  session: Not on file  . Stress: Not on file  Relationships  . Social connections:    Talks on phone: Not on file    Gets together: Not on file    Attends religious service: Not on file    Active member of club or organization: Not on file    Attends meetings of clubs or organizations: Not on file    Relationship status: Not on file  . Intimate partner violence:    Fear of current or ex partner: Not on file    Emotionally abused: Not on file    Physically abused: Not on file    Forced sexual activity: Not on file  Other Topics Concern  . Not on file  Social History Narrative  . Not on file    Family History  Problem Relation Age of Onset  . Colon cancer Mother      Current Outpatient Medications:  .  acetaminophen (TYLENOL) 650 MG CR tablet, Take 650 mg by mouth every 8 (eight) hours as needed for pain., Disp: , Rfl:  .  ascorbic acid (VITAMIN C) 1000 MG tablet, Take 1,000 mg by mouth daily., Disp: , Rfl:  .  docusate sodium (COLACE) 100 MG capsule, Take 100 mg by mouth daily., Disp: , Rfl:  .  levothyroxine (SYNTHROID, LEVOTHROID) 25 MCG tablet, Take 25 mcg by mouth daily before breakfast., Disp: ,  Rfl:  .  metoprolol tartrate (LOPRESSOR) 50 MG tablet, Take 50 mg by mouth 2 (two) times daily., Disp: , Rfl:  .  omeprazole (PRILOSEC) 20 MG capsule, Take 20 mg by mouth daily., Disp: , Rfl:  .  pravastatin (PRAVACHOL) 20 MG tablet, Take 20 mg by mouth daily., Disp: , Rfl:  .  predniSONE (DELTASONE) 5 MG tablet, Take 5 mg by mouth daily with breakfast., Disp: , Rfl:  .  Probiotic Product (PROBIOTIC COMPLEX ACIDOPHILUS PO), Take by mouth., Disp: , Rfl:  .  quinapril (ACCUPRIL) 40 MG tablet, Take 40 mg by mouth 1 day or 1 dose., Disp: , Rfl:  .  tamsulosin (FLOMAX) 0.4 MG CAPS capsule, Take 0.4 mg by mouth daily., Disp: , Rfl:  .  traMADol (ULTRAM-ER) 100 MG 24 hr tablet, Take 50 mg by mouth 2 (two) times daily. With lunch and dinner , Disp: , Rfl:  .  traMADol (ULTRAM-ER) 300 MG 24  hr tablet, Take 300 mg by mouth daily. AM dose, Disp: , Rfl:  .  lidocaine-prilocaine (EMLA) cream, Place small amt over port site 1 hour before each treatment, cover with saran wrap to protect clothing, Disp: 30 g, Rfl: 3  Physical exam:  Vitals:   06/30/18 0846 06/30/18 0851  BP:  (!) 156/91  Pulse:  62  Resp: 16   Temp:  98 F (36.7 C)  TempSrc:  Tympanic  Weight: 181 lb 11.2 oz (82.4 kg)   Height: '5\' 11"'$  (1.803 m)    Physical Exam Constitutional:      General: He is not in acute distress. HENT:     Head: Normocephalic and atraumatic.  Eyes:     Pupils: Pupils are equal, round, and reactive to light.  Neck:     Musculoskeletal: Normal range of motion.  Cardiovascular:     Rate and Rhythm: Normal rate and regular rhythm.     Heart sounds: Normal heart sounds.  Pulmonary:     Effort: Pulmonary effort is normal.     Breath sounds: Normal breath sounds.  Abdominal:     General: Bowel sounds are normal.     Palpations: Abdomen is soft.  Skin:    General: Skin is warm and dry.  Neurological:     Mental Status: He is alert and oriented to person, place, and time.      CMP Latest Ref Rng & Units 06/30/2018  Glucose 70 - 99 mg/dL 133(H)  BUN 8 - 23 mg/dL 33(H)  Creatinine 0.61 - 1.24 mg/dL 1.23  Sodium 135 - 145 mmol/L 136  Potassium 3.5 - 5.1 mmol/L 4.1  Chloride 98 - 111 mmol/L 106  CO2 22 - 32 mmol/L 23  Calcium 8.9 - 10.3 mg/dL 8.7(L)  Total Protein 6.5 - 8.1 g/dL 6.9  Total Bilirubin 0.3 - 1.2 mg/dL 0.4  Alkaline Phos 38 - 126 U/L 60  AST 15 - 41 U/L 21  ALT 0 - 44 U/L 14   CBC Latest Ref Rng & Units 06/30/2018  WBC 4.0 - 10.5 K/uL 6.7  Hemoglobin 13.0 - 17.0 g/dL 7.7(L)  Hematocrit 39.0 - 52.0 % 24.4(L)  Platelets 150 - 400 K/uL 236      Ct Chest W Contrast  Result Date: 06/23/2018 CLINICAL DATA:  Esophageal cancer.  On chemotherapy.  Anemia. EXAM: CT CHEST, ABDOMEN, AND PELVIS WITH CONTRAST TECHNIQUE: Multidetector CT imaging of the chest, abdomen and  pelvis was performed following the standard protocol during bolus administration of intravenous contrast. CONTRAST:  149m OMNIPAQUE IOHEXOL 300 MG/ML  SOLN COMPARISON:  03/04/2018 FINDINGS: CT CHEST FINDINGS Cardiovascular: Right Port-A-Cath tip at superior caval/atrial junction. Aortic and branch vessel atherosclerosis. Normal heart size, without pericardial effusion. No central pulmonary embolism, on this non-dedicated study. Mediastinum/Nodes: No supraclavicular adenopathy. No mediastinal or hilar adenopathy. Small hiatal hernia. Distal esophageal wall thickening is decreased. A periesophageal/retrocrural node measures 7 mm on image 54/2 and is enlarged from 5 mm on the prior. Lungs/Pleura: Trace left pleural thickening medially, similar. Mild centrilobular emphysema. Tiny right upper and right lower lobe pulmonary nodules are similar and may be calcified. Example in the right upper lobe on image 41/3 and right lower lobe on image 128/3. Musculoskeletal: No acute osseous abnormality. CT ABDOMEN PELVIS FINDINGS Hepatobiliary: Normal liver. Cholecystectomy, without biliary ductal dilatation. Pancreas: Mild pancreatic atrophy, without duct dilatation or dominant mass. Spleen: Normal in size, without focal abnormality. Adrenals/Urinary Tract: Normal adrenal glands. Mild right renal cortical thinning. Normal left kidney, without hydronephrosis or hydroureter. A right-sided bladder diverticulum is unchanged. Stomach/Bowel: Normal distal stomach. Colonic stool burden suggests constipation. Normal terminal ileum and appendix. Normal small bowel. Vascular/Lymphatic: Aortic and branch vessel atherosclerosis. Retroperitoneal adenopathy. Index left periaortic node measures 1.5 cm on image 75/2. Compare 1.3 cm on the prior. More posterior left periaortic node measures 1.5 cm on image 74/2 and is not significantly changed. Gastrohepatic ligament node measures 2.0 x 1.7 cm on image 59/2. Similar to 2.1 x 1.7 cm on the prior. A  retrocaval node measures 5 mm on image 68/2 and is enlarged from 2 mm on the prior. No pelvic sidewall adenopathy. Reproductive: Normal prostate.  A small right-sided hydrocele. Other: No significant free fluid. Musculoskeletal: Degenerative partial fusion of the bilateral sacroiliac joints. Lower lumbar spondylosis. S-shaped thoracolumbar spine curvature. IMPRESSION: 1. Mild disease progression compared to 03/04/2018. Although the majority of enlarged abdominal nodes are similar, there are enlarging nodes within the abdominal retroperitoneum and retrocrural/lperiesophageal spaces of the lower chest. 2. Small hiatal hernia. Improved to resolved lower esophageal wall thickening. 3. Tiny right-sided pulmonary nodules are unchanged, favored to represent calcified granulomas. 4. Aortic atherosclerosis (ICD10-I70.0) and emphysema (ICD10-J43.9). 5. Right-sided bladder diverticulum. Electronically Signed   By: KAbigail MiyamotoM.D.   On: 06/23/2018 09:32   Ct Abdomen Pelvis W Contrast  Result Date: 06/23/2018 CLINICAL DATA:  Esophageal cancer.  On chemotherapy.  Anemia. EXAM: CT CHEST, ABDOMEN, AND PELVIS WITH CONTRAST TECHNIQUE: Multidetector CT imaging of the chest, abdomen and pelvis was performed following the standard protocol during bolus administration of intravenous contrast. CONTRAST:  1027mOMNIPAQUE IOHEXOL 300 MG/ML  SOLN COMPARISON:  03/04/2018 FINDINGS: CT CHEST FINDINGS Cardiovascular: Right Port-A-Cath tip at superior caval/atrial junction. Aortic and branch vessel atherosclerosis. Normal heart size, without pericardial effusion. No central pulmonary embolism, on this non-dedicated study. Mediastinum/Nodes: No supraclavicular adenopathy. No mediastinal or hilar adenopathy. Small hiatal hernia. Distal esophageal wall thickening is decreased. A periesophageal/retrocrural node measures 7 mm on image 54/2 and is enlarged from 5 mm on the prior. Lungs/Pleura: Trace left pleural thickening medially, similar. Mild  centrilobular emphysema. Tiny right upper and right lower lobe pulmonary nodules are similar and may be calcified. Example in the right upper lobe on image 41/3 and right lower lobe on image 128/3. Musculoskeletal: No acute osseous abnormality. CT ABDOMEN PELVIS FINDINGS Hepatobiliary: Normal liver. Cholecystectomy, without biliary ductal dilatation. Pancreas: Mild pancreatic atrophy, without duct dilatation or dominant mass. Spleen: Normal in size, without focal abnormality. Adrenals/Urinary Tract: Normal adrenal glands. Mild right renal cortical thinning.  Normal left kidney, without hydronephrosis or hydroureter. A right-sided bladder diverticulum is unchanged. Stomach/Bowel: Normal distal stomach. Colonic stool burden suggests constipation. Normal terminal ileum and appendix. Normal small bowel. Vascular/Lymphatic: Aortic and branch vessel atherosclerosis. Retroperitoneal adenopathy. Index left periaortic node measures 1.5 cm on image 75/2. Compare 1.3 cm on the prior. More posterior left periaortic node measures 1.5 cm on image 74/2 and is not significantly changed. Gastrohepatic ligament node measures 2.0 x 1.7 cm on image 59/2. Similar to 2.1 x 1.7 cm on the prior. A retrocaval node measures 5 mm on image 68/2 and is enlarged from 2 mm on the prior. No pelvic sidewall adenopathy. Reproductive: Normal prostate.  A small right-sided hydrocele. Other: No significant free fluid. Musculoskeletal: Degenerative partial fusion of the bilateral sacroiliac joints. Lower lumbar spondylosis. S-shaped thoracolumbar spine curvature. IMPRESSION: 1. Mild disease progression compared to 03/04/2018. Although the majority of enlarged abdominal nodes are similar, there are enlarging nodes within the abdominal retroperitoneum and retrocrural/lperiesophageal spaces of the lower chest. 2. Small hiatal hernia. Improved to resolved lower esophageal wall thickening. 3. Tiny right-sided pulmonary nodules are unchanged, favored to  represent calcified granulomas. 4. Aortic atherosclerosis (ICD10-I70.0) and emphysema (ICD10-J43.9). 5. Right-sided bladder diverticulum. Electronically Signed   By: Abigail Miyamoto M.D.   On: 06/23/2018 09:32     Assessment and plan- Patient is a 85 y.o. male with stage IV esophageal adenocarcinomacTX N0M1with metastases to the para-aortic lymph nodes/p concurrent chemo/RT with carbo/taxol.Patient then received 2 cycles of palliative FOLFOX chemotherapy but his chemo wason hold since June 2019 due to chemo induced anemia requiring blood transfusions and interruption in the treatmentand was restarted in November 29 when he had evidence of disease progression.  He is s/p 6 cycles of infusional 5-FU chemotherapy with Herceptin and here to discuss the results of his CT scan  I have reviewed CT chest abdomen pelvis images independently and discussed findings with the patient.  He has had 6 cycles of infusional 5-FU chemotherapy and Herceptin now.  CT unfortunately shows progression of disease with increase in the size of his lymph nodes especially in the retroperitoneal and para-aortic regions.  Gastrohepatic ligament lymph node appears stable in size.  Also his CEA which trended down from 11-4 is back up to 8.3.  I therefore recommend switching to second line therapy at this time.  Given that his CTA score was greater than 1 with a expression of 30% I would recommend Keytruda 200 mg IV every 3 weeks until progression or toxicity.  Discussed risks and benefits of immunotherapy including all but not limited to autoimmune colitis, hepatitis, thyroiditis, pneumonitis, endocrinopathies.  Treatment will be given with a palliative intent.  Patient understands and agrees to proceed as planned  He will get his first cycle of Keytruda today and I will see him back in 3 weeks time with CBC and CMP for cycle 2.  TSH from today is normal.  He will no longer be getting Herceptin.  He does have significant anemia today  with a hemoglobin of 7.7 which is secondary to chemotherapy.  We will hold off on blood transfusion today and continue to monitor     Visit Diagnosis 1. Malignant neoplasm of esophagus, unspecified location ([place])   2. Encounter for antineoplastic immunotherapy   3. Goals of care, counseling/discussion      Dr. Randa Evens, MD, MPH [facility] [phone] 06/30/2018 12:44 PM  She

## 2018-06-30 NOTE — Progress Notes (Signed)
DISCONTINUE ON PATHWAY REGIMEN - Gastroesophageal  No Medical Intervention - Off Treatment.  REASON: Disease Progression PRIOR TREATMENT: Off Treatment  START OFF PATHWAY REGIMEN - Gastroesophageal   [id number]:Pembrolizumab 200 mg q21 Days:   A cycle is 21 days:     Pembrolizumab   **Always confirm dose/schedule in your pharmacy ordering system**  Patient Characteristics: Distant Metastases (cM1/pM1) / Locally Recurrent Disease, Adenocarcinoma - Esophageal, GE Junction, and Gastric, Second Line, MSS / pMMR or MSI Unknown Histology: Adenocarcinoma Disease Classification: GE Junction Therapeutic Status: Distant Metastases (No Additional Staging) Line of Therapy: Second Line Microsatellite/Mismatch Repair Status: MSS/pMMR Intent of Therapy: Non-Curative / Palliative Intent, Discussed with Patient

## 2018-06-30 NOTE — Progress Notes (Signed)
Patient here for treatment and results. Patient reports that he is "feeling good".

## 2018-06-30 NOTE — Progress Notes (Signed)
DISCONTINUE ON PATHWAY REGIMEN - Gastroesophageal     Cycles 1 through 8: every 14 days:     Oxaliplatin      Leucovorin      5-Fluorouracil      5-Fluorouracil      Trastuzumab      Trastuzumab    Cycles 9 and beyond: every 21 days :     Trastuzumab   **Always confirm dose/schedule in your pharmacy ordering system**  REASON: Disease Progression PRIOR TREATMENT: GEOS16:  [id number] + Trastuzumab 4 mg/kg q14 Days x 8 Cycles Followed By Trastuzumab Maintenance (6 mg/kg q21 Days) Until Progression or Unacceptable Toxicity TREATMENT RESPONSE: Progressive Disease (PD)  Gastroesophageal - No Medical Intervention - Off Treatment.  Patient Characteristics: Distant Metastases (cM1/pM1) / Locally Recurrent Disease, Adenocarcinoma - Esophageal, GE Junction, and Gastric, First Line, HER2 Positive Histology: Adenocarcinoma Disease Classification: GE Junction Therapeutic Status: Distant Metastases (No Additional Staging) Line of Therapy: First Line HER2 Status: Positive

## 2018-06-30 NOTE — Progress Notes (Signed)
Proceed with Keytruda today per Dr. Janese Banks

## 2018-07-01 LAB — CEA: CEA: 9.6 ng/mL — ABNORMAL HIGH (ref 0.0–4.7)

## 2018-07-02 ENCOUNTER — Inpatient Hospital Stay: Payer: Medicare Other

## 2018-07-17 ENCOUNTER — Ambulatory Visit: Payer: Medicare Other | Admitting: Radiation Oncology

## 2018-07-18 ENCOUNTER — Telehealth: Payer: Self-pay | Admitting: *Deleted

## 2018-07-18 ENCOUNTER — Other Ambulatory Visit: Payer: Self-pay

## 2018-07-18 NOTE — Telephone Encounter (Signed)
Called today to check about his appointment on Monday.  Wanted to make sure that no one could come with him.  He asked about his EMLA cream being renewed in which it was done on March 9 with extra refills.  And that he has been having more chest pain and back pain and he wanted to talk to Dr. Judithann Graves about it.  Since he is a walkie-talkie person that yes we are only allowing him to come in if it comes to a point where he cannot come in by himself or does not feel like he would get the information that the doctor may talk to him about then we would look at having one visitor come in with him.  He understands and he will be here and he will just come by himself he does not need any help and I told him that once we check him in will discuss his back and chest pain at that time.  He is agreeable to plan and he will check with his pharmacy because we had sent the prescription in for the EMLA cream in March

## 2018-07-21 ENCOUNTER — Inpatient Hospital Stay: Payer: Medicare Other

## 2018-07-21 ENCOUNTER — Other Ambulatory Visit: Payer: Self-pay | Admitting: Oncology

## 2018-07-21 ENCOUNTER — Encounter: Payer: Self-pay | Admitting: Oncology

## 2018-07-21 ENCOUNTER — Inpatient Hospital Stay (HOSPITAL_BASED_OUTPATIENT_CLINIC_OR_DEPARTMENT_OTHER): Payer: Medicare Other | Admitting: Oncology

## 2018-07-21 ENCOUNTER — Other Ambulatory Visit: Payer: Self-pay

## 2018-07-21 VITALS — BP 138/74 | HR 86 | Temp 97.6°F | Resp 16 | Ht 71.0 in | Wt 175.5 lb

## 2018-07-21 DIAGNOSIS — Z95828 Presence of other vascular implants and grafts: Secondary | ICD-10-CM

## 2018-07-21 DIAGNOSIS — Z5112 Encounter for antineoplastic immunotherapy: Secondary | ICD-10-CM

## 2018-07-21 DIAGNOSIS — C771 Secondary and unspecified malignant neoplasm of intrathoracic lymph nodes: Secondary | ICD-10-CM

## 2018-07-21 DIAGNOSIS — D649 Anemia, unspecified: Secondary | ICD-10-CM

## 2018-07-21 DIAGNOSIS — C159 Malignant neoplasm of esophagus, unspecified: Secondary | ICD-10-CM

## 2018-07-21 DIAGNOSIS — D709 Neutropenia, unspecified: Secondary | ICD-10-CM | POA: Diagnosis not present

## 2018-07-21 DIAGNOSIS — N179 Acute kidney failure, unspecified: Secondary | ICD-10-CM

## 2018-07-21 DIAGNOSIS — T451X5A Adverse effect of antineoplastic and immunosuppressive drugs, initial encounter: Secondary | ICD-10-CM

## 2018-07-21 DIAGNOSIS — C155 Malignant neoplasm of lower third of esophagus: Secondary | ICD-10-CM | POA: Diagnosis not present

## 2018-07-21 DIAGNOSIS — D6481 Anemia due to antineoplastic chemotherapy: Secondary | ICD-10-CM

## 2018-07-21 LAB — COMPREHENSIVE METABOLIC PANEL
ALT: 12 U/L (ref 0–44)
AST: 24 U/L (ref 15–41)
Albumin: 3.7 g/dL (ref 3.5–5.0)
Alkaline Phosphatase: 47 U/L (ref 38–126)
Anion gap: 8 (ref 5–15)
BUN: 36 mg/dL — ABNORMAL HIGH (ref 8–23)
CO2: 22 mmol/L (ref 22–32)
Calcium: 8.8 mg/dL — ABNORMAL LOW (ref 8.9–10.3)
Chloride: 105 mmol/L (ref 98–111)
Creatinine, Ser: 1.34 mg/dL — ABNORMAL HIGH (ref 0.61–1.24)
GFR calc Af Amer: 57 mL/min — ABNORMAL LOW (ref >60)
GFR calc non Af Amer: 49 mL/min — ABNORMAL LOW (ref >60)
Glucose, Bld: 159 mg/dL — ABNORMAL HIGH (ref 70–99)
Potassium: 3.8 mmol/L (ref 3.5–5.1)
SODIUM: 135 mmol/L (ref 135–145)
Total Bilirubin: 0.3 mg/dL (ref 0.3–1.2)
Total Protein: 6.7 g/dL (ref 6.5–8.1)

## 2018-07-21 LAB — CBC WITH DIFFERENTIAL/PLATELET
Abs Immature Granulocytes: 0.02 10*3/uL (ref 0.00–0.07)
Basophils Absolute: 0 10*3/uL (ref 0.0–0.1)
Basophils Relative: 0 %
Eosinophils Absolute: 0.2 10*3/uL (ref 0.0–0.5)
Eosinophils Relative: 2 %
HEMATOCRIT: 22.4 % — AB (ref 39.0–52.0)
Hemoglobin: 7.4 g/dL — ABNORMAL LOW (ref 13.0–17.0)
Immature Granulocytes: 0 %
Lymphocytes Relative: 17 %
Lymphs Abs: 1.3 10*3/uL (ref 0.7–4.0)
MCH: 33 pg (ref 26.0–34.0)
MCHC: 33 g/dL (ref 30.0–36.0)
MCV: 100 fL (ref 80.0–100.0)
Monocytes Absolute: 0.6 10*3/uL (ref 0.1–1.0)
Monocytes Relative: 8 %
NEUTROS ABS: 5.7 10*3/uL (ref 1.7–7.7)
Neutrophils Relative %: 73 %
Platelets: 221 10*3/uL (ref 150–400)
RBC: 2.24 MIL/uL — ABNORMAL LOW (ref 4.22–5.81)
RDW: 13.9 % (ref 11.5–15.5)
WBC: 7.8 10*3/uL (ref 4.0–10.5)
nRBC: 0 % (ref 0.0–0.2)

## 2018-07-21 LAB — VITAMIN B12: Vitamin B-12: 842 pg/mL (ref 180–914)

## 2018-07-21 LAB — IRON AND TIBC
Iron: 23 ug/dL — ABNORMAL LOW (ref 45–182)
Saturation Ratios: 6 % — ABNORMAL LOW (ref 17.9–39.5)
TIBC: 393 ug/dL (ref 250–450)
UIBC: 370 ug/dL

## 2018-07-21 LAB — FOLATE: Folate: 25 ng/mL (ref >5.9)

## 2018-07-21 LAB — FERRITIN: Ferritin: 23 ng/mL — ABNORMAL LOW (ref 24–336)

## 2018-07-21 MED ORDER — HEPARIN SOD (PORK) LOCK FLUSH 100 UNIT/ML IV SOLN
500.0000 [IU] | Freq: Once | INTRAVENOUS | Status: AC | PRN
  Administered 2018-07-21: 500 [IU]

## 2018-07-21 MED ORDER — HEPARIN SOD (PORK) LOCK FLUSH 100 UNIT/ML IV SOLN
INTRAVENOUS | Status: AC
  Filled 2018-07-21: qty 5

## 2018-07-21 MED ORDER — SODIUM CHLORIDE 0.9 % IV SOLN
INTRAVENOUS | Status: AC
  Administered 2018-07-21: 09:00:00 via INTRAVENOUS
  Filled 2018-07-21 (×2): qty 250

## 2018-07-21 MED ORDER — SODIUM CHLORIDE 0.9 % IV SOLN
Freq: Once | INTRAVENOUS | Status: DC
  Filled 2018-07-21: qty 250

## 2018-07-21 MED ORDER — SODIUM CHLORIDE 0.9 % IV SOLN
200.0000 mg | Freq: Once | INTRAVENOUS | Status: AC
  Administered 2018-07-21: 200 mg via INTRAVENOUS
  Filled 2018-07-21: qty 8

## 2018-07-21 MED ORDER — SODIUM CHLORIDE 0.9% FLUSH
10.0000 mL | Freq: Once | INTRAVENOUS | Status: AC
  Filled 2018-07-21: qty 10

## 2018-07-21 MED ORDER — SODIUM CHLORIDE 0.9% FLUSH
10.0000 mL | Freq: Once | INTRAVENOUS | Status: AC
  Administered 2018-07-21: 10 mL via INTRAVENOUS
  Filled 2018-07-21: qty 10

## 2018-07-21 NOTE — Progress Notes (Signed)
Hbg is 7.4 today. Proceed with Keytruda per Dr Janese Banks

## 2018-07-21 NOTE — Progress Notes (Signed)
Pt has pain after eating for about 1 hour and in mid chest. He feels like sleeping a lot. Tired all the time. He usually cooks for him and his wife and he has not felt like it. Family bring some food for them.

## 2018-07-21 NOTE — Progress Notes (Signed)
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 Mass was nonobstructing, partially obstructing and not circumferential. 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 Angiolymphatic invasion was present. Fundic gland polyp was noted in the stomach which was negative for H. pylori dysplasia and malignancy.  Patient underwent PET/CT scan on 05/28/2017 which showed small hypermetabolic mass in the distal esophagus with an SUV of 10.7. Hypermetabolic left periaortic adenopathy compatible with malignancy. Larger of the 2 involved lymph nodes with a maximum SUV of 16.1. No other regions of tumor are identified.  Tumor marker testing shows he is positive for HER-2 and PDL 1 expressed  Biopsy of the left para-aortic lymph node positive for metastatic esophageal cancer  Patient completed concurrent chemo/RT on 07/24/17. He did miss his last dose of chemotherapy due to neutropenia  Repeat PET/Ct scan showed:IMPRESSION: 1. Today's study demonstrates a positive response to therapy with regression of the primary lesion in the distal esophagus and decreased hypermetabolism in retroperitoneal lymph nodes which are essentially unchanged in size. 2. There is a new focus of  hypermetabolism in the proximal stomach just distal to the gastroesophageal junction. This is of uncertain etiology and significance, and could be physiologic, but correlation with endoscopy may be considered.  MUGA scan showed normal EF  Chemotherapy has been on hold since 10/15/17 due to persistent anemia requiring blood transfusions. He only received 2 cycles.Scans in November 2019 showed progression of adenopathy in the retroperitoneal and gastrohepatic region. Infusional 5-FU and Herceptin was restarted omitting bolus 5-FU and oxaliplatin.He has still been requiring intermittent blood transfusions due to his baseline anemia with a hemoglobin of 10 and a probable component of MDS given his age   Interval history- reports feeling fatigued. This has been getting worse over last 3 weeks. Also reports difficulty swallowing some foods and it takes almost an hour for the pain to resolve. Denies any exertional sob/ chest pain  ECOG PS- 1 Pain scale- 0   Review of systems- Review of Systems  Constitutional: Positive for malaise/fatigue. Negative for chills, fever and weight loss.  HENT: Negative for congestion, ear discharge and nosebleeds.   Eyes: Negative for blurred vision.  Respiratory: Negative for cough, hemoptysis, sputum production, shortness of breath and wheezing.   Cardiovascular: Negative for chest pain, palpitations, orthopnea and claudication.  Gastrointestinal: Negative for abdominal pain, blood in stool, constipation, diarrhea, heartburn, melena, nausea and vomiting.       Dysphagia  Genitourinary: Negative for dysuria, flank pain, frequency, hematuria and urgency.  Musculoskeletal: Negative for back pain, joint pain and myalgias.  Skin: Negative for rash.  Neurological: Negative for dizziness, tingling, focal weakness, seizures, weakness and headaches.  Endo/Heme/Allergies: Does not bruise/bleed easily.  Psychiatric/Behavioral: Negative for depression and suicidal  ideas. The patient does not have insomnia.      Allergies  Allergen Reactions   Morphine And Related  Nausea Only     Past Medical History:  Diagnosis Date   Anemia    Arthritis    Coronary artery disease    Diverticulitis    Dysrhythmia    Esophageal cancer ([place]) 05/2017   Chemo + rad tx's.    GERD (gastroesophageal reflux disease)    Heart murmur    History of hiatal hernia    Hyperglycemia    Hyperlipidemia    Hypertension    Hypothyroidism    Spinal stenosis      Past Surgical History:  Procedure Laterality Date   BACK SURGERY     CATARACT EXTRACTION W/PHACO Left 11/08/2014   Procedure: CATARACT EXTRACTION PHACO AND INTRAOCULAR LENS PLACEMENT ([place]);  Surgeon: Estill Cotta, MD;  Location: [facility] ORS;  Service: Ophthalmology;  Laterality: Left;  US:01:20 AP:21.8 CDE:31.09 PAC LOT: [id number] H   CHOLECYSTECTOMY     ESOPHAGOGASTRODUODENOSCOPY (EGD) WITH PROPOFOL N/A 05/23/2017   Procedure: ESOPHAGOGASTRODUODENOSCOPY (EGD) WITH PROPOFOL;  Surgeon: Lollie Sails, MD;  Location: [facility] ENDOSCOPY;  Service: Endoscopy;  Laterality: N/A;   EYE SURGERY     IR FLUORO GUIDE PORT INSERTION RIGHT  06/07/2017   JOINT REPLACEMENT Right    Knee   ROTATOR CUFF REPAIR Right     Social History   Socioeconomic History   Marital status: Married    Spouse name: Not on file   Number of children: Not on file   Years of education: Not on file   Highest education level: Not on file  Occupational History   Not on file  Social Needs   Financial resource strain: Not on file   Food insecurity:    Worry: Not on file    Inability: Not on file   Transportation needs:    Medical: Not on file    Non-medical: Not on file  Tobacco Use   Smoking status: Former Smoker    Last attempt to quit: 03/24/1989    Years since quitting: 29.3   Smokeless tobacco: Never Used  Substance and Sexual Activity   Alcohol use: No    Frequency: Never   Drug use: No     Sexual activity: Not Currently  Lifestyle   Physical activity:    Days per week: Not on file    Minutes per session: Not on file   Stress: Not on file  Relationships   Social connections:    Talks on phone: Not on file    Gets together: Not on file    Attends religious service: Not on file    Active member of club or organization: Not on file    Attends meetings of clubs or organizations: Not on file    Relationship status: Not on file   Intimate partner violence:    Fear of current or ex partner: Not on file    Emotionally abused: Not on file    Physically abused: Not on file    Forced sexual activity: Not on file  Other Topics Concern   Not on file  Social History Narrative   Not on file    Family History  Problem Relation Age of Onset   Colon cancer Mother      Current Outpatient Medications:    acetaminophen (TYLENOL) 650 MG CR tablet, Take 650 mg by mouth every 8 (eight) hours as needed for pain., Disp: , Rfl:    ascorbic acid (VITAMIN C) 1000 MG tablet, Take 2,000 mg by mouth daily. , Disp: , Rfl:    docusate sodium (COLACE)  100 MG capsule, Take 100 mg by mouth daily., Disp: , Rfl:    levothyroxine (SYNTHROID, LEVOTHROID) 25 MCG tablet, Take 25 mcg by mouth daily before breakfast., Disp: , Rfl:    lidocaine-prilocaine (EMLA) cream, Place small amt over port site 1 hour before each treatment, cover with saran wrap to protect clothing, Disp: 30 g, Rfl: 3   metoprolol tartrate (LOPRESSOR) 50 MG tablet, Take 50 mg by mouth 2 (two) times daily., Disp: , Rfl:    omeprazole (PRILOSEC) 20 MG capsule, Take 20 mg by mouth daily., Disp: , Rfl:    pravastatin (PRAVACHOL) 20 MG tablet, Take 20 mg by mouth daily., Disp: , Rfl:    predniSONE (DELTASONE) 5 MG tablet, Take 5 mg by mouth daily with breakfast., Disp: , Rfl:    Probiotic Product (PROBIOTIC COMPLEX ACIDOPHILUS PO), Take 1 capsule by mouth daily. , Disp: , Rfl:    quinapril (ACCUPRIL) 40 MG tablet,  Take 40 mg by mouth 1 day or 1 dose., Disp: , Rfl:    tamsulosin (FLOMAX) 0.4 MG CAPS capsule, Take 0.4 mg by mouth daily., Disp: , Rfl:    traMADol (ULTRAM-ER) 100 MG 24 hr tablet, Take 50 mg by mouth 2 (two) times daily. With lunch and dinner , Disp: , Rfl:    traMADol (ULTRAM-ER) 300 MG 24 hr tablet, Take 300 mg by mouth daily. AM dose, Disp: , Rfl:  No current facility-administered medications for this visit.   Facility-Administered Medications Ordered in Other Visits:    0.9 %  sodium chloride infusion, , Intravenous, Continuous, Sindy Guadeloupe, MD, Last Rate: 999 mL/hr at 07/21/18 0912   0.9 %  sodium chloride infusion, , Intravenous, Once, Sindy Guadeloupe, MD   pembrolizumab [facility]) 200 mg in sodium chloride 0.9 % 50 mL chemo infusion, 200 mg, Intravenous, Once, Sindy Guadeloupe, MD   sodium chloride flush (NS) 0.9 % injection 10 mL, 10 mL, Intravenous, Once, Sindy Guadeloupe, MD  Physical exam:  Vitals:   07/21/18 0823  BP: 138/74  Pulse: 86  Resp: 16  Temp: 97.6 F (36.4 C)  TempSrc: Tympanic  Weight: 175 lb 8 oz (79.6 kg)  Height: _0  (1.803 m)   Physical Exam Constitutional:      General: He is not in acute distress. HENT:     Head: Normocephalic and atraumatic.  Eyes:     Pupils: Pupils are equal, round, and reactive to light.  Neck:     Musculoskeletal: Normal range of motion.  Cardiovascular:     Rate and Rhythm: Normal rate and regular rhythm.     Heart sounds: Normal heart sounds.  Pulmonary:     Effort: Pulmonary effort is normal.     Breath sounds: Normal breath sounds.  Abdominal:     General: Bowel sounds are normal.     Palpations: Abdomen is soft.  Skin:    General: Skin is warm and dry.  Neurological:     Mental Status: He is alert and oriented to person, place, and time.      CMP Latest Ref Rng & Units 07/21/2018  Glucose 70 - 99 mg/dL 159(H)  BUN 8 - 23 mg/dL 36(H)  Creatinine 0.61 - 1.24 mg/dL 1.34(H)  Sodium 135 - 145 mmol/L 135   Potassium 3.5 - 5.1 mmol/L 3.8  Chloride 98 - 111 mmol/L 105  CO2 22 - 32 mmol/L 22  Calcium 8.9 - 10.3 mg/dL 8.8(L)  Total Protein 6.5 - 8.1 g/dL 6.7  Total  Bilirubin 0.3 - 1.2 mg/dL 0.3  Alkaline Phos 38 - 126 U/L 47  AST 15 - 41 U/L 24  ALT 0 - 44 U/L 12   CBC Latest Ref Rng & Units 07/21/2018  WBC 4.0 - 10.5 K/uL 7.8  Hemoglobin 13.0 - 17.0 g/dL 7.4(L)  Hematocrit 39.0 - 52.0 % 22.4(L)  Platelets 150 - 400 K/uL 221    No images are attached to the encounter.  Ct Chest W Contrast  Result Date: 06/23/2018 CLINICAL DATA:  Esophageal cancer.  On chemotherapy.  Anemia. EXAM: CT CHEST, ABDOMEN, AND PELVIS WITH CONTRAST TECHNIQUE: Multidetector CT imaging of the chest, abdomen and pelvis was performed following the standard protocol during bolus administration of intravenous contrast. CONTRAST:  172m OMNIPAQUE IOHEXOL 300 MG/ML  SOLN COMPARISON:  03/04/2018 FINDINGS: CT CHEST FINDINGS Cardiovascular: Right Port-A-Cath tip at superior caval/atrial junction. Aortic and branch vessel atherosclerosis. Normal heart size, without pericardial effusion. No central pulmonary embolism, on this non-dedicated study. Mediastinum/Nodes: No supraclavicular adenopathy. No mediastinal or hilar adenopathy. Small hiatal hernia. Distal esophageal wall thickening is decreased. A periesophageal/retrocrural node measures 7 mm on image 54/2 and is enlarged from 5 mm on the prior. Lungs/Pleura: Trace left pleural thickening medially, similar. Mild centrilobular emphysema. Tiny right upper and right lower lobe pulmonary nodules are similar and may be calcified. Example in the right upper lobe on image 41/3 and right lower lobe on image 128/3. Musculoskeletal: No acute osseous abnormality. CT ABDOMEN PELVIS FINDINGS Hepatobiliary: Normal liver. Cholecystectomy, without biliary ductal dilatation. Pancreas: Mild pancreatic atrophy, without duct dilatation or dominant mass. Spleen: Normal in size, without focal abnormality.  Adrenals/Urinary Tract: Normal adrenal glands. Mild right renal cortical thinning. Normal left kidney, without hydronephrosis or hydroureter. A right-sided bladder diverticulum is unchanged. Stomach/Bowel: Normal distal stomach. Colonic stool burden suggests constipation. Normal terminal ileum and appendix. Normal small bowel. Vascular/Lymphatic: Aortic and branch vessel atherosclerosis. Retroperitoneal adenopathy. Index left periaortic node measures 1.5 cm on image 75/2. Compare 1.3 cm on the prior. More posterior left periaortic node measures 1.5 cm on image 74/2 and is not significantly changed. Gastrohepatic ligament node measures 2.0 x 1.7 cm on image 59/2. Similar to 2.1 x 1.7 cm on the prior. A retrocaval node measures 5 mm on image 68/2 and is enlarged from 2 mm on the prior. No pelvic sidewall adenopathy. Reproductive: Normal prostate.  A small right-sided hydrocele. Other: No significant free fluid. Musculoskeletal: Degenerative partial fusion of the bilateral sacroiliac joints. Lower lumbar spondylosis. S-shaped thoracolumbar spine curvature. IMPRESSION: 1. Mild disease progression compared to 03/04/2018. Although the majority of enlarged abdominal nodes are similar, there are enlarging nodes within the abdominal retroperitoneum and retrocrural/lperiesophageal spaces of the lower chest. 2. Small hiatal hernia. Improved to resolved lower esophageal wall thickening. 3. Tiny right-sided pulmonary nodules are unchanged, favored to represent calcified granulomas. 4. Aortic atherosclerosis (ICD10-I70.0) and emphysema (ICD10-J43.9). 5. Right-sided bladder diverticulum. Electronically Signed   By: KAbigail MiyamotoM.D.   On: 06/23/2018 09:32   Ct Abdomen Pelvis W Contrast  Result Date: 06/23/2018 CLINICAL DATA:  Esophageal cancer.  On chemotherapy.  Anemia. EXAM: CT CHEST, ABDOMEN, AND PELVIS WITH CONTRAST TECHNIQUE: Multidetector CT imaging of the chest, abdomen and pelvis was performed following the standard  protocol during bolus administration of intravenous contrast. CONTRAST:  1080mOMNIPAQUE IOHEXOL 300 MG/ML  SOLN COMPARISON:  03/04/2018 FINDINGS: CT CHEST FINDINGS Cardiovascular: Right Port-A-Cath tip at superior caval/atrial junction. Aortic and branch vessel atherosclerosis. Normal heart size, without pericardial effusion. No central pulmonary  embolism, on this non-dedicated study. Mediastinum/Nodes: No supraclavicular adenopathy. No mediastinal or hilar adenopathy. Small hiatal hernia. Distal esophageal wall thickening is decreased. A periesophageal/retrocrural node measures 7 mm on image 54/2 and is enlarged from 5 mm on the prior. Lungs/Pleura: Trace left pleural thickening medially, similar. Mild centrilobular emphysema. Tiny right upper and right lower lobe pulmonary nodules are similar and may be calcified. Example in the right upper lobe on image 41/3 and right lower lobe on image 128/3. Musculoskeletal: No acute osseous abnormality. CT ABDOMEN PELVIS FINDINGS Hepatobiliary: Normal liver. Cholecystectomy, without biliary ductal dilatation. Pancreas: Mild pancreatic atrophy, without duct dilatation or dominant mass. Spleen: Normal in size, without focal abnormality. Adrenals/Urinary Tract: Normal adrenal glands. Mild right renal cortical thinning. Normal left kidney, without hydronephrosis or hydroureter. A right-sided bladder diverticulum is unchanged. Stomach/Bowel: Normal distal stomach. Colonic stool burden suggests constipation. Normal terminal ileum and appendix. Normal small bowel. Vascular/Lymphatic: Aortic and branch vessel atherosclerosis. Retroperitoneal adenopathy. Index left periaortic node measures 1.5 cm on image 75/2. Compare 1.3 cm on the prior. More posterior left periaortic node measures 1.5 cm on image 74/2 and is not significantly changed. Gastrohepatic ligament node measures 2.0 x 1.7 cm on image 59/2. Similar to 2.1 x 1.7 cm on the prior. A retrocaval node measures 5 mm on image 68/2  and is enlarged from 2 mm on the prior. No pelvic sidewall adenopathy. Reproductive: Normal prostate.  A small right-sided hydrocele. Other: No significant free fluid. Musculoskeletal: Degenerative partial fusion of the bilateral sacroiliac joints. Lower lumbar spondylosis. S-shaped thoracolumbar spine curvature. IMPRESSION: 1. Mild disease progression compared to 03/04/2018. Although the majority of enlarged abdominal nodes are similar, there are enlarging nodes within the abdominal retroperitoneum and retrocrural/lperiesophageal spaces of the lower chest. 2. Small hiatal hernia. Improved to resolved lower esophageal wall thickening. 3. Tiny right-sided pulmonary nodules are unchanged, favored to represent calcified granulomas. 4. Aortic atherosclerosis (ICD10-I70.0) and emphysema (ICD10-J43.9). 5. Right-sided bladder diverticulum. Electronically Signed   By: Abigail Miyamoto M.D.   On: 06/23/2018 09:32     Assessment and plan- Patient is a 85 y.o. male with stage IV esophageal adenocarcinomacTX N0M1with metastases to the para-aortic lymph nodes/p concurrent chemo/RT with carbo/taxol.Patient then received 2 cycles of palliative FOLFOX chemotherapy but his chemo wason hold since June 2019 due to chemo induced anemia requiring blood transfusions and interruption in the treatmentand was restarted in November 29 when he had evidence of disease progression.  He is s/p 6 cycles of infusional 5-FU chemotherapy with Herceptin. He is here for on treatment assessment prior to cycle 2 of keytruda  Patients baseline hemoglobin is around 10. Since he restarted chemo, it has drifted down to 7-8. I exoect his counts to improve in the next several weeks as he has been switched to [place]. Suspect his fatigue is secondary to anemia. As his hb is >7, I will hold off on blood transfusion at this time. If he continues to feel significantly symptomatic, I will consider doing aranesp for chemo induced anemia 3 weeks from now  especially given critical shortage of blood. aranesp does however carry risks of thromboembolic side effects which I have explained to the patient.  Check ferritin iron studies B12 and folate today  Counts are otherwise ok to proceed with cycle 2 of keytruda today. His creatinine is mildly elevated at 1.3 and I will give him 1L NS today  With regards to dysphagia: Patient will try modifying his diet and eating softer foods.  I will hold off  on sending him for an EGD at this time.  If his dysphagia gets worse this could be considered to see if he would be a candidate for an esophageal stent.  Palliative radiation is another option as well  I will see him back in 3 weeks time with cbc cmp and cea for cycle 3 of keytruda   Visit Diagnosis 1. Encounter for antineoplastic immunotherapy   2. Malignant neoplasm of lower third of esophagus (HCC)   3. Anemia due to antineoplastic chemotherapy      Dr. Randa Evens, MD, MPH [facility] [phone] 07/21/2018 9:22 AM

## 2018-07-23 ENCOUNTER — Other Ambulatory Visit: Payer: Self-pay | Admitting: Oncology

## 2018-07-23 ENCOUNTER — Telehealth: Payer: Self-pay | Admitting: *Deleted

## 2018-07-23 NOTE — Telephone Encounter (Signed)
I called patient to let him know that his hemoglobin earlier in the week was 7.4 but because of the scarce nests of having blood available we have to wait for he is 7 or below to give a blood transfusion.  After Janese Banks suggested he come in and get a dose of Feraheme which will hopefully build up the red blood cells to make hemoglobin faster that he could do it by taking oral iron.  Patient is agreeable to coming in and his appointment is this Friday at 2 PM

## 2018-07-24 ENCOUNTER — Other Ambulatory Visit: Payer: Self-pay

## 2018-07-25 ENCOUNTER — Other Ambulatory Visit: Payer: Self-pay

## 2018-07-25 ENCOUNTER — Inpatient Hospital Stay: Payer: Medicare Other | Attending: Oncology

## 2018-07-25 VITALS — BP 108/66 | HR 60

## 2018-07-25 DIAGNOSIS — C159 Malignant neoplasm of esophagus, unspecified: Secondary | ICD-10-CM

## 2018-07-25 DIAGNOSIS — M545 Low back pain: Secondary | ICD-10-CM | POA: Diagnosis not present

## 2018-07-25 DIAGNOSIS — Z5112 Encounter for antineoplastic immunotherapy: Secondary | ICD-10-CM | POA: Insufficient documentation

## 2018-07-25 DIAGNOSIS — N179 Acute kidney failure, unspecified: Secondary | ICD-10-CM | POA: Insufficient documentation

## 2018-07-25 DIAGNOSIS — D509 Iron deficiency anemia, unspecified: Secondary | ICD-10-CM | POA: Insufficient documentation

## 2018-07-25 DIAGNOSIS — Z5111 Encounter for antineoplastic chemotherapy: Secondary | ICD-10-CM

## 2018-07-25 DIAGNOSIS — C155 Malignant neoplasm of lower third of esophagus: Secondary | ICD-10-CM | POA: Insufficient documentation

## 2018-07-25 MED ORDER — SODIUM CHLORIDE 0.9 % IV SOLN
Freq: Once | INTRAVENOUS | Status: AC
  Administered 2018-07-25: 14:00:00 via INTRAVENOUS
  Filled 2018-07-25: qty 250

## 2018-07-25 MED ORDER — SODIUM CHLORIDE 0.9 % IV SOLN
510.0000 mg | Freq: Once | INTRAVENOUS | Status: AC
  Administered 2018-07-25: 14:00:00 510 mg via INTRAVENOUS
  Filled 2018-07-25: qty 17

## 2018-07-25 MED ORDER — HEPARIN SOD (PORK) LOCK FLUSH 100 UNIT/ML IV SOLN
INTRAVENOUS | Status: AC
  Filled 2018-07-25: qty 5

## 2018-07-25 MED ORDER — SODIUM CHLORIDE 0.9% FLUSH
10.0000 mL | Freq: Once | INTRAVENOUS | Status: AC | PRN
  Administered 2018-07-25: 10 mL
  Filled 2018-07-25: qty 10

## 2018-07-25 MED ORDER — HEPARIN SOD (PORK) LOCK FLUSH 100 UNIT/ML IV SOLN
500.0000 [IU] | Freq: Once | INTRAVENOUS | Status: AC | PRN
  Administered 2018-07-25: 500 [IU]

## 2018-08-09 ENCOUNTER — Other Ambulatory Visit: Payer: Self-pay | Admitting: *Deleted

## 2018-08-09 DIAGNOSIS — C159 Malignant neoplasm of esophagus, unspecified: Secondary | ICD-10-CM

## 2018-08-11 ENCOUNTER — Other Ambulatory Visit: Payer: Self-pay | Admitting: *Deleted

## 2018-08-11 ENCOUNTER — Inpatient Hospital Stay (HOSPITAL_BASED_OUTPATIENT_CLINIC_OR_DEPARTMENT_OTHER): Payer: Medicare Other | Admitting: Oncology

## 2018-08-11 ENCOUNTER — Inpatient Hospital Stay: Payer: Medicare Other

## 2018-08-11 ENCOUNTER — Encounter: Payer: Self-pay | Admitting: Oncology

## 2018-08-11 ENCOUNTER — Other Ambulatory Visit: Payer: Self-pay

## 2018-08-11 VITALS — BP 127/67 | HR 69 | Temp 95.9°F | Resp 18 | Ht 71.0 in | Wt 172.0 lb

## 2018-08-11 DIAGNOSIS — R7989 Other specified abnormal findings of blood chemistry: Secondary | ICD-10-CM

## 2018-08-11 DIAGNOSIS — Z5112 Encounter for antineoplastic immunotherapy: Secondary | ICD-10-CM | POA: Diagnosis not present

## 2018-08-11 DIAGNOSIS — M545 Low back pain: Secondary | ICD-10-CM | POA: Diagnosis not present

## 2018-08-11 DIAGNOSIS — Z5111 Encounter for antineoplastic chemotherapy: Secondary | ICD-10-CM

## 2018-08-11 DIAGNOSIS — D509 Iron deficiency anemia, unspecified: Secondary | ICD-10-CM

## 2018-08-11 DIAGNOSIS — C155 Malignant neoplasm of lower third of esophagus: Secondary | ICD-10-CM | POA: Diagnosis not present

## 2018-08-11 DIAGNOSIS — C159 Malignant neoplasm of esophagus, unspecified: Secondary | ICD-10-CM

## 2018-08-11 DIAGNOSIS — N179 Acute kidney failure, unspecified: Secondary | ICD-10-CM

## 2018-08-11 LAB — COMPREHENSIVE METABOLIC PANEL
ALT: 15 U/L (ref 0–44)
AST: 29 U/L (ref 15–41)
Albumin: 3.6 g/dL (ref 3.5–5.0)
Alkaline Phosphatase: 42 U/L (ref 38–126)
Anion gap: 11 (ref 5–15)
BUN: 29 mg/dL — ABNORMAL HIGH (ref 8–23)
CO2: 22 mmol/L (ref 22–32)
Calcium: 8.8 mg/dL — ABNORMAL LOW (ref 8.9–10.3)
Chloride: 103 mmol/L (ref 98–111)
Creatinine, Ser: 1.33 mg/dL — ABNORMAL HIGH (ref 0.61–1.24)
GFR calc Af Amer: 57 mL/min — ABNORMAL LOW (ref >60)
GFR calc non Af Amer: 49 mL/min — ABNORMAL LOW (ref >60)
Glucose, Bld: 124 mg/dL — ABNORMAL HIGH (ref 70–99)
Potassium: 4.2 mmol/L (ref 3.5–5.1)
Sodium: 136 mmol/L (ref 135–145)
Total Bilirubin: 0.3 mg/dL (ref 0.3–1.2)
Total Protein: 6.8 g/dL (ref 6.5–8.1)

## 2018-08-11 LAB — CBC WITH DIFFERENTIAL/PLATELET
Abs Immature Granulocytes: 0.06 10*3/uL (ref 0.00–0.07)
Basophils Absolute: 0 10*3/uL (ref 0.0–0.1)
Basophils Relative: 0 %
Eosinophils Absolute: 0.1 10*3/uL (ref 0.0–0.5)
Eosinophils Relative: 1 %
HCT: 25.8 % — ABNORMAL LOW (ref 39.0–52.0)
Hemoglobin: 8.1 g/dL — ABNORMAL LOW (ref 13.0–17.0)
Immature Granulocytes: 1 %
Lymphocytes Relative: 10 %
Lymphs Abs: 0.8 10*3/uL (ref 0.7–4.0)
MCH: 32 pg (ref 26.0–34.0)
MCHC: 31.4 g/dL (ref 30.0–36.0)
MCV: 102 fL — ABNORMAL HIGH (ref 80.0–100.0)
Monocytes Absolute: 0.8 10*3/uL (ref 0.1–1.0)
Monocytes Relative: 10 %
Neutro Abs: 6.6 10*3/uL (ref 1.7–7.7)
Neutrophils Relative %: 78 %
Platelets: 231 10*3/uL (ref 150–400)
RBC: 2.53 MIL/uL — ABNORMAL LOW (ref 4.22–5.81)
RDW: 15.3 % (ref 11.5–15.5)
WBC: 8.5 10*3/uL (ref 4.0–10.5)
nRBC: 0 % (ref 0.0–0.2)

## 2018-08-11 MED ORDER — OXYCODONE HCL 5 MG PO TABS: 5.0000 mg | ORAL_TABLET | ORAL | 0 refills | Status: DC | PRN

## 2018-08-11 MED ORDER — SODIUM CHLORIDE 0.9 % IV SOLN
Freq: Once | INTRAVENOUS | Status: AC
  Administered 2018-08-11: 11:00:00 via INTRAVENOUS
  Filled 2018-08-11: qty 250

## 2018-08-11 MED ORDER — SODIUM CHLORIDE 0.9 % IV SOLN
200.0000 mg | Freq: Once | INTRAVENOUS | Status: AC
  Administered 2018-08-11: 12:00:00 200 mg via INTRAVENOUS
  Filled 2018-08-11: qty 8

## 2018-08-11 MED ORDER — SODIUM CHLORIDE 0.9 % IV SOLN
Freq: Once | INTRAVENOUS | Status: AC
  Administered 2018-08-11: 12:00:00 via INTRAVENOUS
  Filled 2018-08-11: qty 250

## 2018-08-11 MED ORDER — SODIUM CHLORIDE 0.9 % IV SOLN
510.0000 mg | Freq: Once | INTRAVENOUS | Status: AC
  Administered 2018-08-11: 11:00:00 510 mg via INTRAVENOUS
  Filled 2018-08-11: qty 17

## 2018-08-11 MED ORDER — HEPARIN SOD (PORK) LOCK FLUSH 100 UNIT/ML IV SOLN
500.0000 [IU] | Freq: Once | INTRAVENOUS | Status: AC | PRN
  Administered 2018-08-11: 500 [IU]

## 2018-08-11 NOTE — Progress Notes (Signed)
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 Mass was nonobstructing, partially obstructing and not circumferential. 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 Angiolymphatic invasion was present. Fundic gland polyp was noted in the stomach which was negative for H. pylori dysplasia and malignancy.  Patient underwent PET/CT scan on 05/28/2017 which showed small hypermetabolic mass in the distal esophagus with an SUV of 10.7. Hypermetabolic left periaortic adenopathy compatible with malignancy. Larger of the 2 involved lymph nodes with a maximum SUV of 16.1. No other regions of tumor are identified.  Tumor marker testing shows he is positive for HER-2 and PDL 1 expressed  Biopsy of the left para-aortic lymph node positive for metastatic esophageal cancer  Patient completed concurrent chemo/RT on 07/24/17. He did miss his last dose of chemotherapy due to neutropenia  Repeat PET/Ct scan showed:IMPRESSION: 1. Today's study demonstrates a positive response to therapy with regression of the primary lesion in the distal esophagus and decreased hypermetabolism in retroperitoneal lymph nodes which are essentially unchanged in size. 2. There is a new focus of  hypermetabolism in the proximal stomach just distal to the gastroesophageal junction. This is of uncertain etiology and significance, and could be physiologic, but correlation with endoscopy may be considered.  MUGA scan showed normal EF  Chemotherapy has been on hold since 10/15/17 due to persistent anemia requiring blood transfusions. He only received 2 cycles.Scans in November 2019 showed progression of adenopathy in the retroperitoneal and gastrohepatic region. Infusional 5-FU and Herceptin was restarted omitting bolus 5-FU and oxaliplatin.He has still been requiring intermittent blood transfusions due to his baseline anemia with a hemoglobin of 10 and a probable component of MDS given his age.  Scans in March showed progression in his adenopathy and patient was switched to second line Keytruda   Interval history- he reports early morning dizziness/light headedness when he wakes up. He needs to rest after simple chores such as brushing his teeth. It takes 3-4 hours before his symptoms resolve. Feels fatigued. He has chronic low back pain and tramadol has not been helping him off late.  ECOG PS- 1 Pain scale- 0 Opioid associated constipation- no  Review of systems- Review of Systems  Constitutional: Positive for malaise/fatigue. Negative for chills, fever and weight loss.  HENT: Negative for congestion, ear discharge and nosebleeds.   Eyes: Negative for blurred vision.  Respiratory: Negative for cough, hemoptysis, sputum production, shortness of breath and wheezing.   Cardiovascular: Negative for chest pain, palpitations, orthopnea and claudication.  Gastrointestinal: Negative for abdominal pain, blood in stool, constipation, diarrhea, heartburn, melena, nausea and vomiting.  Genitourinary: Negative for dysuria, flank pain, frequency, hematuria and urgency.  Musculoskeletal: Positive for back pain. Negative for joint pain and myalgias.  Skin: Negative for rash.  Neurological:  Positive for dizziness. Negative for tingling, focal weakness, seizures, weakness and headaches.  Endo/Heme/Allergies: Does not bruise/bleed  easily.  Psychiatric/Behavioral: Negative for depression and suicidal ideas. The patient does not have insomnia.       Allergies  Allergen Reactions  . Morphine And Related Nausea Only     Past Medical History:  Diagnosis Date  . Anemia   . Arthritis   . Coronary artery disease   . Diverticulitis   . Dysrhythmia   . Esophageal cancer ([place]) 05/2017   Chemo + rad tx's.   Marland Kitchen GERD (gastroesophageal reflux disease)   . Heart murmur   . History of hiatal hernia   . Hyperglycemia   . Hyperlipidemia   . Hypertension   . Hypothyroidism   . Spinal stenosis      Past Surgical History:  Procedure Laterality Date  . BACK SURGERY    . CATARACT EXTRACTION W/PHACO Left 11/08/2014   Procedure: CATARACT EXTRACTION PHACO AND INTRAOCULAR LENS PLACEMENT (IOC);  Surgeon: Estill Cotta, MD;  Location: [facility] ORS;  Service: Ophthalmology;  Laterality: Left;  US:01:20 AP:21.8 CDE:31.09 PAC LOT: [id number]  . CHOLECYSTECTOMY    . ESOPHAGOGASTRODUODENOSCOPY (EGD) WITH PROPOFOL N/A 05/23/2017   Procedure: ESOPHAGOGASTRODUODENOSCOPY (EGD) WITH PROPOFOL;  Surgeon: Lollie Sails, MD;  Location: [facility];  Service: Endoscopy;  Laterality: N/A;  . EYE SURGERY    . IR FLUORO GUIDE PORT INSERTION RIGHT  06/07/2017  . JOINT REPLACEMENT Right    Knee  . ROTATOR CUFF REPAIR Right     Social History   Socioeconomic History  . Marital status: Married    Spouse name: Not on file  . Number of children: Not on file  . Years of education: Not on file  . Highest education level: Not on file  Occupational History  . Not on file  Social Needs  . Financial resource strain: Not on file  . Food insecurity:    Worry: Not on file    Inability: Not on file  . Transportation needs:    Medical: Not on file    Non-medical: Not on file  Tobacco Use  .  Smoking status: Former Smoker    Last attempt to quit: 03/24/1989    Years since quitting: 29.4  . Smokeless tobacco: Never Used  Substance and Sexual Activity  . Alcohol use: No    Frequency: Never  . Drug use: No  . Sexual activity: Not Currently  Lifestyle  . Physical activity:    Days per week: Not on file    Minutes per session: Not on file  . Stress: Not on file  Relationships  . Social connections:    Talks on phone: Not on file    Gets together: Not on file    Attends religious service: Not on file    Active member of club or organization: Not on file    Attends meetings of clubs or organizations: Not on file    Relationship status: Not on file  . Intimate partner violence:    Fear of current or ex partner: Not on file    Emotionally abused: Not on file    Physically abused: Not on file    Forced sexual activity: Not on file  Other Topics Concern  . Not on file  Social History Narrative  . Not on file    Family History  Problem Relation Age of Onset  . Colon cancer Mother      Current Outpatient Medications:  .  acetaminophen (TYLENOL) 650 MG CR tablet, Take 650 mg by mouth every 8 (eight) hours as needed for pain.,  Disp: , Rfl:  .  ascorbic acid (VITAMIN C) 1000 MG tablet, Take 2,000 mg by mouth daily. , Disp: , Rfl:  .  docusate sodium (COLACE) 100 MG capsule, Take 100 mg by mouth daily., Disp: , Rfl:  .  levothyroxine (SYNTHROID, LEVOTHROID) 25 MCG tablet, Take 25 mcg by mouth daily before breakfast., Disp: , Rfl:  .  lidocaine-prilocaine (EMLA) cream, Place small amt over port site 1 hour before each treatment, cover with saran wrap to protect clothing, Disp: 30 g, Rfl: 3 .  metoprolol tartrate (LOPRESSOR) 50 MG tablet, Take 50 mg by mouth 2 (two) times daily., Disp: , Rfl:  .  omeprazole (PRILOSEC) 20 MG capsule, Take 20 mg by mouth daily., Disp: , Rfl:  .  pravastatin (PRAVACHOL) 20 MG tablet, Take 20 mg by mouth daily., Disp: , Rfl:  .  predniSONE  (DELTASONE) 5 MG tablet, Take 5 mg by mouth daily with breakfast., Disp: , Rfl:  .  Probiotic Product (PROBIOTIC COMPLEX ACIDOPHILUS PO), Take 1 capsule by mouth daily. , Disp: , Rfl:  .  quinapril (ACCUPRIL) 40 MG tablet, Take 40 mg by mouth 1 day or 1 dose., Disp: , Rfl:  .  tamsulosin (FLOMAX) 0.4 MG CAPS capsule, Take 0.4 mg by mouth daily., Disp: , Rfl:  .  traMADol (ULTRAM-ER) 100 MG 24 hr tablet, Take 50 mg by mouth 2 (two) times daily. With lunch and dinner , Disp: , Rfl:  .  traMADol (ULTRAM-ER) 300 MG 24 hr tablet, Take 300 mg by mouth daily. AM dose, Disp: , Rfl:   Physical exam:  Vitals:   08/11/18 0938  BP: 127/67  Pulse: 69  Resp: 18  Temp: (!) 95.9 F (35.5 C)  TempSrc: Tympanic  Weight: 172 lb (78 kg)  Height: '5\' 11"'  (1.803 m)   Physical Exam Constitutional:      General: He is not in acute distress. HENT:     Head: Normocephalic and atraumatic.  Eyes:     Pupils: Pupils are equal, round, and reactive to light.  Neck:     Musculoskeletal: Normal range of motion.  Cardiovascular:     Rate and Rhythm: Normal rate and regular rhythm.     Heart sounds: Normal heart sounds.     Comments: Systolic murmur + Pulmonary:     Effort: Pulmonary effort is normal.     Breath sounds: Normal breath sounds.  Abdominal:     General: Bowel sounds are normal.     Palpations: Abdomen is soft.  Skin:    General: Skin is warm and dry.  Neurological:     Mental Status: He is alert and oriented to person, place, and time.      CMP Latest Ref Rng & Units 07/21/2018  Glucose 70 - 99 mg/dL 159(H)  BUN 8 - 23 mg/dL 36(H)  Creatinine 0.61 - 1.24 mg/dL 1.34(H)  Sodium 135 - 145 mmol/L 135  Potassium 3.5 - 5.1 mmol/L 3.8  Chloride 98 - 111 mmol/L 105  CO2 22 - 32 mmol/L 22  Calcium 8.9 - 10.3 mg/dL 8.8(L)  Total Protein 6.5 - 8.1 g/dL 6.7  Total Bilirubin 0.3 - 1.2 mg/dL 0.3  Alkaline Phos 38 - 126 U/L 47  AST 15 - 41 U/L 24  ALT 0 - 44 U/L 12   CBC Latest Ref Rng & Units  07/21/2018  WBC 4.0 - 10.5 K/uL 7.8  Hemoglobin 13.0 - 17.0 g/dL 7.4(L)  Hematocrit 39.0 - 52.0 % 22.4(L)  Platelets  150 - 400 K/uL 221      Assessment and plan- Patient is a 85 y.o. male with stage IV esophageal adenocarcinomacTX N0M1with metastases to the para-aortic lymph nodes/p concurrent chemo/RT with carbo/taxol.Patient then received 2 cycles of palliative FOLFOX chemotherapy but his chemo wason hold since June 2019 due to chemo induced anemia requiring blood transfusions and interruption in the treatmentand was restarted in November 29 when he had evidence of disease progression.He is s/p 6 cycles of infusional 5-FU chemotherapy with Herceptin.  He is here for on treatment assessment prior to cycle 3 of Keytruda  Suspect early morning lightheadedness is secondary to orthostatic hypotension. Unlikely due to [place]. If he has persistent symptoms- will check cortisol level  Counts ok to proceed with cycle 3 of keytruda today. rtc in 3 weeks. Labs: cbc with diff/ cmp. See MD  Iron deficiency anemia: second dose of feraheme today  Low back pain: I have advised him to stop tramadol and I will give him oxycodone 5 mg PO Q4 prn for his pain. Also advied him to come off prednisone if possible since it has a counterintuitive effect to [place]- stable around 1.3. will give 1L IVf today      Visit Diagnosis 1. Malignant neoplasm of lower third of esophagus (HCC)   2. Encounter for antineoplastic immunotherapy      Dr. Randa Evens, MD, MPH [facility] [phone] 08/11/2018 8:48 AM

## 2018-08-12 LAB — CEA: CEA: 298 ng/mL — ABNORMAL HIGH (ref 0.0–4.7)

## 2018-08-15 ENCOUNTER — Telehealth: Payer: Self-pay | Admitting: *Deleted

## 2018-08-15 NOTE — Telephone Encounter (Signed)
Patient called stating that his pain is not really controlled on oxycodone. He wanted toknow what to do . Should he inc. The dose or switch to something else. I spoke with Janese Banks and she said that he can take 2 tablets at a time. He says that it comes and goes. It hurt one time after swallowing his pills. Several times he hurt after eating meal. Some meals do not bother him. He is not laying down after meals. He says that it is combination or chest and back. He is having some phlegm but it is clear. He eats soft foods and if it is meat he knows to cut it up in tiny pieces and chew completely before swallowing it.he will call us if the inc. Does not help.

## 2018-08-21 ENCOUNTER — Other Ambulatory Visit: Payer: Self-pay | Admitting: *Deleted

## 2018-08-21 NOTE — Telephone Encounter (Signed)
Pt using 2 5 mg tablets every 4 hours as Dr.rao told him he could because the 1 tablet did not work for pain. He is taking them every 4hours with food. He does not know when the pain is coming. It just hits him out of the blue. Dr. Janese Banks states that we can refill the med with 10 mg tablets and give 30 day supply

## 2018-08-22 MED ORDER — OXYCODONE HCL 10 MG PO TABS: 10.0000 mg | ORAL_TABLET | ORAL | 0 refills | Status: AC | PRN

## 2018-09-01 ENCOUNTER — Inpatient Hospital Stay: Payer: Medicare Other | Attending: Oncology | Admitting: Oncology

## 2018-09-01 ENCOUNTER — Other Ambulatory Visit: Payer: Self-pay

## 2018-09-01 ENCOUNTER — Encounter: Payer: Self-pay | Admitting: Oncology

## 2018-09-01 ENCOUNTER — Inpatient Hospital Stay: Payer: Medicare Other

## 2018-09-01 VITALS — BP 94/56 | HR 97 | Temp 96.0°F | Resp 18 | Wt 169.2 lb

## 2018-09-01 DIAGNOSIS — G893 Neoplasm related pain (acute) (chronic): Secondary | ICD-10-CM | POA: Diagnosis not present

## 2018-09-01 DIAGNOSIS — K5903 Drug induced constipation: Secondary | ICD-10-CM | POA: Diagnosis not present

## 2018-09-01 DIAGNOSIS — Z9221 Personal history of antineoplastic chemotherapy: Secondary | ICD-10-CM | POA: Diagnosis not present

## 2018-09-01 DIAGNOSIS — Z79899 Other long term (current) drug therapy: Secondary | ICD-10-CM | POA: Diagnosis not present

## 2018-09-01 DIAGNOSIS — C772 Secondary and unspecified malignant neoplasm of intra-abdominal lymph nodes: Secondary | ICD-10-CM

## 2018-09-01 DIAGNOSIS — T402X5A Adverse effect of other opioids, initial encounter: Secondary | ICD-10-CM | POA: Insufficient documentation

## 2018-09-01 DIAGNOSIS — C155 Malignant neoplasm of lower third of esophagus: Secondary | ICD-10-CM | POA: Insufficient documentation

## 2018-09-01 DIAGNOSIS — E039 Hypothyroidism, unspecified: Secondary | ICD-10-CM

## 2018-09-01 DIAGNOSIS — C159 Malignant neoplasm of esophagus, unspecified: Secondary | ICD-10-CM

## 2018-09-01 DIAGNOSIS — Z923 Personal history of irradiation: Secondary | ICD-10-CM

## 2018-09-01 DIAGNOSIS — Z7189 Other specified counseling: Secondary | ICD-10-CM

## 2018-09-01 DIAGNOSIS — E785 Hyperlipidemia, unspecified: Secondary | ICD-10-CM | POA: Diagnosis not present

## 2018-09-01 DIAGNOSIS — I10 Essential (primary) hypertension: Secondary | ICD-10-CM | POA: Insufficient documentation

## 2018-09-01 DIAGNOSIS — Z87891 Personal history of nicotine dependence: Secondary | ICD-10-CM | POA: Diagnosis not present

## 2018-09-01 LAB — CBC WITH DIFFERENTIAL/PLATELET
Abs Immature Granulocytes: 0.04 10*3/uL (ref 0.00–0.07)
Basophils Absolute: 0 10*3/uL (ref 0.0–0.1)
Basophils Relative: 0 %
Eosinophils Absolute: 0.1 10*3/uL (ref 0.0–0.5)
Eosinophils Relative: 1 %
HCT: 24.6 % — ABNORMAL LOW (ref 39.0–52.0)
Hemoglobin: 7.9 g/dL — ABNORMAL LOW (ref 13.0–17.0)
Immature Granulocytes: 1 %
Lymphocytes Relative: 11 %
Lymphs Abs: 0.8 10*3/uL (ref 0.7–4.0)
MCH: 32.1 pg (ref 26.0–34.0)
MCHC: 32.1 g/dL (ref 30.0–36.0)
MCV: 100 fL (ref 80.0–100.0)
Monocytes Absolute: 0.7 10*3/uL (ref 0.1–1.0)
Monocytes Relative: 11 %
Neutro Abs: 5.3 10*3/uL (ref 1.7–7.7)
Neutrophils Relative %: 76 %
Platelets: 233 10*3/uL (ref 150–400)
RBC: 2.46 MIL/uL — ABNORMAL LOW (ref 4.22–5.81)
RDW: 14.4 % (ref 11.5–15.5)
WBC: 7 10*3/uL (ref 4.0–10.5)
nRBC: 0 % (ref 0.0–0.2)

## 2018-09-01 LAB — COMPREHENSIVE METABOLIC PANEL
ALT: 11 U/L (ref 0–44)
AST: 29 U/L (ref 15–41)
Albumin: 3.3 g/dL — ABNORMAL LOW (ref 3.5–5.0)
Alkaline Phosphatase: 47 U/L (ref 38–126)
Anion gap: 11 (ref 5–15)
BUN: 25 mg/dL — ABNORMAL HIGH (ref 8–23)
CO2: 23 mmol/L (ref 22–32)
Calcium: 8.9 mg/dL (ref 8.9–10.3)
Chloride: 104 mmol/L (ref 98–111)
Creatinine, Ser: 1.31 mg/dL — ABNORMAL HIGH (ref 0.61–1.24)
GFR calc Af Amer: 58 mL/min — ABNORMAL LOW (ref >60)
GFR calc non Af Amer: 50 mL/min — ABNORMAL LOW (ref >60)
Glucose, Bld: 135 mg/dL — ABNORMAL HIGH (ref 70–99)
Potassium: 4 mmol/L (ref 3.5–5.1)
Sodium: 138 mmol/L (ref 135–145)
Total Bilirubin: 0.3 mg/dL (ref 0.3–1.2)
Total Protein: 6.6 g/dL (ref 6.5–8.1)

## 2018-09-01 MED ORDER — FENTANYL 12 MCG/HR TD PT72: 1.0000 | MEDICATED_PATCH | TRANSDERMAL | 0 refills | Status: DC

## 2018-09-01 MED ORDER — HEPARIN SOD (PORK) LOCK FLUSH 100 UNIT/ML IV SOLN
500.0000 [IU] | Freq: Once | INTRAVENOUS | Status: AC
  Administered 2018-09-01: 15:00:00 500 [IU] via INTRAVENOUS

## 2018-09-01 MED ORDER — SODIUM CHLORIDE 0.9 % IV SOLN
INTRAVENOUS | Status: AC
  Administered 2018-09-01: 14:00:00 via INTRAVENOUS
  Filled 2018-09-01: qty 250

## 2018-09-01 MED ORDER — MORPHINE SULFATE 2 MG/ML IJ SOLN
5.0000 mg | Freq: Once | INTRAMUSCULAR | Status: AC
  Administered 2018-09-01: 14:00:00 5 mg via INTRAVENOUS
  Filled 2018-09-01: qty 3

## 2018-09-01 MED ORDER — SODIUM CHLORIDE 0.9% FLUSH
10.0000 mL | INTRAVENOUS | Status: DC | PRN
  Administered 2018-09-01: 12:00:00 10 mL via INTRAVENOUS
  Filled 2018-09-01: qty 10

## 2018-09-01 NOTE — Progress Notes (Signed)
Patient here for follow up. Pt complains of mild nausea.

## 2018-09-01 NOTE — Progress Notes (Signed)
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 Mass was nonobstructing, partially obstructing and not circumferential. 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 Angiolymphatic invasion was present. Fundic gland polyp was noted in the stomach which was negative for H. pylori dysplasia and malignancy.  Patient underwent PET/CT scan on 05/28/2017 which showed small hypermetabolic mass in the distal esophagus with an SUV of 10.7. Hypermetabolic left periaortic adenopathy compatible with malignancy. Larger of the 2 involved lymph nodes with a maximum SUV of 16.1. No other regions of tumor are identified.  Tumor marker testing shows he is positive for HER-2 and PDL 1 expressed  Biopsy of the left para-aortic lymph node positive for metastatic esophageal cancer  Patient completed concurrent chemo/RT on 07/24/17. He did miss his last dose of chemotherapy due to neutropenia  Repeat PET/Ct scan showed:IMPRESSION: 1. Today's study demonstrates a positive response to therapy with regression of the primary lesion in the distal esophagus and decreased hypermetabolism in retroperitoneal lymph nodes which are essentially unchanged in size. 2. There is a new focus of  hypermetabolism in the proximal stomach just distal to the gastroesophageal junction. This is of uncertain etiology and significance, and could be physiologic, but correlation with endoscopy may be considered.  MUGA scan showed normal EF  Chemotherapy has been on hold since 10/15/17 due to persistent anemia requiring blood transfusions. He only received 2 cycles.Scans in November 2019 showed progression of adenopathy in the retroperitoneal and gastrohepatic region. Infusional 5-FU and Herceptin was restarted omitting bolus 5-FU and oxaliplatin.He has still been requiring intermittent blood transfusions due to his baseline anemia with a hemoglobin of 10 and a probable component of MDS given his age.  Scans in March showed progression in his adenopathy and patient was switched to second line Keytruda  Interval history-he has not been feeling well over the last couple of months since she started taking Keytruda.  Feels extremely fatigued and winded even when he walks a short distance.  Reports retrosternal chest pain which is at times worse when he swallows.  Also reports pain between his shoulder blades and pain that radiates up his neck.  He has been taking as needed oxycodone 10 mg but has not been helping his pain much.  He continues to be able to swallow soft foods without much difficulty other than occasional pain.  But admits to difficulty swallowing that makes him limit his oral intake.  His weight is down by 3 pounds as compared to last visit.  He takes as needed MiraLAX and senna but continues to feel constipated.  Reports tingling numbness in 2 of his fingers of the right upper extremity  ECOG PS- 2 Pain scale- 6 Opioid associated constipation-.  yes  Review of systems- Review of Systems  Constitutional: Positive for malaise/fatigue. Negative for chills, fever and weight loss.  HENT: Negative for congestion,  ear discharge and nosebleeds.   Eyes: Negative for blurred vision.    Respiratory: Negative for cough, hemoptysis, sputum production, shortness of breath and wheezing.   Cardiovascular: Negative for chest pain, palpitations, orthopnea and claudication.  Gastrointestinal: Positive for constipation. Negative for abdominal pain, blood in stool, diarrhea, heartburn, melena, nausea and vomiting.  Genitourinary: Negative for dysuria, flank pain, frequency, hematuria and urgency.       Retrosternal chest pain  Musculoskeletal: Positive for back pain. Negative for joint pain and myalgias.  Skin: Negative for rash.  Neurological: Negative for dizziness, tingling, focal weakness, seizures, weakness and headaches.  Endo/Heme/Allergies: Does not bruise/bleed easily.  Psychiatric/Behavioral: Negative for depression and suicidal ideas. The patient does not have insomnia.        Allergies  Allergen Reactions   Morphine And Related Nausea Only     Past Medical History:  Diagnosis Date   Anemia    Arthritis    Coronary artery disease    Diverticulitis    Dysrhythmia    Esophageal cancer ([place]) 05/2017   Chemo + rad tx's.    GERD (gastroesophageal reflux disease)    Heart murmur    History of hiatal hernia    Hyperglycemia    Hyperlipidemia    Hypertension    Hypothyroidism    Spinal stenosis      Past Surgical History:  Procedure Laterality Date   BACK SURGERY     CATARACT EXTRACTION W/PHACO Left 11/08/2014   Procedure: CATARACT EXTRACTION PHACO AND INTRAOCULAR LENS PLACEMENT ([place]);  Surgeon: Estill Cotta, MD;  Location: [facility] ORS;  Service: Ophthalmology;  Laterality: Left;  US:01:20 AP:21.8 CDE:31.09 PAC LOT: [id number]   CHOLECYSTECTOMY     ESOPHAGOGASTRODUODENOSCOPY (EGD) WITH PROPOFOL N/A 05/23/2017   Procedure: ESOPHAGOGASTRODUODENOSCOPY (EGD) WITH PROPOFOL;  Surgeon: Lollie Sails, MD;  Location: [facility] ENDOSCOPY;  Service: Endoscopy;  Laterality: N/A;   EYE SURGERY     IR FLUORO GUIDE PORT INSERTION RIGHT  06/07/2017    JOINT REPLACEMENT Right    Knee   ROTATOR CUFF REPAIR Right     Social History   Socioeconomic History   Marital status: Married    Spouse name: Not on file   Number of children: Not on file   Years of education: Not on file   Highest education level: Not on file  Occupational History   Not on file  Social Needs   Financial resource strain: Not on file   Food insecurity:    Worry: Not on file    Inability: Not on file   Transportation needs:    Medical: Not on file    Non-medical: Not on file  Tobacco Use   Smoking status: Former Smoker    Last attempt to quit: 03/24/1989    Years since quitting: 29.4   Smokeless tobacco: Never Used  Substance and Sexual Activity   Alcohol use: No    Frequency: Never   Drug use: No   Sexual activity: Not Currently  Lifestyle   Physical activity:    Days per week: Not on file    Minutes per session: Not on file   Stress: Not on file  Relationships   Social connections:    Talks on phone: Not on file    Gets together: Not on file    Attends religious service: Not on file    Active member of club or organization: Not on file    Attends meetings of clubs or organizations: Not on file    Relationship  status: Not on file   Intimate partner violence:    Fear of current or ex partner: Not on file    Emotionally abused: Not on file    Physically abused: Not on file    Forced sexual activity: Not on file  Other Topics Concern   Not on file  Social History Narrative   Not on file    Family History  Problem Relation Age of Onset   Colon cancer Mother      Current Outpatient Medications:    acetaminophen (TYLENOL) 650 MG CR tablet, Take 650 mg by mouth every 8 (eight) hours as needed for pain., Disp: , Rfl:    ascorbic acid (VITAMIN C) 1000 MG tablet, Take 2,000 mg by mouth daily. , Disp: , Rfl:    docusate sodium (COLACE) 100 MG capsule, Take 100 mg by mouth daily., Disp: , Rfl:    levothyroxine  (SYNTHROID, LEVOTHROID) 25 MCG tablet, Take 25 mcg by mouth daily before breakfast., Disp: , Rfl:    lidocaine-prilocaine (EMLA) cream, Place small amt over port site 1 hour before each treatment, cover with saran wrap to protect clothing, Disp: 30 g, Rfl: 3   metoprolol tartrate (LOPRESSOR) 50 MG tablet, Take 50 mg by mouth 2 (two) times daily., Disp: , Rfl:    omeprazole (PRILOSEC) 20 MG capsule, Take 20 mg by mouth daily., Disp: , Rfl:    Oxycodone HCl 10 MG TABS, Take 1 tablet (10 mg total) by mouth every 4 (four) hours as needed., Disp: 180 tablet, Rfl: 0   pravastatin (PRAVACHOL) 20 MG tablet, Take 20 mg by mouth daily., Disp: , Rfl:    Probiotic Product (PROBIOTIC COMPLEX ACIDOPHILUS PO), Take 1 capsule by mouth daily. , Disp: , Rfl:    quinapril (ACCUPRIL) 40 MG tablet, Take 40 mg by mouth 1 day or 1 dose., Disp: , Rfl:    tamsulosin (FLOMAX) 0.4 MG CAPS capsule, Take 0.8 mg by mouth daily. , Disp: , Rfl:    fentaNYL (DURAGESIC) 12 MCG/HR, Place 1 patch onto the skin every 3 (three) days for 30 days., Disp: 10 patch, Rfl: 0   predniSONE (DELTASONE) 5 MG tablet, Take 10 mg by mouth daily with breakfast. , Disp: , Rfl:  No current facility-administered medications for this visit.   Facility-Administered Medications Ordered in Other Visits:    0.9 %  sodium chloride infusion, , Intravenous, Continuous, Sindy Guadeloupe, MD   heparin lock flush 100 unit/mL, 500 Units, Intravenous, Once, Sindy Guadeloupe, MD   morphine 2 MG/ML injection 5 mg, 5 mg, Intravenous, Once, Sindy Guadeloupe, MD   sodium chloride flush (NS) 0.9 % injection 10 mL, 10 mL, Intravenous, PRN, Sindy Guadeloupe, MD, 10 mL at 09/01/18 1158  Physical exam:  Vitals:   09/01/18 1217  BP: (!) 94/56  Pulse: 97  Resp: 18  Temp: (!) 96 F (35.6 C)  TempSrc: Tympanic  Weight: 169 lb 3.2 oz (76.7 kg)   Physical Exam Constitutional:      General: He is not in acute distress.    Comments: Appears fatigued.  Ambulates  with a walker  HENT:     Head: Normocephalic and atraumatic.  Eyes:     Pupils: Pupils are equal, round, and reactive to light.  Neck:     Musculoskeletal: Normal range of motion.  Cardiovascular:     Rate and Rhythm: Normal rate and regular rhythm.     Heart sounds: Normal heart sounds.  Pulmonary:  Effort: Pulmonary effort is normal.     Breath sounds: Normal breath sounds.  Abdominal:     General: Bowel sounds are normal.     Palpations: Abdomen is soft.  Skin:    General: Skin is warm and dry.  Neurological:     Mental Status: He is alert and oriented to person, place, and time.      CMP Latest Ref Rng & Units 09/01/2018  Glucose 70 - 99 mg/dL 135(H)  BUN 8 - 23 mg/dL 25(H)  Creatinine 0.61 - 1.24 mg/dL 1.31(H)  Sodium 135 - 145 mmol/L 138  Potassium 3.5 - 5.1 mmol/L 4.0  Chloride 98 - 111 mmol/L 104  CO2 22 - 32 mmol/L 23  Calcium 8.9 - 10.3 mg/dL 8.9  Total Protein 6.5 - 8.1 g/dL 6.6  Total Bilirubin 0.3 - 1.2 mg/dL 0.3  Alkaline Phos 38 - 126 U/L 47  AST 15 - 41 U/L 29  ALT 0 - 44 U/L 11   CBC Latest Ref Rng & Units 09/01/2018  WBC 4.0 - 10.5 K/uL 7.0  Hemoglobin 13.0 - 17.0 g/dL 7.9(L)  Hematocrit 39.0 - 52.0 % 24.6(L)  Platelets 150 - 400 K/uL 233     Assessment and plan- Patient is a 85 y.o. male with stage IV esophageal adenocarcinomacTX N0M1with metastases to the para-aortic lymph nodes/p concurrent chemo/RT with carbo/taxol. Patient subsequently had disease progression on FOLFOX and is currently on second line Keytruda  1.  Patient continues to have significant fatigue.  Despite not receiving chemotherapy for the last 3 months his hemoglobin continues to remain between 7-8 from his baseline of 10.  Iron studies in March 2020 revealed iron deficiency and will give him 2 doses of Feraheme in early April with no significant improvement in his hemoglobin.  At this point I will hold his Keytruda today.  2.  He is hypotensive today with a systolic blood  pressure of 92.  I will give him 1 L of IV fluids today and have asked him to keep a tab of his blood pressure at home.  If his blood pressure continues to trend in the 100 he should hold off on taking his antihypertensives.  3.  I am concerned that his symptoms of back pain and dysphagia are secondary to disease progression.  CEA has considerably gone up to 298 from a prior value of 9.6.  CEA from today is pending.  I will plan to get repeat CT chest abdomen and pelvis with contrast this week.  I will set up a video visit with him next week to discuss further goals of care.  If scans show disease progression we are looking at hospice versus trial of Cyramza and Taxol which he may not be able to tolerate given his age frailty and ongoing anemia.  4.  Neoplasm related pain: Again I suspect his dysphagia and back pain secondary to esophageal cancer.  He will continue taking PRN oxycodone I will prescribe him 12 mcg of fentanyl patch.  I will see if there is any concern for luminal obstruction based on the CT chest as well.  CT chest will also be evaluating him for possible PE.  I will give him 5 mg IV morphine today.  Unclear if peripheral neuropathy due to prior chemotherapy or potential spinal metastases which will also be evaluated on CT chest.  He is due for repeat scans in 2 days time  5.  Opioid-induced constipation: I have asked him to take MiraLAX once daily  and increase his Senokot to twice at night.  I will reassess his symptoms next week    Visit Diagnosis 1. Goals of care, counseling/discussion   2. Malignant neoplasm of lower third of esophagus (HCC)   3. Neoplasm related pain   4. Therapeutic opioid induced constipation      Dr. Randa Evens, MD, MPH [facility] at [facility] [phone] 09/01/2018 1:07 PM

## 2018-09-02 LAB — CEA: CEA: 530 ng/mL — ABNORMAL HIGH (ref 0.0–4.7)

## 2018-09-03 ENCOUNTER — Ambulatory Visit: Payer: Medicare Other

## 2018-09-04 ENCOUNTER — Ambulatory Visit: Payer: Medicare Other | Admitting: Radiation Oncology

## 2018-09-05 ENCOUNTER — Telehealth: Payer: Self-pay | Admitting: Oncology

## 2018-09-05 ENCOUNTER — Ambulatory Visit
Admission: RE | Admit: 2018-09-05 | Discharge: 2018-09-05 | Disposition: A | Discharge status: Home / Self Care | Payer: Medicare Other | Source: Ambulatory Visit | Attending: Oncology | Admitting: Oncology

## 2018-09-05 ENCOUNTER — Other Ambulatory Visit: Payer: Self-pay

## 2018-09-05 ENCOUNTER — Ambulatory Visit: Admission: RE | Admit: 2018-09-05 | Payer: Medicare Other | Source: Ambulatory Visit

## 2018-09-05 DIAGNOSIS — C155 Malignant neoplasm of lower third of esophagus: Secondary | ICD-10-CM | POA: Diagnosis not present

## 2018-09-05 MED ORDER — IOHEXOL 300 MG/ML  SOLN
80.0000 mL | Freq: Once | INTRAMUSCULAR | Status: AC | PRN
  Administered 2018-09-05: 80 mL via INTRAVENOUS

## 2018-09-08 ENCOUNTER — Encounter: Payer: Self-pay | Admitting: Oncology

## 2018-09-08 ENCOUNTER — Inpatient Hospital Stay (HOSPITAL_BASED_OUTPATIENT_CLINIC_OR_DEPARTMENT_OTHER): Payer: Medicare Other | Admitting: Oncology

## 2018-09-08 DIAGNOSIS — C155 Malignant neoplasm of lower third of esophagus: Secondary | ICD-10-CM | POA: Diagnosis not present

## 2018-09-08 DIAGNOSIS — Z7189 Other specified counseling: Secondary | ICD-10-CM

## 2018-09-08 DIAGNOSIS — R935 Abnormal findings on diagnostic imaging of other abdominal regions, including retroperitoneum: Secondary | ICD-10-CM

## 2018-09-08 DIAGNOSIS — R1310 Dysphagia, unspecified: Secondary | ICD-10-CM

## 2018-09-08 MED ORDER — OMEPRAZOLE 40 MG PO CPDR: 40.0000 mg | DELAYED_RELEASE_CAPSULE | Freq: Two times a day (BID) | ORAL | 0 refills | Status: DC

## 2018-09-11 ENCOUNTER — Other Ambulatory Visit: Payer: Self-pay | Admitting: *Deleted

## 2018-09-11 ENCOUNTER — Telehealth: Payer: Self-pay | Admitting: *Deleted

## 2018-09-11 ENCOUNTER — Encounter: Payer: Self-pay | Admitting: Oncology

## 2018-09-11 DIAGNOSIS — G893 Neoplasm related pain (acute) (chronic): Secondary | ICD-10-CM

## 2018-09-11 DIAGNOSIS — C155 Malignant neoplasm of lower third of esophagus: Secondary | ICD-10-CM

## 2018-09-11 NOTE — Progress Notes (Signed)
I connected with Eddie Castaneda on 09/11/18 at  1:15 PM EDT by video enabled telemedicine visit and verified that I am speaking with the correct person using two identifiers.   I discussed the limitations, risks, security and privacy concerns of performing an evaluation and management service by telemedicine and the availability of in-person appointments. I also discussed with the patient that there may be a patient responsible charge related to this service. The patient expressed understanding and agreed to proceed.  Other persons participating in the visit and their role in the encounter:  Patients daughter  Patient's location:  home Provider's location:  home  Chief Complaint:  Discuss ct scan results and further management  History of present illness: patient is a 85 year old malewhounderwent an upper endoscopy in January 2019 which showed a medium-sized fungating mass with no bleeding found in the distal esophagus 35-37 cm from the incisors. Mass was nonobstructing, partially obstructing and not circumferential. 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 Angiolymphatic invasion was present. Fundic gland polyp was noted in the stomach which was negative for H. pylori dysplasia and malignancy.  Patient underwent PET/CT scan on 05/28/2017 which showed small hypermetabolic mass in the distal esophagus with an SUV of 10.7. Hypermetabolic left periaortic adenopathy compatible with malignancy. Larger of the 2 involved lymph nodes with a maximum SUV of 16.1. No other regions of tumor are identified.  Tumor marker testing shows he is positive for HER-2 and PDL 1 expressed  Biopsy of the left para-aortic lymph node positive for metastatic esophageal cancer  Patient completed concurrent chemo/RT on 07/24/17. He did miss his last dose of chemotherapy due to neutropenia  Repeat PET/Ct scan showed:IMPRESSION: 1. Today's study demonstrates a  positive response to therapy with regression of the primary lesion in the distal esophagus and decreased hypermetabolism in retroperitoneal lymph nodes which are essentially unchanged in size. 2. There is a new focus of hypermetabolism in the proximal stomach just distal to the gastroesophageal junction. This is of uncertain etiology and significance, and could be physiologic, but correlation with endoscopy may be considered.  MUGA scan showed normal EF  Chemotherapy has been on hold since 10/15/17 due to persistent anemia requiring blood transfusions. He only received 2 cycles.Scans in November 2019 showed progression of adenopathy in the retroperitoneal and gastrohepatic region. Infusional 5-FU and Herceptin was restarted omitting bolus 5-FU and oxaliplatin.He has still been requiring intermittent blood transfusions due to his baseline anemia with a hemoglobin of 10 and a probable component of MDS given his age.Scans in March showed progression in his adenopathy and patient was switched to second line Keytruda  Interval history still has retrosternal chest pain when he swalllows or moves around. 12 mcg fentanyl patch has not been helping much. Also complains of mucus and post nasal drip which makes him nauseous and he feels like throwing up after that   Review of Systems  Constitutional: Positive for malaise/fatigue and weight loss. Negative for chills and fever.  HENT: Positive for congestion. Negative for ear discharge and nosebleeds.   Eyes: Negative for blurred vision.  Respiratory: Negative for cough, hemoptysis, sputum production, shortness of breath and wheezing.   Cardiovascular: Negative for chest pain, palpitations, orthopnea and claudication.  Gastrointestinal: Negative for abdominal pain, blood in stool, constipation, diarrhea, heartburn, melena, nausea and vomiting.       Dysphagia  Genitourinary: Negative for dysuria, flank pain, frequency, hematuria and urgency.   Musculoskeletal: Negative for back pain, joint pain and myalgias.  Skin: Negative for rash.  Neurological: Negative for dizziness, tingling, focal weakness, seizures, weakness and headaches.  Endo/Heme/Allergies: Does not bruise/bleed easily.  Psychiatric/Behavioral: Negative for depression and suicidal ideas. The patient does not have insomnia.     Allergies  Allergen Reactions  . Morphine And Related Nausea Only    In large amount with pain pump when he had his knee replacement several years    Past Medical History:  Diagnosis Date  . Anemia   . Arthritis   . Coronary artery disease   . Diverticulitis   . Dysrhythmia   . Esophageal cancer ([place]) 05/2017   Chemo + rad tx's.   Marland Kitchen GERD (gastroesophageal reflux disease)   . Heart murmur   . History of hiatal hernia   . Hyperglycemia   . Hyperlipidemia   . Hypertension   . Hypothyroidism   . Spinal stenosis     Past Surgical History:  Procedure Laterality Date  . BACK SURGERY    . CATARACT EXTRACTION W/PHACO Left 11/08/2014   Procedure: CATARACT EXTRACTION PHACO AND INTRAOCULAR LENS PLACEMENT (IOC);  Surgeon: Estill Cotta, MD;  Location: [facility] ORS;  Service: Ophthalmology;  Laterality: Left;  US:01:20 AP:21.8 CDE:31.09 PAC LOT: [id number]  . CHOLECYSTECTOMY    . ESOPHAGOGASTRODUODENOSCOPY (EGD) WITH PROPOFOL N/A 05/23/2017   Procedure: ESOPHAGOGASTRODUODENOSCOPY (EGD) WITH PROPOFOL;  Surgeon: Lollie Sails, MD;  Location: [facility] ENDOSCOPY;  Service: Endoscopy;  Laterality: N/A;  . EYE SURGERY    . IR FLUORO GUIDE PORT INSERTION RIGHT  06/07/2017  . JOINT REPLACEMENT Right    Knee  . ROTATOR CUFF REPAIR Right     Social History   Socioeconomic History  . Marital status: Married    Spouse name: Not on file  . Number of children: Not on file  . Years of education: Not on file  . Highest education level: Not on file  Occupational History  . Not on file  Social Needs  . Financial resource strain: Not on file   . Food insecurity:    Worry: Not on file    Inability: Not on file  . Transportation needs:    Medical: Not on file    Non-medical: Not on file  Tobacco Use  . Smoking status: Former Smoker    Last attempt to quit: 03/24/1989    Years since quitting: 29.4  . Smokeless tobacco: Never Used  Substance and Sexual Activity  . Alcohol use: No    Frequency: Never  . Drug use: No  . Sexual activity: Not Currently  Lifestyle  . Physical activity:    Days per week: Not on file    Minutes per session: Not on file  . Stress: Not on file  Relationships  . Social connections:    Talks on phone: Not on file    Gets together: Not on file    Attends religious service: Not on file    Active member of club or organization: Not on file    Attends meetings of clubs or organizations: Not on file    Relationship status: Not on file  . Intimate partner violence:    Fear of current or ex partner: Not on file    Emotionally abused: Not on file    Physically abused: Not on file    Forced sexual activity: Not on file  Other Topics Concern  . Not on file  Social History Narrative  . Not on file    Family History  Problem Relation Age of Onset  .  Colon cancer Mother      Current Outpatient Medications:  .  acetaminophen (TYLENOL) 650 MG CR tablet, Take 650 mg by mouth every 8 (eight) hours as needed for pain., Disp: , Rfl:  .  ascorbic acid (VITAMIN C) 1000 MG tablet, Take 2,000 mg by mouth daily. , Disp: , Rfl:  .  docusate sodium (COLACE) 100 MG capsule, Take 100 mg by mouth daily., Disp: , Rfl:  .  fentaNYL (DURAGESIC) 12 MCG/HR, Place 1 patch onto the skin every 3 (three) days for 30 days., Disp: 10 patch, Rfl: 0 .  levothyroxine (SYNTHROID, LEVOTHROID) 25 MCG tablet, Take 25 mcg by mouth daily before breakfast., Disp: , Rfl:  .  lidocaine-prilocaine (EMLA) cream, Place small amt over port site 1 hour before each treatment, cover with saran wrap to protect clothing, Disp: 30 g, Rfl: 3 .   metoprolol tartrate (LOPRESSOR) 50 MG tablet, Take 50 mg by mouth 2 (two) times daily., Disp: , Rfl:  .  Oxycodone HCl 10 MG TABS, Take 1 tablet (10 mg total) by mouth every 4 (four) hours as needed., Disp: 180 tablet, Rfl: 0 .  pravastatin (PRAVACHOL) 20 MG tablet, Take 20 mg by mouth daily., Disp: , Rfl:  .  Probiotic Product (PROBIOTIC COMPLEX ACIDOPHILUS PO), Take 1 capsule by mouth daily. , Disp: , Rfl:  .  quinapril (ACCUPRIL) 40 MG tablet, Take 40 mg by mouth 1 day or 1 dose., Disp: , Rfl:  .  tamsulosin (FLOMAX) 0.4 MG CAPS capsule, Take 0.8 mg by mouth daily. , Disp: , Rfl:  .  omeprazole (PRILOSEC) 40 MG capsule, Take 1 capsule (40 mg total) by mouth 2 (two) times a day for 30 days., Disp: 60 capsule, Rfl: 0  Ct Chest W Contrast  Result Date: 09/06/2018 CLINICAL DATA:  Restaging esophageal cancer, rising CEA EXAM: CT CHEST, ABDOMEN, AND PELVIS WITH CONTRAST TECHNIQUE: Multidetector CT imaging of the chest, abdomen and pelvis was performed following the standard protocol during bolus administration of intravenous contrast. CONTRAST:  27m OMNIPAQUE IOHEXOL 300 MG/ML SOLN, additional oral enteric contrast COMPARISON:  06/23/2018 FINDINGS: CT CHEST FINDINGS Cardiovascular: Coronary artery and aortic valve calcifications. Normal heart size. No pericardial effusion. Mediastinum/Nodes: Enlarged hypodense subcarinal and paraesophageal lymph nodes, unchanged from prior. Thickening of the lower third of the esophagus and gastroesophageal junction. Lungs/Pleura: There is a new small pulmonary nodule of the left upper lobe measuring approximately 2 mm (series 3, image 75). New irregular subpleural pulmonary opacity of the lingula (series 3, image 97). Mild, bibasilar predominant pulmonary fibrosis. Small bilateral pleural effusions. Musculoskeletal: No chest wall mass or suspicious bone lesions identified. CT ABDOMEN PELVIS FINDINGS Hepatobiliary: There are multiple new hypodense liver lesions, largest in  the lateral right lobe measuring 2.0 cm (series 2, image 66). Status post cholecystectomy. Pancreas: Unremarkable. No pancreatic ductal dilatation or surrounding inflammatory changes. Spleen: Normal in size without focal abnormality. Adrenals/Urinary Tract: Adrenal glands are unremarkable. Kidneys are normal, without renal calculi, focal lesion, or hydronephrosis. Bladder is unremarkable. Stomach/Bowel: Stomach is within normal limits. No evidence of bowel wall thickening, distention, or inflammatory changes. Large burden of stool and stool balls in the colon. Vascular/Lymphatic: Mixed calcific atherosclerosis. Interval enlargement of gastrohepatic lymph nodes, a conglomerate measuring 4.5 x 2.5 cm, previously 4.0 x 2.0 cm when measured similarly. Numerous bulky retroperitoneal lymph nodes, likewise enlarged, index node measuring 1.9 cm, previously 1.5 cm (series 2, image 75). Reproductive: No mass or other abnormality. Other: No abdominal wall hernia or abnormality.  No abdominopelvic ascites. Musculoskeletal: No acute or significant osseous findings. IMPRESSION: 1. Redemonstrated thickening of the lower third of the esophagus and gastroesophageal junction, in keeping with known esophageal malignancy. 2. Interval enlargement of gastrohepatic lymph nodes, a conglomerate measuring 4.5 x 2.5 cm, previously 4.0 x 2.0 cm when measured similarly. Numerous bulky retroperitoneal lymph nodes, likewise enlarged, index node measuring 1.9 cm, previously 1.5 cm (series 2, image 75). There are multiple new hypodense liver lesions, largest in the lateral right lobe measuring 2.0 cm (series 2, image 66). Findings are consistent with progression of metastatic disease. 3. There is a new small pulmonary nodule of the left upper lobe measuring approximately 2 mm (series 3, image 75). New irregular subpleural pulmonary opacity of the lingula (series 3, image 97). These are nonspecific although suspicious for developing pulmonary  metastatic disease. Attention on follow-up. 4.  Chronic and incidental findings as detailed above. Electronically Signed   By: Eddie Candle M.D.   On: 09/06/2018 22:13   Ct Abdomen Pelvis W Contrast  Result Date: 09/06/2018 CLINICAL DATA:  Restaging esophageal cancer, rising CEA EXAM: CT CHEST, ABDOMEN, AND PELVIS WITH CONTRAST TECHNIQUE: Multidetector CT imaging of the chest, abdomen and pelvis was performed following the standard protocol during bolus administration of intravenous contrast. CONTRAST:  36m OMNIPAQUE IOHEXOL 300 MG/ML SOLN, additional oral enteric contrast COMPARISON:  06/23/2018 FINDINGS: CT CHEST FINDINGS Cardiovascular: Coronary artery and aortic valve calcifications. Normal heart size. No pericardial effusion. Mediastinum/Nodes: Enlarged hypodense subcarinal and paraesophageal lymph nodes, unchanged from prior. Thickening of the lower third of the esophagus and gastroesophageal junction. Lungs/Pleura: There is a new small pulmonary nodule of the left upper lobe measuring approximately 2 mm (series 3, image 75). New irregular subpleural pulmonary opacity of the lingula (series 3, image 97). Mild, bibasilar predominant pulmonary fibrosis. Small bilateral pleural effusions. Musculoskeletal: No chest wall mass or suspicious bone lesions identified. CT ABDOMEN PELVIS FINDINGS Hepatobiliary: There are multiple new hypodense liver lesions, largest in the lateral right lobe measuring 2.0 cm (series 2, image 66). Status post cholecystectomy. Pancreas: Unremarkable. No pancreatic ductal dilatation or surrounding inflammatory changes. Spleen: Normal in size without focal abnormality. Adrenals/Urinary Tract: Adrenal glands are unremarkable. Kidneys are normal, without renal calculi, focal lesion, or hydronephrosis. Bladder is unremarkable. Stomach/Bowel: Stomach is within normal limits. No evidence of bowel wall thickening, distention, or inflammatory changes. Large burden of stool and stool balls in  the colon. Vascular/Lymphatic: Mixed calcific atherosclerosis. Interval enlargement of gastrohepatic lymph nodes, a conglomerate measuring 4.5 x 2.5 cm, previously 4.0 x 2.0 cm when measured similarly. Numerous bulky retroperitoneal lymph nodes, likewise enlarged, index node measuring 1.9 cm, previously 1.5 cm (series 2, image 75). Reproductive: No mass or other abnormality. Other: No abdominal wall hernia or abnormality. No abdominopelvic ascites. Musculoskeletal: No acute or significant osseous findings. IMPRESSION: 1. Redemonstrated thickening of the lower third of the esophagus and gastroesophageal junction, in keeping with known esophageal malignancy. 2. Interval enlargement of gastrohepatic lymph nodes, a conglomerate measuring 4.5 x 2.5 cm, previously 4.0 x 2.0 cm when measured similarly. Numerous bulky retroperitoneal lymph nodes, likewise enlarged, index node measuring 1.9 cm, previously 1.5 cm (series 2, image 75). There are multiple new hypodense liver lesions, largest in the lateral right lobe measuring 2.0 cm (series 2, image 66). Findings are consistent with progression of metastatic disease. 3. There is a new small pulmonary nodule of the left upper lobe measuring approximately 2 mm (series 3, image 75). New irregular subpleural pulmonary opacity of the lingula (  series 3, image 97). These are nonspecific although suspicious for developing pulmonary metastatic disease. Attention on follow-up. 4.  Chronic and incidental findings as detailed above. Electronically Signed   By: Eddie Candle M.D.   On: 09/06/2018 22:13    No images are attached to the encounter.   CMP Latest Ref Rng & Units 09/01/2018  Glucose 70 - 99 mg/dL 135(H)  BUN 8 - 23 mg/dL 25(H)  Creatinine 0.61 - 1.24 mg/dL 1.31(H)  Sodium 135 - 145 mmol/L 138  Potassium 3.5 - 5.1 mmol/L 4.0  Chloride 98 - 111 mmol/L 104  CO2 22 - 32 mmol/L 23  Calcium 8.9 - 10.3 mg/dL 8.9  Total Protein 6.5 - 8.1 g/dL 6.6  Total Bilirubin 0.3 - 1.2  mg/dL 0.3  Alkaline Phos 38 - 126 U/L 47  AST 15 - 41 U/L 29  ALT 0 - 44 U/L 11   CBC Latest Ref Rng & Units 09/01/2018  WBC 4.0 - 10.5 K/uL 7.0  Hemoglobin 13.0 - 17.0 g/dL 7.9(L)  Hematocrit 39.0 - 52.0 % 24.6(L)  Platelets 150 - 400 K/uL 233     Observation/objective: appears in no acute distress over video visit today. Breathing is non labored  Assessment and plan: Patient is a 85 year old male with metastatic esophageal cancer with disease progression on carbotaxol radiation, FOLFOX and now on third line Keytruda.  He is here for CT scan discussion and further management  Patient CEA is going up exponentially from 298 at the previous value to 530 presently.  CT chest abdomen pelvis was done on 09/01/2018 which I have reviewed independently.  CT scan shows new liver metastases, enlargement of gastrohepatic lymph nodes but retroperitoneal adenopathy as well as small subcentimeter pulmonary nodules which are all concerning for disease progression.  He has not responded to [facility].  Discussed that at this time options are to pursue best supportive care/hospice versus attempting third line chemotherapy with single agent Taxol along with Cyramza.  Discussed that this regimen will add a few months but not years to his longevity.  Discussed possible side effects of chemotherapy including all but not limited to nausea, vomiting, low blood counts, risk of peripheral neuropathy.  Treatment will be given with a palliative intent.  Patient would like to think about both these options and he will get back to me in 2 days when he wishes to pursue active treatment versus hospice.  Pain during swallowing: CT chest and abdomen does not reveal any overt narrowing of his esophagus.  There is some degree of thickening noted at the site of prior cancer.  He is able to swallow soft foods such as boiled egg although he does report pain when he swallows.  Discussed we could get a modified barium swallow to see if  there is any evidence of esophageal stricture before attempting endoscopy.  Also discussed symptom management to better control his symptoms and I have increased omeprazole from 20 mg once a day to 40 mg twice daily.  We will also increase his fentanyl patch from 12 mcg to 25 mcg and he will continue to take PRN oxycodone as well.  I will make a follow-up phone call in 3 days time and see how his symptoms are doing and decide further management at that time.  Follow-up instructions: Follow-up phone call in 2 days time to discuss further management  I discussed the assessment and treatment plan with the patient. The patient was provided an opportunity to ask questions and all were  answered. The patient agreed with the plan and demonstrated an understanding of the instructions.   The patient was advised to call back or seek an in-person evaluation if the symptoms worsen or if the condition fails to improve as anticipated.    Visit Diagnosis: 1. Goals of care, counseling/discussion   2. Malignant neoplasm of lower third of esophagus (HCC)   3. Dysphagia, unspecified type     Dr. Randa Evens, MD, MPH [facility] at [facility] [phone] 09/11/2018 8:54 AM

## 2018-09-11 NOTE — Telephone Encounter (Signed)
Called patient to let him know that I have scheduled the barium swallow for tom. At 11 am. He will need [facility] at 10:45 to register first and then have procedure.  He can't eat or dink 3 hours prior to test. He is agreeable to this and I will send hospice paper for consult tom am. He is agreeable to the plan

## 2018-09-12 ENCOUNTER — Telehealth: Payer: Self-pay | Admitting: Oncology

## 2018-09-12 ENCOUNTER — Other Ambulatory Visit: Payer: Self-pay

## 2018-09-12 ENCOUNTER — Other Ambulatory Visit: Payer: Self-pay | Admitting: *Deleted

## 2018-09-12 ENCOUNTER — Ambulatory Visit
Admission: RE | Admit: 2018-09-12 | Discharge: 2018-09-12 | Disposition: A | Discharge status: Home / Self Care | Payer: Medicare Other | Source: Ambulatory Visit | Attending: Oncology | Admitting: Oncology

## 2018-09-12 DIAGNOSIS — C155 Malignant neoplasm of lower third of esophagus: Secondary | ICD-10-CM | POA: Diagnosis not present

## 2018-09-12 DIAGNOSIS — G893 Neoplasm related pain (acute) (chronic): Secondary | ICD-10-CM | POA: Insufficient documentation

## 2018-09-12 MED ORDER — LIDOCAINE VISCOUS HCL 2 % MT SOLN: 10.0000 mL | OROMUCOSAL | 1 refills | Status: AC | PRN

## 2018-09-12 NOTE — Telephone Encounter (Signed)
I spoke to Mr. Eddie Castaneda on 09/11/2018 for the follow-up on a conversation from 09/08/2018.  Patient continues to have significant pain during swallowing.  Reports that his appetite is pretty much nonexistent and he has not eaten much in the last 2 days.  I suspect this is all secondary to his malignancy.  We will see if there is a stricture or obstruction.  Patient does not desire endoscopy if possible but is agreeable to proceed with a barium swallow.  He will continue to take fentanyl 25 mcg along with PRN oxycodone. We discussed his wishes with regards to continuing systemic treatment versus pursuing home hospice.  Patient does not wish to pursue any active treatment at this time and is agreeable to considering hospice.  We will make a hospice referral at this time but still proceed with a barium swallow to see if there are any other means to palliate his symptoms besides pain medications.  Patient verbalized understanding  Dr. Randa Evens, MD, MPH [facility] at [facility] [phone] 09/12/2018 8:48 AM

## 2018-09-18 ENCOUNTER — Other Ambulatory Visit: Payer: Self-pay | Admitting: *Deleted

## 2018-09-18 MED ORDER — MORPHINE SULFATE ER 30 MG PO TBCR: 30.0000 mg | EXTENDED_RELEASE_TABLET | Freq: Two times a day (BID) | ORAL | 0 refills | Status: DC

## 2018-09-18 NOTE — Telephone Encounter (Signed)
Per VO Dr Janese Banks, change Fentanyl to MS ER 30 mg twice a day and will see how he is doing on Monday

## 2018-09-18 NOTE — Telephone Encounter (Signed)
Hospice nurse called to report that patient will need refill of his fentanyl patch before Monday and that he is using 2 Fentanyl 12 mcg patches currently and he is still rating his pain between a 4 and 9 Pain worse when he gets up. She reports that his is very skinny and wonders if he is absorbing medicine. Asking to increase his pain medicine or to change it to something else. He uses 6 Oxycodone 10 mg tabs per day for breakthrough pain. Please advise

## 2018-09-18 NOTE — Telephone Encounter (Signed)
Devana informed by leaving a voice mail on her phone to d/c the fentanyl and that we are sending in MS ER 30 mg twice a day and to call us back on Monday to see how he is doing

## 2018-09-23 ENCOUNTER — Other Ambulatory Visit: Payer: Self-pay | Admitting: *Deleted

## 2018-09-23 ENCOUNTER — Other Ambulatory Visit: Payer: Self-pay | Admitting: Hospice and Palliative Medicine

## 2018-09-23 MED ORDER — LORAZEPAM 0.5 MG PO TABS: 0.5000 mg | ORAL_TABLET | Freq: Three times a day (TID) | ORAL | 0 refills | Status: AC | PRN

## 2018-09-23 NOTE — Progress Notes (Addendum)
I called and spoke with his hospice nurse, Devana. Lorazepam 0.5mg  po Q8H prn for anxiety refill requested. Patient has been taking it at home with good effect.   PDMP reviewed.

## 2018-09-23 NOTE — Telephone Encounter (Signed)
Hospice called asking for order for Lorazepam to be sent in.

## 2018-09-23 NOTE — Telephone Encounter (Signed)
Can you put it in? I will sign it.

## 2018-09-23 NOTE — Telephone Encounter (Signed)
I spoke with nurse by phone. Refill for lorazepam sent.

## 2018-09-29 ENCOUNTER — Other Ambulatory Visit: Payer: Self-pay | Admitting: *Deleted

## 2018-09-29 MED ORDER — MORPHINE SULFATE ER 30 MG PO TBCR: 30.0000 mg | EXTENDED_RELEASE_TABLET | Freq: Two times a day (BID) | ORAL | 0 refills | Status: DC

## 2018-09-30 ENCOUNTER — Other Ambulatory Visit: Payer: Self-pay | Admitting: Hospice and Palliative Medicine

## 2018-09-30 ENCOUNTER — Telehealth: Payer: Self-pay

## 2018-09-30 MED ORDER — MORPHINE SULFATE ER 30 MG PO TBCR: 30.0000 mg | EXTENDED_RELEASE_TABLET | Freq: Three times a day (TID) | ORAL | 0 refills | Status: AC

## 2018-09-30 NOTE — Progress Notes (Signed)
I spoke with hospice nurse, Devana [phone]). Patient's MS Contin was increased to 30mg  Q8H by hospice NP and they are requesting a prescription for this change. Pain is reportedly much improved and patient is requiring minimal dosing of oxycodone for BTP

## 2018-09-30 NOTE — Telephone Encounter (Signed)
Lamar Blinks, RN with hospice called to request patient's MS Contin be increased to TID.  Notified Billey Chang, NP of the request.

## 2018-10-03 ENCOUNTER — Telehealth: Payer: Self-pay | Admitting: *Deleted

## 2018-10-03 MED ORDER — OXYCODONE HCL ER 30 MG PO T12A: 30.0000 mg | EXTENDED_RELEASE_TABLET | Freq: Two times a day (BID) | ORAL | 0 refills | Status: AC

## 2018-10-03 NOTE — Telephone Encounter (Signed)
Ok. 30 BID

## 2018-10-03 NOTE — Telephone Encounter (Signed)
Patient refusing to take any more MS ER 30 mg every 8 hours as it is making him very nauseated. Hospice nurse requesting prescription for Oxycontin to be called in for patient. Please advise

## 2018-10-04 ENCOUNTER — Other Ambulatory Visit: Payer: Self-pay | Admitting: Oncology

## 2018-10-13 ENCOUNTER — Telehealth: Payer: Self-pay | Admitting: *Deleted

## 2018-10-13 ENCOUNTER — Other Ambulatory Visit: Payer: Self-pay | Admitting: *Deleted

## 2018-10-13 NOTE — Telephone Encounter (Signed)
I called hospice because he was not taking it when he was seeing MD here. I will enter the drug and send to Janese Banks to sign. Morphine 30 ml bottle. Give 0.25 to 1 ml every 1 hours as needed for pain or SOB

## 2018-10-13 NOTE — Telephone Encounter (Signed)
Hospice called requesting refill for patients liquid morphine. I do not see this on his med list as of now. Please advise.

## 2018-10-14 MED ORDER — MORPHINE SULFATE (CONCENTRATE) 20 MG/ML PO SOLN: 5.0000 mg | ORAL | 0 refills | Status: AC

## 2018-10-15 ENCOUNTER — Telehealth: Payer: Self-pay | Admitting: *Deleted

## 2018-10-15 NOTE — Telephone Encounter (Signed)
I called and got in touch with hospice because the drug store sent a message that the message left on voicemail for [facility] was not complete and need to call back. Crystal sent a message to the nurse and she called me to say that she left her number if they had problems but because the message was cut off maybe the telephone number was cut off also. I told her I could call it in as long as she has the formulary to use for hospice. She states that it will be swish and swallow,5-10 ml qid and the formulary-benadryl, lidocaine and maalox. I called the pharmacy and I told them to make it as above for 2 week supply and it will be paid for by hospice. They will have it ready in 1 hour. I called [place] the nurse back and told her it will be ready in 1 hour

## 2018-10-22 DEATH — deceased

## 2018-10-31 ENCOUNTER — Other Ambulatory Visit: Payer: Self-pay | Admitting: Oncology

## 2019-12-19 IMAGING — CT CT CHEST W/ CM
2 of 5 series · 12 of 36 positions shown, 15 images · IV contrast (omnipaque)
Comparison: 03/04/2018

CLINICAL DATA: Esophageal cancer.  On chemotherapy.  Anemia.

EXAM:
CT CHEST, ABDOMEN, AND PELVIS WITH CONTRAST
TECHNIQUE: Multidetector CT imaging of the chest, abdomen and pelvis was
performed following the standard protocol during bolus
administration of intravenous contrast.
CONTRAST:  100mL OMNIPAQUE IOHEXOL 300 MG/ML  SOLN

[Series 2: axials cap · axial · 0.70mm/px · z∈[-1619,-1054]mm · 9 of 139 slices shown, 12 images]
[im 13/139  mediastinal]
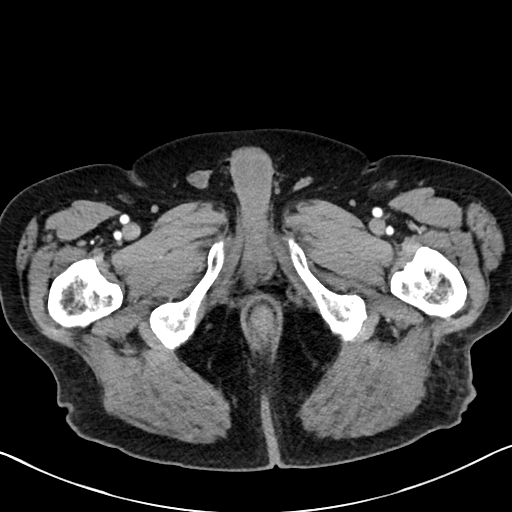
[im 13/139  lung]
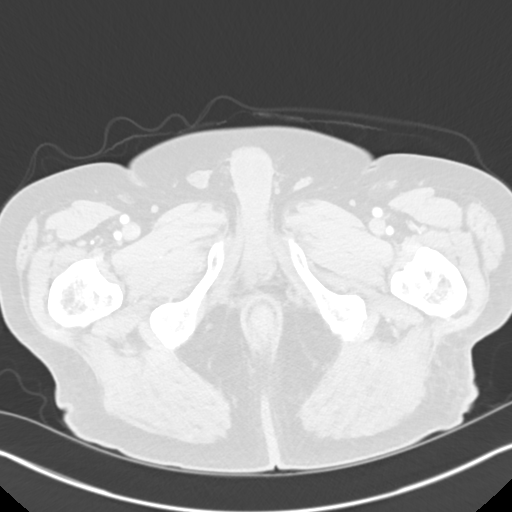
[im 26/139  lung]
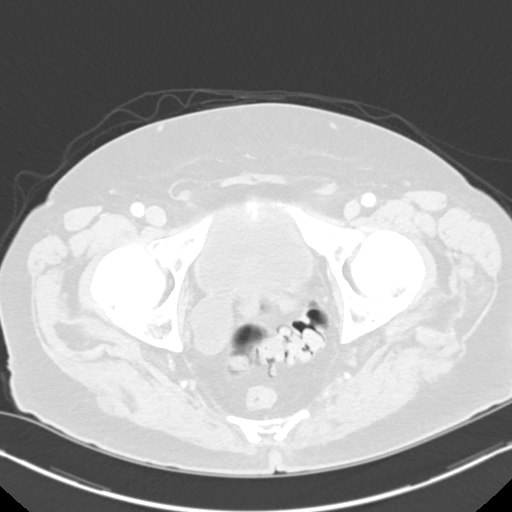
[im 38/139  lung]
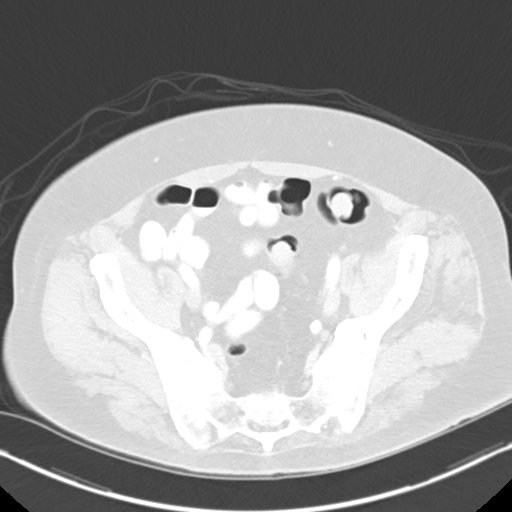
[im 51/139  lung]
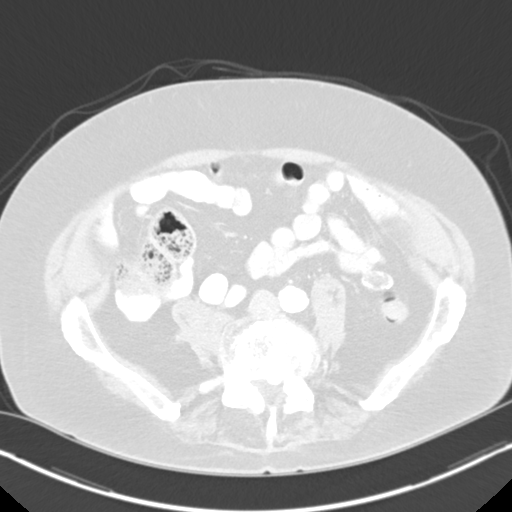
[im 76/139  mediastinal]
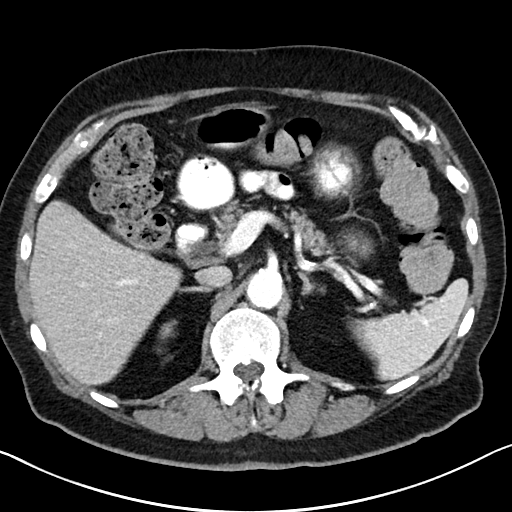
[im 76/139  lung]
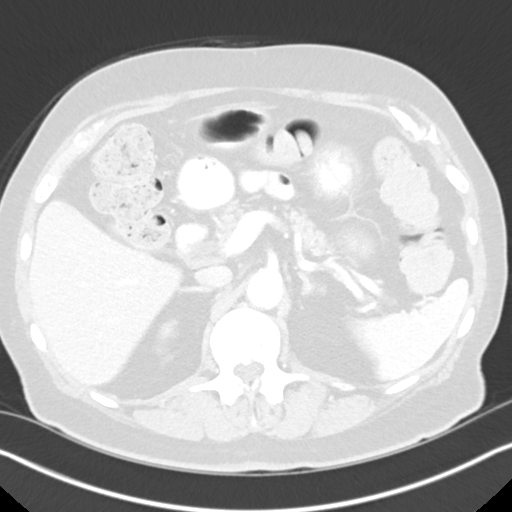
[im 88/139  lung]
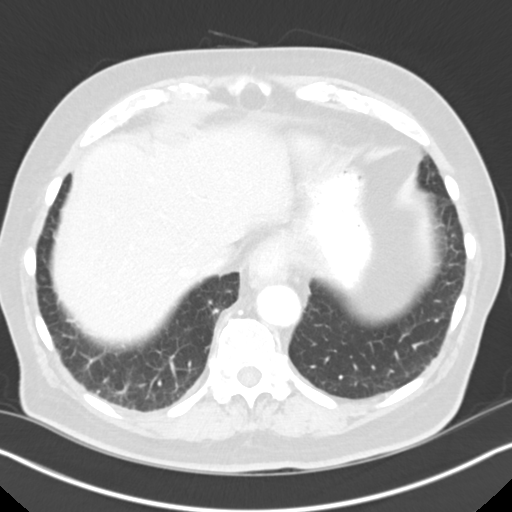
[im 101/139  lung]
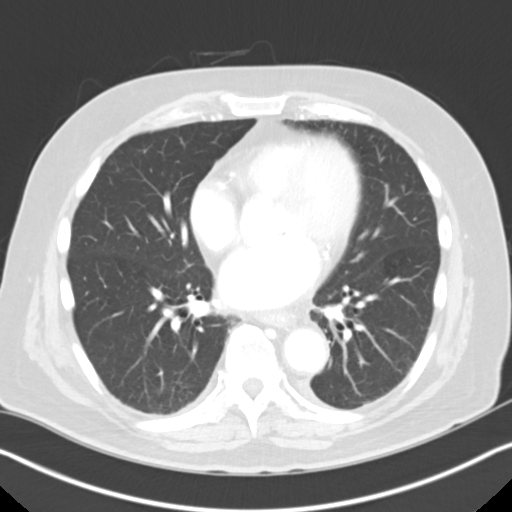
[im 113/139  lung]
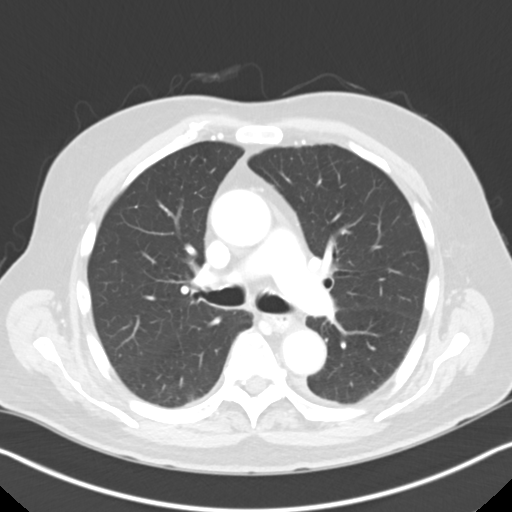
[im 126/139  mediastinal]
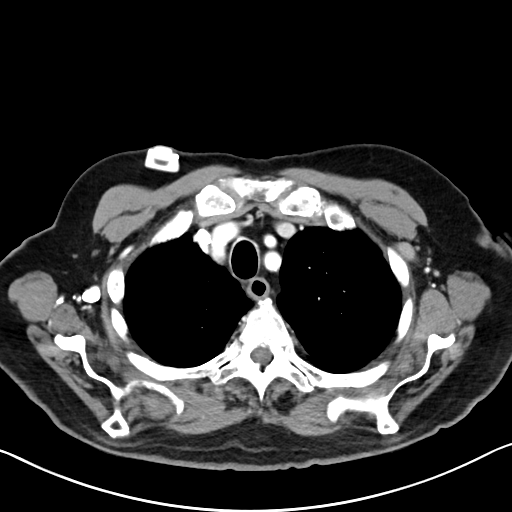
[im 126/139  lung]
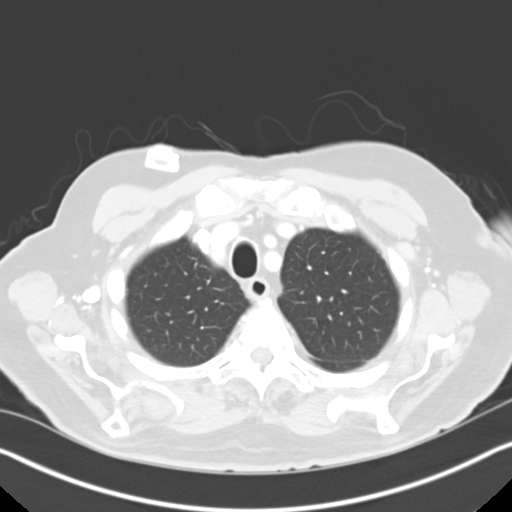

[Series 4: coronals cap · coronal · 0.70mm/px · 3 of 152 slices shown]
[im 31/152  lung]
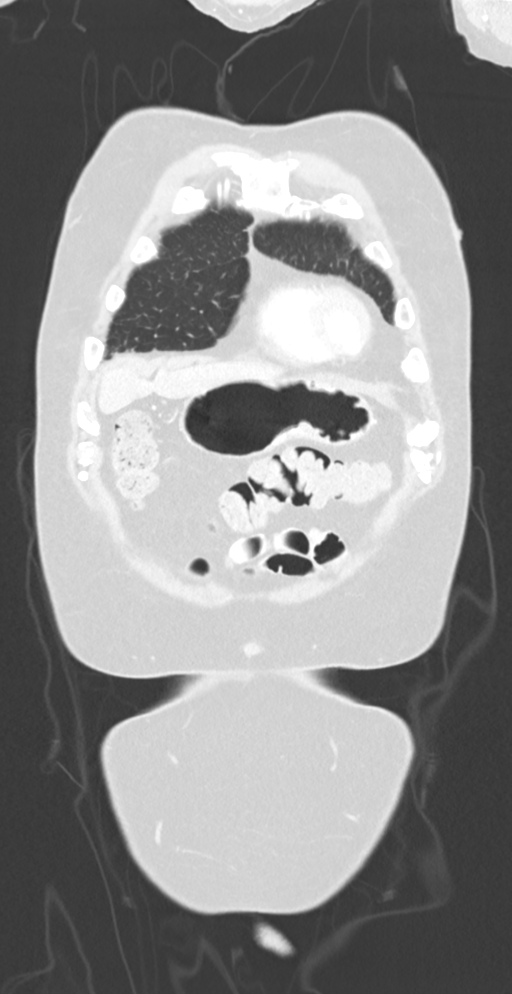
[im 61/152  lung]
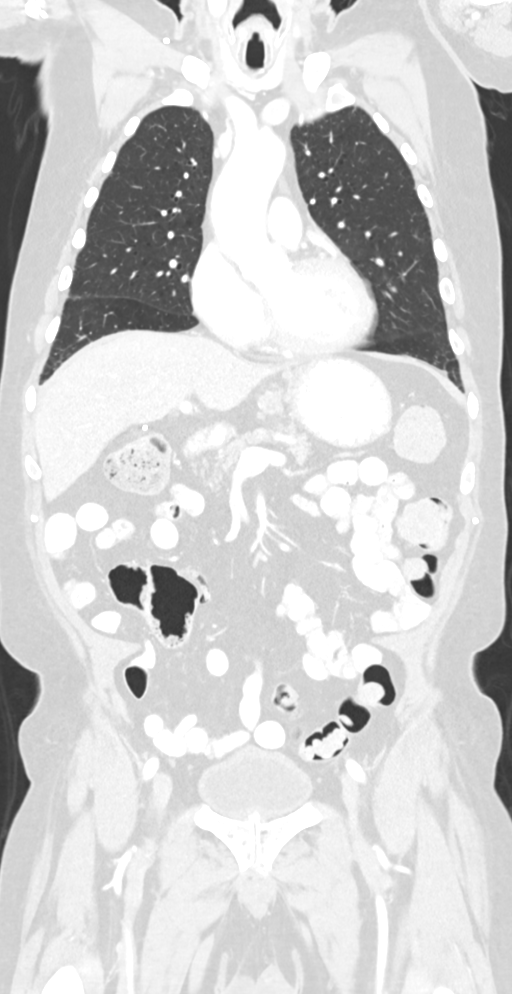
[im 91/152  lung]
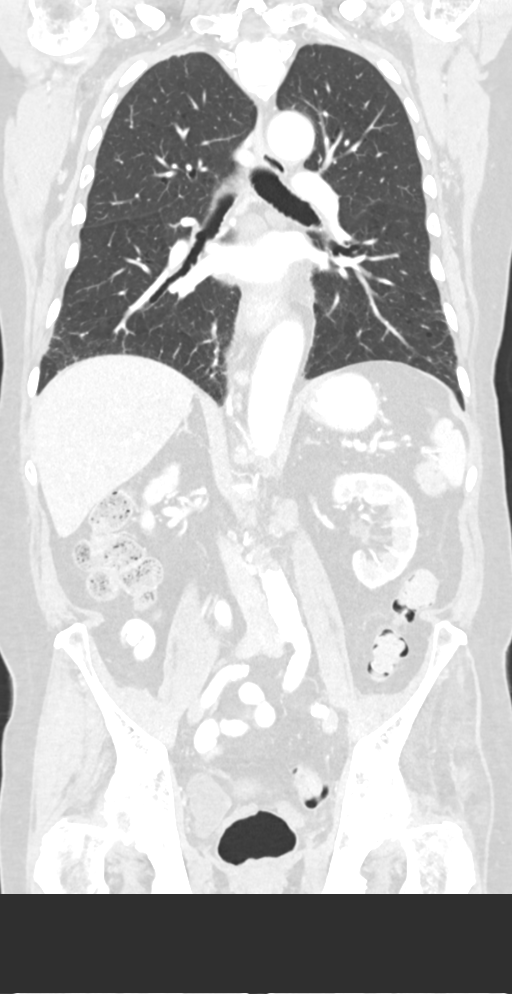

[12 of 36 positions shown; findings below may reference images not displayed]

FINDINGS: CT CHEST FINDINGS

Cardiovascular: Right Port-A-Cath tip at superior caval/atrial
junction. Aortic and branch vessel atherosclerosis. Normal heart
size, without pericardial effusion. No central pulmonary embolism,
on this non-dedicated study.

Mediastinum/Nodes: No supraclavicular adenopathy. No mediastinal or
hilar adenopathy. Small hiatal hernia. Distal esophageal wall
thickening is decreased. A periesophageal/retrocrural node measures
7 mm on image 54/2 and is enlarged from 5 mm on the prior.

Lungs/Pleura: Trace left pleural thickening medially, similar.

Mild centrilobular emphysema.

Tiny right upper and right lower lobe pulmonary nodules are similar
and may be calcified. Example in the right upper lobe on image 41/3
and right lower lobe on image 128/3.

Musculoskeletal: No acute osseous abnormality.

CT ABDOMEN PELVIS FINDINGS

Hepatobiliary: Normal liver. Cholecystectomy, without biliary ductal
dilatation.

Pancreas: Mild pancreatic atrophy, without duct dilatation or
dominant mass.

Spleen: Normal in size, without focal abnormality.

Adrenals/Urinary Tract: Normal adrenal glands. Mild right renal
cortical thinning. Normal left kidney, without hydronephrosis or
hydroureter. A right-sided bladder diverticulum is unchanged.

Stomach/Bowel: Normal distal stomach. Colonic stool burden suggests
constipation. Normal terminal ileum and appendix. Normal small
bowel.

Vascular/Lymphatic: Aortic and branch vessel atherosclerosis.
Retroperitoneal adenopathy. Index left periaortic node measures
cm on image 75/2. Compare 1.3 cm on the prior. More posterior left
periaortic node measures 1.5 cm on image 74/2 and is not
significantly changed.

Gastrohepatic ligament node measures 2.0 x 1.7 cm on image 59/2.
Similar to 2.1 x 1.7 cm on the prior.

A retrocaval node measures 5 mm on image 68/2 and is enlarged from 2
mm on the prior.

No pelvic sidewall adenopathy.

Reproductive: Normal prostate.  A small right-sided hydrocele.

Other: No significant free fluid.

Musculoskeletal: Degenerative partial fusion of the bilateral
sacroiliac joints. Lower lumbar spondylosis. S-shaped thoracolumbar
spine curvature.
IMPRESSION: 1. Mild disease progression compared to 03/04/2018. Although the
majority of enlarged abdominal nodes are similar, there are
enlarging nodes within the abdominal retroperitoneum and
retrocrural/lperiesophageal spaces of the lower chest.
2. Small hiatal hernia. Improved to resolved lower esophageal wall
thickening.
3. Tiny right-sided pulmonary nodules are unchanged, favored to
represent calcified granulomas.
4. Aortic atherosclerosis ([id number]) and emphysema ([id number]).
5. Right-sided bladder diverticulum.

## 2021-02-07 ENCOUNTER — Encounter: Payer: Self-pay | Admitting: Oncology
